# Patient Record
Sex: Female | Born: 1970 | Race: Black or African American | Hispanic: No | Marital: Single | Smoking: Never smoker
Health system: Southern US, Community
[De-identification: ages and names within clinical notes are randomized; demographics above are authoritative.]

## PROBLEM LIST (undated history)

## (undated) DIAGNOSIS — I471 Supraventricular tachycardia, unspecified: Secondary | ICD-10-CM

## (undated) DIAGNOSIS — I639 Cerebral infarction, unspecified: Secondary | ICD-10-CM

## (undated) DIAGNOSIS — K5790 Diverticulosis of intestine, part unspecified, without perforation or abscess without bleeding: Secondary | ICD-10-CM

## (undated) DIAGNOSIS — F419 Anxiety disorder, unspecified: Secondary | ICD-10-CM

## (undated) DIAGNOSIS — R002 Palpitations: Secondary | ICD-10-CM

## (undated) DIAGNOSIS — E210 Primary hyperparathyroidism: Secondary | ICD-10-CM

## (undated) DIAGNOSIS — I2510 Atherosclerotic heart disease of native coronary artery without angina pectoris: Secondary | ICD-10-CM

## (undated) DIAGNOSIS — D259 Leiomyoma of uterus, unspecified: Secondary | ICD-10-CM

## (undated) DIAGNOSIS — K8020 Calculus of gallbladder without cholecystitis without obstruction: Secondary | ICD-10-CM

## (undated) DIAGNOSIS — I209 Angina pectoris, unspecified: Secondary | ICD-10-CM

## (undated) DIAGNOSIS — D649 Anemia, unspecified: Secondary | ICD-10-CM

## (undated) DIAGNOSIS — N184 Chronic kidney disease, stage 4 (severe): Secondary | ICD-10-CM

## (undated) DIAGNOSIS — I517 Cardiomegaly: Secondary | ICD-10-CM

## (undated) DIAGNOSIS — N185 Chronic kidney disease, stage 5: Secondary | ICD-10-CM

## (undated) DIAGNOSIS — K219 Gastro-esophageal reflux disease without esophagitis: Secondary | ICD-10-CM

## (undated) DIAGNOSIS — K429 Umbilical hernia without obstruction or gangrene: Secondary | ICD-10-CM

## (undated) DIAGNOSIS — R0609 Other forms of dyspnea: Secondary | ICD-10-CM

## (undated) DIAGNOSIS — I219 Acute myocardial infarction, unspecified: Secondary | ICD-10-CM

## (undated) DIAGNOSIS — G4733 Obstructive sleep apnea (adult) (pediatric): Secondary | ICD-10-CM

## (undated) DIAGNOSIS — N289 Disorder of kidney and ureter, unspecified: Secondary | ICD-10-CM

## (undated) DIAGNOSIS — M6208 Separation of muscle (nontraumatic), other site: Secondary | ICD-10-CM

## (undated) DIAGNOSIS — Z7982 Long term (current) use of aspirin: Secondary | ICD-10-CM

## (undated) DIAGNOSIS — I10 Essential (primary) hypertension: Secondary | ICD-10-CM

## (undated) DIAGNOSIS — M1990 Unspecified osteoarthritis, unspecified site: Secondary | ICD-10-CM

## (undated) HISTORY — PX: ROTATOR CUFF REPAIR: SHX139

## (undated) HISTORY — PX: PARATHYROIDECTOMY: SHX19

---

## 2000-04-27 ENCOUNTER — Encounter: Payer: Self-pay | Admitting: Internal Medicine

## 2004-09-13 ENCOUNTER — Ambulatory Visit: Payer: Self-pay | Admitting: Internal Medicine

## 2004-09-20 ENCOUNTER — Ambulatory Visit: Payer: Self-pay | Admitting: Internal Medicine

## 2004-10-07 ENCOUNTER — Ambulatory Visit: Payer: Self-pay | Admitting: Internal Medicine

## 2004-10-19 ENCOUNTER — Other Ambulatory Visit: Admission: RE | Admit: 2004-10-19 | Discharge: 2004-10-19 | Payer: Self-pay | Admitting: Obstetrics & Gynecology

## 2004-10-20 ENCOUNTER — Other Ambulatory Visit: Admission: RE | Admit: 2004-10-20 | Discharge: 2004-10-20 | Payer: Self-pay | Admitting: Obstetrics & Gynecology

## 2004-10-26 ENCOUNTER — Ambulatory Visit: Payer: Self-pay | Admitting: Internal Medicine

## 2004-12-28 ENCOUNTER — Ambulatory Visit: Payer: Self-pay | Admitting: Internal Medicine

## 2004-12-30 ENCOUNTER — Ambulatory Visit: Payer: Self-pay | Admitting: Internal Medicine

## 2005-01-04 ENCOUNTER — Ambulatory Visit: Payer: Self-pay | Admitting: Internal Medicine

## 2005-02-09 ENCOUNTER — Ambulatory Visit: Payer: Self-pay | Admitting: Internal Medicine

## 2005-08-18 ENCOUNTER — Ambulatory Visit: Payer: Self-pay | Admitting: Endocrinology

## 2005-09-21 ENCOUNTER — Ambulatory Visit: Payer: Self-pay | Admitting: Internal Medicine

## 2005-09-27 ENCOUNTER — Ambulatory Visit: Payer: Self-pay | Admitting: Internal Medicine

## 2005-09-28 ENCOUNTER — Encounter (HOSPITAL_COMMUNITY): Admission: RE | Admit: 2005-09-28 | Discharge: 2005-12-27 | Payer: Self-pay | Admitting: Internal Medicine

## 2005-10-25 ENCOUNTER — Ambulatory Visit: Payer: Self-pay | Admitting: Internal Medicine

## 2005-12-07 ENCOUNTER — Ambulatory Visit: Payer: Self-pay | Admitting: Internal Medicine

## 2006-01-12 ENCOUNTER — Ambulatory Visit: Payer: Self-pay | Admitting: Internal Medicine

## 2006-11-19 LAB — CONVERTED CEMR LAB: Pap Smear: NORMAL

## 2006-11-24 ENCOUNTER — Encounter: Payer: Self-pay | Admitting: Internal Medicine

## 2006-11-24 DIAGNOSIS — D509 Iron deficiency anemia, unspecified: Secondary | ICD-10-CM | POA: Insufficient documentation

## 2006-11-24 DIAGNOSIS — I10 Essential (primary) hypertension: Secondary | ICD-10-CM | POA: Insufficient documentation

## 2006-12-21 ENCOUNTER — Emergency Department (HOSPITAL_COMMUNITY): Admission: EM | Admit: 2006-12-21 | Discharge: 2006-12-21 | Payer: Self-pay | Admitting: Emergency Medicine

## 2007-01-08 ENCOUNTER — Ambulatory Visit: Payer: Self-pay | Admitting: Internal Medicine

## 2007-01-08 LAB — CONVERTED CEMR LAB
BUN: 9 mg/dL (ref 6–23)
Basophils Absolute: 0 10*3/uL (ref 0.0–0.1)
Calcium: 10 mg/dL (ref 8.4–10.5)
Chloride: 109 meq/L (ref 96–112)
Creatinine, Ser: 1.1 mg/dL (ref 0.4–1.2)
Eosinophils Absolute: 0.6 10*3/uL (ref 0.0–0.6)
GFR calc non Af Amer: 60 mL/min
HCT: 26 % — ABNORMAL LOW (ref 36.0–46.0)
Hgb A1c MFr Bld: 5.4 % (ref 4.6–6.0)
MCHC: 30.7 g/dL (ref 30.0–36.0)
MCV: 63.7 fL — ABNORMAL LOW (ref 78.0–100.0)
Neutrophils Relative %: 50.2 % (ref 43.0–77.0)
Platelets: 541 10*3/uL — ABNORMAL HIGH (ref 150–400)
RBC: 4.08 M/uL (ref 3.87–5.11)
RDW: 22.7 % — ABNORMAL HIGH (ref 11.5–14.6)
Sodium: 147 meq/L — ABNORMAL HIGH (ref 135–145)
TSH: 0.38 microintl units/mL (ref 0.35–5.50)

## 2007-02-15 ENCOUNTER — Ambulatory Visit: Payer: Self-pay | Admitting: Internal Medicine

## 2007-02-15 LAB — CONVERTED CEMR LAB
Basophils Absolute: 0.1 10*3/uL (ref 0.0–0.1)
Basophils Relative: 0.9 % (ref 0.0–1.0)
CO2: 31 meq/L (ref 19–32)
Chloride: 112 meq/L (ref 96–112)
Creatinine, Ser: 1.2 mg/dL (ref 0.4–1.2)
HCT: 26 % — ABNORMAL LOW (ref 36.0–46.0)
Hemoglobin: 7.9 g/dL — ABNORMAL LOW (ref 12.0–15.0)
Monocytes Absolute: 0.6 10*3/uL (ref 0.2–0.7)
Neutrophils Relative %: 61.2 % (ref 43.0–77.0)
Potassium: 4.5 meq/L (ref 3.5–5.1)
RBC: 4.1 M/uL (ref 3.87–5.11)
RDW: 21.3 % — ABNORMAL HIGH (ref 11.5–14.6)
Sodium: 147 meq/L — ABNORMAL HIGH (ref 135–145)
WBC: 8.3 10*3/uL (ref 4.5–10.5)

## 2007-02-26 ENCOUNTER — Encounter (HOSPITAL_COMMUNITY): Admission: RE | Admit: 2007-02-26 | Discharge: 2007-05-10 | Payer: Self-pay | Admitting: Internal Medicine

## 2007-03-07 ENCOUNTER — Encounter (INDEPENDENT_AMBULATORY_CARE_PROVIDER_SITE_OTHER): Payer: Self-pay | Admitting: *Deleted

## 2007-04-12 ENCOUNTER — Ambulatory Visit: Payer: Self-pay | Admitting: Internal Medicine

## 2007-04-12 DIAGNOSIS — R05 Cough: Secondary | ICD-10-CM | POA: Insufficient documentation

## 2007-04-12 DIAGNOSIS — R059 Cough, unspecified: Secondary | ICD-10-CM | POA: Insufficient documentation

## 2007-04-15 LAB — CONVERTED CEMR LAB
Basophils Relative: 0.4 % (ref 0.0–1.0)
CO2: 30 meq/L (ref 19–32)
Eosinophils Absolute: 0.4 10*3/uL (ref 0.0–0.6)
Eosinophils Relative: 6.6 % — ABNORMAL HIGH (ref 0.0–5.0)
GFR calc Af Amer: 60 mL/min
GFR calc non Af Amer: 49 mL/min
Glucose, Bld: 90 mg/dL (ref 70–99)
Hemoglobin: 10 g/dL — ABNORMAL LOW (ref 12.0–15.0)
Lymphocytes Relative: 32.5 % (ref 12.0–46.0)
MCV: 67.5 fL — ABNORMAL LOW (ref 78.0–100.0)
Monocytes Absolute: 0.7 10*3/uL (ref 0.2–0.7)
Monocytes Relative: 10.8 % (ref 3.0–11.0)
Neutro Abs: 3.3 10*3/uL (ref 1.4–7.7)
Platelets: 452 10*3/uL — ABNORMAL HIGH (ref 150–400)
Potassium: 4.3 meq/L (ref 3.5–5.1)
WBC: 6.5 10*3/uL (ref 4.5–10.5)

## 2007-05-28 ENCOUNTER — Ambulatory Visit: Payer: Self-pay | Admitting: Internal Medicine

## 2007-05-28 DIAGNOSIS — N926 Irregular menstruation, unspecified: Secondary | ICD-10-CM | POA: Insufficient documentation

## 2007-05-29 LAB — CONVERTED CEMR LAB
Basophils Absolute: 0 10*3/uL (ref 0.0–0.1)
Basophils Relative: 0.3 % (ref 0.0–1.0)
MCHC: 30.8 g/dL (ref 30.0–36.0)
Monocytes Relative: 8.3 % (ref 3.0–11.0)
Platelets: 422 10*3/uL — ABNORMAL HIGH (ref 150–400)
RBC: 4.49 M/uL (ref 3.87–5.11)
RDW: 23.1 % — ABNORMAL HIGH (ref 11.5–14.6)
hCG, Beta Chain, Quant, S: <0.5 milliintl units/mL

## 2007-06-04 ENCOUNTER — Encounter (INDEPENDENT_AMBULATORY_CARE_PROVIDER_SITE_OTHER): Payer: Self-pay | Admitting: *Deleted

## 2007-07-08 ENCOUNTER — Telehealth (INDEPENDENT_AMBULATORY_CARE_PROVIDER_SITE_OTHER): Payer: Self-pay | Admitting: *Deleted

## 2007-10-15 ENCOUNTER — Observation Stay: Payer: Self-pay

## 2007-10-15 ENCOUNTER — Other Ambulatory Visit: Payer: Self-pay

## 2007-10-15 ENCOUNTER — Emergency Department: Payer: Self-pay | Admitting: Emergency Medicine

## 2008-06-29 ENCOUNTER — Ambulatory Visit: Payer: Self-pay | Admitting: Internal Medicine

## 2008-07-25 ENCOUNTER — Emergency Department: Payer: Self-pay | Admitting: Emergency Medicine

## 2008-07-30 ENCOUNTER — Ambulatory Visit: Payer: Self-pay | Admitting: Internal Medicine

## 2008-08-06 ENCOUNTER — Ambulatory Visit: Payer: Self-pay | Admitting: Internal Medicine

## 2008-08-29 ENCOUNTER — Ambulatory Visit: Payer: Self-pay | Admitting: Internal Medicine

## 2009-09-02 ENCOUNTER — Ambulatory Visit: Payer: Self-pay | Admitting: Gastroenterology

## 2009-11-17 ENCOUNTER — Ambulatory Visit: Payer: Self-pay | Admitting: Gastroenterology

## 2009-11-21 LAB — PATHOLOGY REPORT

## 2010-03-22 ENCOUNTER — Emergency Department: Payer: Self-pay

## 2011-04-04 ENCOUNTER — Ambulatory Visit: Payer: Self-pay

## 2012-06-18 ENCOUNTER — Ambulatory Visit: Payer: Self-pay

## 2012-06-26 ENCOUNTER — Ambulatory Visit: Payer: Self-pay

## 2013-11-10 ENCOUNTER — Ambulatory Visit: Payer: Self-pay

## 2015-09-13 ENCOUNTER — Encounter: Payer: Self-pay | Admitting: Medical Oncology

## 2015-09-13 ENCOUNTER — Observation Stay
Admission: EM | Admit: 2015-09-13 | Discharge: 2015-09-14 | Disposition: A | Discharge status: Home / Self Care | Payer: BLUE CROSS/BLUE SHIELD | Attending: Obstetrics and Gynecology | Admitting: Obstetrics and Gynecology

## 2015-09-13 DIAGNOSIS — N939 Abnormal uterine and vaginal bleeding, unspecified: Secondary | ICD-10-CM

## 2015-09-13 DIAGNOSIS — N946 Dysmenorrhea, unspecified: Secondary | ICD-10-CM | POA: Diagnosis not present

## 2015-09-13 DIAGNOSIS — D649 Anemia, unspecified: Secondary | ICD-10-CM | POA: Diagnosis present

## 2015-09-13 DIAGNOSIS — D509 Iron deficiency anemia, unspecified: Secondary | ICD-10-CM | POA: Diagnosis not present

## 2015-09-13 DIAGNOSIS — R05 Cough: Secondary | ICD-10-CM | POA: Diagnosis not present

## 2015-09-13 DIAGNOSIS — Z888 Allergy status to other drugs, medicaments and biological substances status: Secondary | ICD-10-CM | POA: Diagnosis not present

## 2015-09-13 DIAGNOSIS — Z6841 Body Mass Index (BMI) 40.0 and over, adult: Secondary | ICD-10-CM | POA: Insufficient documentation

## 2015-09-13 DIAGNOSIS — I10 Essential (primary) hypertension: Secondary | ICD-10-CM | POA: Insufficient documentation

## 2015-09-13 DIAGNOSIS — N920 Excessive and frequent menstruation with regular cycle: Principal | ICD-10-CM | POA: Insufficient documentation

## 2015-09-13 DIAGNOSIS — Z91013 Allergy to seafood: Secondary | ICD-10-CM | POA: Insufficient documentation

## 2015-09-13 DIAGNOSIS — D259 Leiomyoma of uterus, unspecified: Secondary | ICD-10-CM | POA: Diagnosis not present

## 2015-09-13 HISTORY — DX: Anemia, unspecified: D64.9

## 2015-09-13 HISTORY — DX: Essential (primary) hypertension: I10

## 2015-09-13 LAB — CBC
HCT: 22.2 % — ABNORMAL LOW (ref 35.0–47.0)
Hemoglobin: 6.4 g/dL — ABNORMAL LOW (ref 12.0–16.0)
MCH: 16.3 pg — ABNORMAL LOW (ref 26.0–34.0)
MCHC: 28.8 g/dL — ABNORMAL LOW (ref 32.0–36.0)
MCV: 56.5 fL — AB (ref 80.0–100.0)
PLATELETS: 418 10*3/uL (ref 150–440)
RBC: 3.93 MIL/uL (ref 3.80–5.20)
RDW: 22.6 % — AB (ref 11.5–14.5)
WBC: 5.8 10*3/uL (ref 3.6–11.0)

## 2015-09-13 LAB — URINALYSIS COMPLETE WITH MICROSCOPIC (ARMC ONLY)
Bilirubin Urine: NEGATIVE
Glucose, UA: NEGATIVE mg/dL
Ketones, ur: NEGATIVE mg/dL
LEUKOCYTES UA: NEGATIVE
NITRITE: NEGATIVE
PH: 5 (ref 5.0–8.0)
PROTEIN: 100 mg/dL — AB
SPECIFIC GRAVITY, URINE: 1.019 (ref 1.005–1.030)

## 2015-09-13 LAB — COMPREHENSIVE METABOLIC PANEL
ALBUMIN: 3.1 g/dL — AB (ref 3.5–5.0)
ALK PHOS: 91 U/L (ref 38–126)
ALT: 8 U/L — ABNORMAL LOW (ref 14–54)
ANION GAP: 6 (ref 5–15)
AST: 12 U/L — ABNORMAL LOW (ref 15–41)
BUN: 10 mg/dL (ref 6–20)
CALCIUM: 9.4 mg/dL (ref 8.9–10.3)
CO2: 28 mmol/L (ref 22–32)
Chloride: 102 mmol/L (ref 101–111)
Creatinine, Ser: 1.39 mg/dL — ABNORMAL HIGH (ref 0.44–1.00)
GFR calc non Af Amer: 45 mL/min — ABNORMAL LOW (ref >60)
GFR, EST AFRICAN AMERICAN: 53 mL/min — AB (ref >60)
GLUCOSE: 91 mg/dL (ref 65–99)
POTASSIUM: 3.1 mmol/L — AB (ref 3.5–5.1)
SODIUM: 136 mmol/L (ref 135–145)
TOTAL PROTEIN: 7.5 g/dL (ref 6.5–8.1)
Total Bilirubin: 0.1 mg/dL — ABNORMAL LOW (ref 0.3–1.2)

## 2015-09-13 LAB — PREPARE RBC (CROSSMATCH)

## 2015-09-13 LAB — POCT PREGNANCY, URINE: PREG TEST UR: NEGATIVE

## 2015-09-13 LAB — PROTIME-INR
INR: 1.07
PROTHROMBIN TIME: 14.1 s (ref 11.4–15.0)

## 2015-09-13 LAB — ABO/RH: ABO/RH(D): O POS

## 2015-09-13 MED ORDER — ACETAMINOPHEN 500 MG PO TABS
500.0000 mg | ORAL_TABLET | ORAL | Status: DC | PRN
  Administered 2015-09-13 – 2015-09-14 (×2): 500 mg via ORAL
  Filled 2015-09-13 (×2): qty 1

## 2015-09-13 MED ORDER — SODIUM CHLORIDE 0.9 % IV SOLN
10.0000 mL/h | Freq: Once | INTRAVENOUS | Status: AC
  Administered 2015-09-13: 10 mL/h via INTRAVENOUS

## 2015-09-13 MED ORDER — LACTATED RINGERS IV SOLN: INTRAVENOUS | Status: DC

## 2015-09-13 MED ORDER — ONDANSETRON HCL 4 MG PO TABS: 4.0000 mg | ORAL_TABLET | Freq: Four times a day (QID) | ORAL | Status: DC | PRN

## 2015-09-13 MED ORDER — NORGESTREL-ETHINYL ESTRADIOL 0.5-50 MG-MCG PO TABS
1.0000 | ORAL_TABLET | Freq: Three times a day (TID) | ORAL | Status: DC
  Administered 2015-09-13 – 2015-09-14 (×3): 1 via ORAL
  Filled 2015-09-13 (×5): qty 1

## 2015-09-13 MED ORDER — METOPROLOL TARTRATE 25 MG PO TABS
25.0000 mg | ORAL_TABLET | Freq: Two times a day (BID) | ORAL | Status: DC
  Administered 2015-09-13 – 2015-09-14 (×2): 25 mg via ORAL
  Filled 2015-09-13 (×2): qty 1

## 2015-09-13 MED ORDER — ONDANSETRON HCL 4 MG/2ML IJ SOLN: 4.0000 mg | Freq: Four times a day (QID) | INTRAMUSCULAR | Status: DC | PRN

## 2015-09-13 MED ORDER — LOSARTAN POTASSIUM 50 MG PO TABS
100.0000 mg | ORAL_TABLET | Freq: Every day | ORAL | Status: DC
  Administered 2015-09-14: 100 mg via ORAL
  Filled 2015-09-13 (×2): qty 2

## 2015-09-13 MED ORDER — PRENATAL MULTIVITAMIN CH
1.0000 | ORAL_TABLET | Freq: Every day | ORAL | Status: DC
  Administered 2015-09-14: 1 via ORAL
  Filled 2015-09-13: qty 1

## 2015-09-13 MED ORDER — DIPHENHYDRAMINE HCL 25 MG PO CAPS
25.0000 mg | ORAL_CAPSULE | ORAL | Status: DC | PRN
  Administered 2015-09-13 – 2015-09-14 (×2): 25 mg via ORAL
  Filled 2015-09-13 (×2): qty 1

## 2015-09-13 NOTE — ED Notes (Signed)
Pt given meal tray per okay from MD Mcshane, sitting up in bed eating and tolerating well, NAD noted at this time, will continue to monitor.

## 2015-09-13 NOTE — ED Provider Notes (Signed)
[facility] Emergency Department Provider Note  ____________________________________________   I have reviewed the triage vital signs and the nursing notes.   HISTORY  Chief Complaint Vaginal Bleeding    HPI [name] is a 45 y.o. female presents today with vaginal bleeding. Patient has a history of heavy vaginal bleeding and transfusions in the past, she believes the most recent time was 2009. She usually has periods once a month and her last missed her period was at the beginning of May. They usually last for 3 days but are very heavy. She states that atypically, she began to have vaginal bleeding 2 or 3 days ago and last night it was heavy with gushes of blood, bleeding continues with passage of clots. Has used 4 pads so far today but also feels somewhat lightheaded after her heavy bleeding last night. Patient has a history of iron deficiency anemia, irregular menses in the past      Past Medical History  Diagnosis Date  . Hypertension   . Anemia     Patient Active Problem List   Diagnosis Date Noted  . IRREGULAR MENSES 05/28/2007  . COUGH 04/12/2007  . OBESITY, MORBID 11/24/2006  . ANEMIA-IRON DEFICIENCY 11/24/2006  . HYPERTENSION 11/24/2006    History reviewed. No pertinent past surgical history.  No current outpatient prescriptions on file.  Allergies Ace inhibitors and Latex  No family history on file.  Social History Social History  Substance Use Topics  . Smoking status: Never Smoker   . Smokeless tobacco: None  . Alcohol Use: None    Review of Systems Constitutional: No fever/chills Eyes: No visual changes. ENT: No sore throat. No stiff neck no neck pain Cardiovascular: Denies chest pain. Respiratory: Denies shortness of breath. Gastrointestinal:   no vomiting.  No diarrhea.  No constipation. Genitourinary: Negative for dysuria. Musculoskeletal: Negative lower extremity swelling Skin: Negative for  rash. Neurological: Negative for headaches, focal weakness or numbness. 10-point ROS otherwise negative.  ____________________________________________   PHYSICAL EXAM:  VITAL SIGNS: ED Triage Vitals  Enc Vitals Group     BP 09/13/15 1708 153/90 mmHg     Pulse Rate 09/13/15 1708 100     Resp 09/13/15 1708 15     Temp 09/13/15 1708 99 F (37.2 C)     Temp Source 09/13/15 1708 Oral     SpO2 09/13/15 1708 100 %     Weight 09/13/15 1708 250 lb (113.399 kg)     Height 09/13/15 1708 5\' 3"  (1.6 m)     Head Cir --      Peak Flow --      Pain Score 09/13/15 1717 2     Pain Loc --      Pain Edu? --      Excl. in [place]? --     Constitutional: Alert and oriented. Well appearing and in no acute distress. Eyes: Conjunctivae are Pale. PERRL. EOMI. Head: Atraumatic. Nose: No congestion/rhinnorhea. Mouth/Throat: Mucous membranes are moist.  Oropharynx non-erythematous. Neck: No stridor.   Nontender with no meningismus Cardiovascular: Normal rate, regular rhythm. Grossly normal heart sounds.  Good peripheral circulation. Respiratory: Normal respiratory effort.  No retractions. Lungs CTAB. Abdominal: Soft and nontender. No distention. No guarding no rebound Back:  There is no focal tenderness or step off there is no midline tenderness there are no lesions noted. there is no CVA tenderness Pelvic exam: Female nurse chaperone present, no external lesions noted, physiologic vaginal discharge noted with no purulent discharge, no cervical motion  tenderness, no adnexal tenderness or mass, there is no significant uterine tenderness or mass. Active moderate vaginal bleeding noted with dark blood Musculoskeletal: No lower extremity tenderness. No joint effusions, no DVT signs strong distal pulses no edema Neurologic:  Normal speech and language. No gross focal neurologic deficits are appreciated.  Skin:  Skin is warm, dry and intact. No rash noted. Psychiatric: Mood and affect are normal. Speech and  behavior are normal.  ____________________________________________   LABS (all labs ordered are listed, but only abnormal results are displayed)  Labs Reviewed  COMPREHENSIVE METABOLIC PANEL - Abnormal; Notable for the following:    Potassium 3.1 (*)    Creatinine, Ser 1.39 (*)    Albumin 3.1 (*)    AST 12 (*)    ALT 8 (*)    Total Bilirubin 0.1 (*)    GFR calc non Af Amer 45 (*)    GFR calc Af Amer 53 (*)    All other components within normal limits  CBC - Abnormal; Notable for the following:    Hemoglobin 6.4 (*)    HCT 22.2 (*)    MCV 56.5 (*)    MCH 16.3 (*)    MCHC 28.8 (*)    RDW 22.6 (*)    All other components within normal limits  URINALYSIS COMPLETEWITH MICROSCOPIC ([facility] ONLY) - Abnormal; Notable for the following:    Bacteria, UA RARE (*)    Squamous Epithelial / LPF 0-5 (*)    All other components within normal limits  PROTIME-INR  POC URINE PREG, ED  POCT PREGNANCY, URINE  TYPE AND SCREEN  ABO/RH   ____________________________________________  EKG  I personally interpreted any EKGs ordered by me or triage  ____________________________________________  [id number]  I reviewed any imaging ordered by me or triage that were performed during my shift and, if possible, patient and/or family made aware of any abnormal findings. ____________________________________________   PROCEDURES  Procedure(s) performed: None  Critical Care performed: None  ____________________________________________   INITIAL IMPRESSION / ASSESSMENT AND PLAN / ED COURSE  Pertinent labs & imaging results that were available during my care of the patient were reviewed by me and considered in my medical decision making (see chart for details).  Discussed with Dr. Ouida Sills, who was kind enough to take the consult. He will admit the patient for dysfunction uterine bleeding and anemia. ____________________________________________   FINAL CLINICAL IMPRESSION(S) / ED  DIAGNOSES  Final diagnoses:  None      This chart was dictated using voice recognition software.  Despite best efforts to proofread,  errors can occur which can change meaning.     Schuyler Amor, MD 09/13/15 [phone]

## 2015-09-13 NOTE — ED Notes (Signed)
Pt reports she had her period last month until the 3rd of may, 2 days ago pt reports she started bleeding again. Pt reports today she has saturated through 3 pads. Pt reports hx of uterine fibroids.

## 2015-09-13 NOTE — ED Notes (Signed)
OB MD at bedside at this time

## 2015-09-13 NOTE — ED Notes (Signed)
Pt reports having food poisoning two weeks ago and reports symptoms regarding decreased appetite and abd pain have not decreased since that time.

## 2015-09-14 ENCOUNTER — Observation Stay: Payer: BLUE CROSS/BLUE SHIELD

## 2015-09-14 LAB — BASIC METABOLIC PANEL
Anion gap: 5 (ref 5–15)
BUN: 14 mg/dL (ref 6–20)
CALCIUM: 9.4 mg/dL (ref 8.9–10.3)
CHLORIDE: 104 mmol/L (ref 101–111)
CO2: 29 mmol/L (ref 22–32)
CREATININE: 1.36 mg/dL — AB (ref 0.44–1.00)
GFR calc Af Amer: 54 mL/min — ABNORMAL LOW (ref >60)
GFR calc non Af Amer: 47 mL/min — ABNORMAL LOW (ref >60)
GLUCOSE: 91 mg/dL (ref 65–99)
Potassium: 3.2 mmol/L — ABNORMAL LOW (ref 3.5–5.1)
Sodium: 138 mmol/L (ref 135–145)

## 2015-09-14 LAB — CBC
HCT: 27.4 % — ABNORMAL LOW (ref 35.0–47.0)
HCT: 28.1 % — ABNORMAL LOW (ref 35.0–47.0)
HEMOGLOBIN: 8.7 g/dL — AB (ref 12.0–16.0)
Hemoglobin: 8.5 g/dL — ABNORMAL LOW (ref 12.0–16.0)
MCH: 19.6 pg — ABNORMAL LOW (ref 26.0–34.0)
MCH: 19.4 pg — AB (ref 26.0–34.0)
MCHC: 30.8 g/dL — ABNORMAL LOW (ref 32.0–36.0)
MCHC: 31 g/dL — ABNORMAL LOW (ref 32.0–36.0)
MCV: 63.7 fL — ABNORMAL LOW (ref 80.0–100.0)
MCV: 62.5 fL — AB (ref 80.0–100.0)
PLATELETS: 375 10*3/uL (ref 150–440)
Platelets: 380 10*3/uL (ref 150–440)
RBC: 4.31 MIL/uL (ref 3.80–5.20)
RBC: 4.49 MIL/uL (ref 3.80–5.20)
RDW: 27.6 % — ABNORMAL HIGH (ref 11.5–14.5)
RDW: 27.5 % — ABNORMAL HIGH (ref 11.5–14.5)
WBC: 7.4 10*3/uL (ref 3.6–11.0)
WBC: 8.5 10*3/uL (ref 3.6–11.0)

## 2015-09-14 MED ORDER — NORETHINDRONE-ETH ESTRADIOL 1-35 MG-MCG PO TABS: 1.0000 | ORAL_TABLET | Freq: Every day | ORAL | Status: DC

## 2015-09-14 MED ORDER — FERROUS SULFATE 325 (65 FE) MG PO TABS: 325.0000 mg | ORAL_TABLET | Freq: Two times a day (BID) | ORAL | Status: DC

## 2015-09-14 MED ORDER — LOSARTAN POTASSIUM-HCTZ 100-25 MG PO TABS: 1.0000 | ORAL_TABLET | Freq: Every day | ORAL | Status: DC

## 2015-09-14 MED ORDER — METOPROLOL TARTRATE 25 MG PO TABS: 25.0000 mg | ORAL_TABLET | Freq: Two times a day (BID) | ORAL | Status: DC

## 2015-09-14 MED ORDER — DOCUSATE SODIUM 100 MG PO CAPS: 100.0000 mg | ORAL_CAPSULE | Freq: Two times a day (BID) | ORAL | Status: DC

## 2015-09-14 MED ORDER — ONDANSETRON HCL 4 MG PO TABS: 4.0000 mg | ORAL_TABLET | Freq: Four times a day (QID) | ORAL | Status: DC | PRN

## 2015-09-14 NOTE — Progress Notes (Signed)
Spoke to Dr. Ouida Sills to report CBC and Ultrasound results---he is coming in to see patient this afternoon.

## 2015-09-14 NOTE — H&P (Signed)
[name], [name] NO.:  [id number]  MEDICAL RECORD NO.:  [id number]  LOCATION:  [id number]                         FACILITY:  [facility]  PHYSICIAN:  [name], MDDATE OF BIRTH:  1971/03/31  DATE OF ADMISSION:  09/13/2015 DATE OF DISCHARGE:                            HISTORY AND PHYSICAL   HISTORY OF PRESENT ILLNESS:  This is a 45 year old, gravida 0, patient presented to the emergency department today with heavy vaginal bleeding and significant dysmenorrhea.  The patient has a long history of menorrhagia with monthly cycles and bleeds for 3-4 days with excessive bleeding, passing large clots.  The patient has been worked up back in 2006 through 2008, for menorrhagia and records not available.  She has received IV iron in the past for her heavy menstruation.  Dr. Bayard Males- [place] [facility] was the last gynecologist in [place] to evaluate her.  Again, no records available.  Laboratory studies in the emergency department, hemoglobin of 6.4, hematocrit 22.2%.  The patient is not sexually active and is not using any form of contraception.  PAST MEDICAL HISTORY: 1. Hypertension uncontrolled.  The patient states that she has been     written for losartan 100 mg daily and metoprolol 25 mg twice a day     in the past.  Currently, not taking these medications. 2. Morbid obesity.  PAST SURGICAL HISTORY:  Colonoscopy and endoscopy.  REVIEW OF SYSTEMS:  Unremarkable except for: GYNECOLOGIC:  Menorrhagia, dysmenorrhea. GENERAL:  Morbid obesity.  SOCIAL HISTORY:  Does not smoke.  Does not drink.  ALLERGIES:  LISINOPRIL, LASIX, SHRIMP.  PHYSICAL EXAMINATION:  GENERAL:  A well-developed, well-nourished, morbidly obese, black female, no acute distress. VITAL SIGNS:  Blood pressure 179/89. LUNGS:  Clear to auscultation. CARDIOVASCULAR:  Regular rate and rhythm. ABDOMEN:  Soft, nontender. PELVIC:  Deferred.  ASSESSMENT: 1. Menorrhagia. 2. Severe anemia resulting from  menorrhagia. 3. Uncontrolled hypertension.  PLAN:  The patient will be admitted to [facility] and we will start 3 unit blood transfusion in the goal of trying to get her blood count above 30, so that she can be worked up for potential surgery as an outpatient.  The patient will be started on Ogestrel 0.5/50 one tablet 3 times a day, Zofran 4 mg oral or IV q.6 hours while taking the high-dose birth control pill.  The patient will undergo vaginal ultrasound tomorrow in the a.m. to give a better assessment of uterus and probable fibroid.  Repeat laboratory testing in the morning.          ______________________________ [name], MD     TS/MEDQ  D:  09/13/2015  T:  09/14/2015  Job:  [id number]

## 2015-09-14 NOTE — Progress Notes (Signed)
D/C order from MD.  Reviewed d/c instructions and prescriptions with patient and answered any questions.  Patient d/c home.

## 2015-09-14 NOTE — Discharge Instructions (Signed)
Take prescriptions as advised by Dr. Ouida Sills and keep your follow up appointment.  Call your doctors office if you have any questions or concerns.

## 2015-09-14 NOTE — Discharge Summary (Signed)
Physician Discharge Summary  Patient ID: [name] MRN: [id number] DOB/AGE: 1970-09-06 45 y.o.  Admit date: 09/13/2015 Discharge date: 09/14/2015  Admission Diagnoses:  Discharge Diagnoses:  Active Problems:   Severe anemia uncontrolled HTN   Discharged Condition: good  Hospital Course: pt admitted with heavy vaginal bleeding and severe anemia with HCT of 22 %. Admitted and received 3 unit PRBC transfusion . Post transfusion 28%   Consults: None  Significant Diagnostic Studies: radiology: FINDINGS:15 x 10 cm utx with multiple fibroids .  Treatments: 3 unit PRBC transfusion   Discharge Exam: Blood pressure 140/69, pulse 58, temperature 98.2 F (36.8 C), temperature source Oral, resp. rate 18, height 5\' 3"  (1.6 m), weight 250 lb (113.399 kg), last menstrual period 09/11/2015, SpO2 99 %. Resp: clear to auscultation bilaterally Cardio: regular rate and rhythm, S1, S2 normal, no murmur, click, rub or gallop GI: soft, non-tender; bowel sounds normal; no masses,  no organomegaly  Disposition: Final discharge disposition not confirmed  Pt will f/up at [facility] clinic for work up for hysterectomy   Discharge Instructions    Call MD for:  difficulty breathing, headache or visual disturbances    Complete by:  As directed      Call MD for:  extreme fatigue    Complete by:  As directed      Call MD for:  hives    Complete by:  As directed      Call MD for:  persistant dizziness or light-headedness    Complete by:  As directed      Call MD for:  persistant nausea and vomiting    Complete by:  As directed      Call MD for:  redness, tenderness, or signs of infection (pain, swelling, redness, odor or green/yellow discharge around incision site)    Complete by:  As directed      Call MD for:  severe uncontrolled pain    Complete by:  As directed      Call MD for:  temperature >100.4    Complete by:  As directed      Diet - low sodium heart healthy    Complete by:  As directed      Increase activity slowly    Complete by:  As directed             Medication List    TAKE these medications        docusate sodium 100 MG capsule  Commonly known as:  COLACE  Take 1 capsule (100 mg total) by mouth 2 (two) times daily.     ferrous sulfate 325 (65 FE) MG tablet  Take 325 mg by mouth 3 (three) times daily with meals.     ferrous sulfate 325 (65 FE) MG tablet  Commonly known as:  FERROUSUL  Take 1 tablet (325 mg total) by mouth 2 (two) times daily with a meal.     losartan-hydrochlorothiazide 100-25 MG tablet  Commonly known as:  HYZAAR  Take 1 tablet by mouth daily.     losartan-hydrochlorothiazide 100-25 MG tablet  Commonly known as:  HYZAAR  Take 1 tablet by mouth daily.     metoprolol tartrate 25 MG tablet  Commonly known as:  LOPRESSOR  Take 25 mg by mouth 2 (two) times daily.     metoprolol tartrate 25 MG tablet  Commonly known as:  LOPRESSOR  Take 1 tablet (25 mg total) by mouth 2 (two) times daily.     norethindrone-ethinyl estradiol 1/35 tablet  Commonly known as:  ORTHO-NOVUM 1/35 (28)  Take 1 tablet by mouth daily. 1 po tid x 3 days then 1 po bid x 2 days then qd . Dispose of the last 7 tabs and restart new pack     ondansetron 4 MG tablet  Commonly known as:  ZOFRAN  Take 1 tablet (4 mg total) by mouth every 6 (six) hours as needed for nausea.           Follow-up Information    Follow up with Madaleine Simmon, MD In 2 weeks.   Specialty:  Obstetrics and Gynecology   Why:  continued work up for menorrhagia and fibroid utx    Contact information:   [address] [place] [address] [phone]       Signed: [name] 09/14/2015, 4:38 PM

## 2015-09-16 LAB — TYPE AND SCREEN
ABO/RH(D): O POS
Antibody Screen: NEGATIVE
UNIT DIVISION: 0
UNIT DIVISION: 0
Unit division: 0

## 2015-09-30 ENCOUNTER — Other Ambulatory Visit: Payer: Self-pay | Admitting: Internal Medicine

## 2015-09-30 DIAGNOSIS — R1011 Right upper quadrant pain: Secondary | ICD-10-CM

## 2015-10-04 ENCOUNTER — Ambulatory Visit: Payer: BLUE CROSS/BLUE SHIELD

## 2016-06-14 ENCOUNTER — Encounter: Payer: Self-pay | Admitting: Emergency Medicine

## 2016-06-14 ENCOUNTER — Emergency Department
Admission: EM | Admit: 2016-06-14 | Discharge: 2016-06-14 | Disposition: A | Discharge status: Home / Self Care | Payer: Self-pay | Attending: Emergency Medicine | Admitting: Emergency Medicine

## 2016-06-14 ENCOUNTER — Emergency Department: Payer: Self-pay

## 2016-06-14 DIAGNOSIS — I10 Essential (primary) hypertension: Secondary | ICD-10-CM | POA: Insufficient documentation

## 2016-06-14 DIAGNOSIS — Z79899 Other long term (current) drug therapy: Secondary | ICD-10-CM | POA: Insufficient documentation

## 2016-06-14 DIAGNOSIS — N83299 Other ovarian cyst, unspecified side: Secondary | ICD-10-CM

## 2016-06-14 DIAGNOSIS — N83201 Unspecified ovarian cyst, right side: Secondary | ICD-10-CM | POA: Insufficient documentation

## 2016-06-14 LAB — URINALYSIS, COMPLETE (UACMP) WITH MICROSCOPIC
Bacteria, UA: NONE SEEN
Bilirubin Urine: NEGATIVE
GLUCOSE, UA: NEGATIVE mg/dL
Ketones, ur: NEGATIVE mg/dL
LEUKOCYTES UA: NEGATIVE
NITRITE: NEGATIVE
PH: 5 (ref 5.0–8.0)
Protein, ur: >=300 mg/dL — AB
SPECIFIC GRAVITY, URINE: 1.023 (ref 1.005–1.030)

## 2016-06-14 LAB — COMPREHENSIVE METABOLIC PANEL
ALBUMIN: 3.4 g/dL — AB (ref 3.5–5.0)
ALK PHOS: 65 U/L (ref 38–126)
ALT: 99 U/L — ABNORMAL HIGH (ref 14–54)
AST: 87 U/L — AB (ref 15–41)
Anion gap: 7 (ref 5–15)
BILIRUBIN TOTAL: 0.4 mg/dL (ref 0.3–1.2)
BUN: 18 mg/dL (ref 6–20)
CALCIUM: 10.2 mg/dL (ref 8.9–10.3)
CO2: 26 mmol/L (ref 22–32)
Chloride: 102 mmol/L (ref 101–111)
Creatinine, Ser: 1.7 mg/dL — ABNORMAL HIGH (ref 0.44–1.00)
GFR calc Af Amer: 41 mL/min — ABNORMAL LOW (ref >60)
GFR, EST NON AFRICAN AMERICAN: 35 mL/min — AB (ref >60)
GLUCOSE: 90 mg/dL (ref 65–99)
POTASSIUM: 3.7 mmol/L (ref 3.5–5.1)
Sodium: 135 mmol/L (ref 135–145)
Total Protein: 8.6 g/dL — ABNORMAL HIGH (ref 6.5–8.1)

## 2016-06-14 LAB — CBC
HCT: 32.3 % — ABNORMAL LOW (ref 35.0–47.0)
Hemoglobin: 9.6 g/dL — ABNORMAL LOW (ref 12.0–16.0)
MCH: 19.8 pg — ABNORMAL LOW (ref 26.0–34.0)
MCHC: 29.7 g/dL — AB (ref 32.0–36.0)
MCV: 66.8 fL — ABNORMAL LOW (ref 80.0–100.0)
PLATELETS: 423 10*3/uL (ref 150–440)
RBC: 4.84 MIL/uL (ref 3.80–5.20)
RDW: 22.5 % — AB (ref 11.5–14.5)
WBC: 8.6 10*3/uL (ref 3.6–11.0)

## 2016-06-14 LAB — POCT PREGNANCY, URINE: Preg Test, Ur: NEGATIVE

## 2016-06-14 LAB — TROPONIN I: Troponin I: 0.04 ng/mL (ref <0.03)

## 2016-06-14 LAB — LIPASE, BLOOD: Lipase: 31 U/L (ref 11–51)

## 2016-06-14 MED ORDER — SODIUM CHLORIDE 0.9 % IV BOLUS (SEPSIS)
1000.0000 mL | Freq: Once | INTRAVENOUS | Status: AC
  Administered 2016-06-14: 1000 mL via INTRAVENOUS

## 2016-06-14 MED ORDER — IOPAMIDOL (ISOVUE-300) INJECTION 61%
15.0000 mL | INTRAVENOUS | Status: AC
  Administered 2016-06-14: 30 mL via ORAL

## 2016-06-14 MED ORDER — CLONIDINE HCL 0.1 MG PO TABS
0.2000 mg | ORAL_TABLET | Freq: Once | ORAL | Status: AC
  Administered 2016-06-14: 0.2 mg via ORAL
  Filled 2016-06-14: qty 2

## 2016-06-14 MED ORDER — CLONIDINE HCL 0.2 MG PO TABS: 0.2000 mg | ORAL_TABLET | Freq: Two times a day (BID) | ORAL | 11 refills | Status: DC

## 2016-06-14 NOTE — Discharge Instructions (Signed)
Please return immediately if condition worsens. Please contact her primary physician or the physician you were given for referral. If you have any specialist physicians involved in her treatment and plan please also contact them. Thank you for using [facility]. ° °

## 2016-06-14 NOTE — ED Notes (Signed)
Pt taken to room 1, placed in hosp gown & on card monitor for further evaluation; report to Daiva Nakayama, RN, care nurse

## 2016-06-14 NOTE — ED Triage Notes (Signed)
Pt ambulatory to triage with steady gait, no distress noted. Pt c/o UQ abdominal pain, no N/V/D, x1 week. Pt sts "I feel like one of my organs is going to fall out."

## 2016-06-14 NOTE — ED Provider Notes (Addendum)
Time Seen: Approximately2043  I have reviewed the triage notes  Chief Complaint: Abdominal Pain   History of Present Illness: [name] is a 46 y.o. female *who presents with umbilical abdominal pain which she states is been increasing in intensity over the past week. She states she had what she felt was a stomach virus last week and had a brief course of nausea vomiting and diarrhea. She states that she had the abdominal pain at that time but she thought it might be related to a stomach virus. The nausea and vomiting component has decreased she states that she now feels constipated not had a normal bowel movement over the last 48 hours. She denies any fever at home and states she still passing gas. She has a history of chronic anemia. Denies any extensive past surgery on her abdomen. He denies any difficulty with back pain or urination.   Past Medical History:  Diagnosis Date  . Anemia   . Hypertension     Patient Active Problem List   Diagnosis Date Noted  . Severe anemia 09/13/2015  . IRREGULAR MENSES 05/28/2007  . COUGH 04/12/2007  . OBESITY, MORBID 11/24/2006  . ANEMIA-IRON DEFICIENCY 11/24/2006  . HYPERTENSION 11/24/2006    History reviewed. No pertinent surgical history.  History reviewed. No pertinent surgical history.  Current Outpatient Rx  . Order #: [id number] Class: Normal  . Order #: [id number] Class: Normal  . Order #: [id number] Class: Historical Med  . Order #: [id number] Class: Historical Med  . Order #: [id number] Class: Normal  . Order #: [id number] Class: Historical Med  . Order #: [id number] Class: Normal  . Order #: [id number] Class: Normal  . Order #: [id number] Class: Normal    Allergies:  Ace inhibitors and Latex  Family History: History reviewed. No pertinent family history.  Social History: Social History  Substance Use Topics  . Smoking status: Never Smoker  . Smokeless tobacco: Never Used  . Alcohol use No     Review of Systems:   10  point review of systems was performed and was otherwise negative:  Constitutional: No fever Eyes: No visual disturbances ENT: No sore throat, ear pain Cardiac: No chest pain Respiratory: No shortness of breath, wheezing, or stridor Abdomen: Patient points primarily to the periumbilical region. Mild pleuritic pain in the abdomen with deep inspiration Endocrine: No weight loss, No night sweats Extremities: No peripheral edema, cyanosis Skin: No rashes, easy bruising Neurologic: No focal weakness, trouble with speech or swollowing Urologic: No dysuria, Hematuria, or urinary frequency   Physical Exam:  ED Triage Vitals  Enc Vitals Group     BP 06/14/16 1940 (!) 151/99     Pulse Rate 06/14/16 1940 98     Resp 06/14/16 1940 16     Temp 06/14/16 1940 98.1 F (36.7 C)     Temp Source 06/14/16 1940 Oral     SpO2 06/14/16 1940 99 %     Weight 06/14/16 1941 250 lb (113.4 kg)     Height 06/14/16 1941 5\' 3"  (1.6 m)     Head Circumference --      Peak Flow --      Pain Score 06/14/16 2057 5     Pain Loc --      Pain Edu? --      Excl. in [place]? --     General: Awake , Alert , and Oriented times 3; GCS 15 Head: Normal cephalic , atraumatic Eyes: Pupils equal , round, reactive to light Nose/Throat: No nasal drainage, patent upper airway  without erythema or exudate.  Neck: Supple, Full range of motion, No anterior adenopathy or palpable thyroid masses Lungs: Clear to ascultation without wheezes , rhonchi, or rales Heart: Regular rate, regular rhythm without murmurs , gallops , or rubs Abdomen Morbidly obese with soft umbilical hernia. Hard expanded abdomen without focal peritoneal signs    Extremities: 2 plus symmetric pulses. No edema, clubbing or cyanosis Neurologic: normal ambulation, Motor symmetric without deficits, sensory intact Skin: warm, dry, no rashes   Labs:   All laboratory work was reviewed including any pertinent negatives or positives listed below:  Labs Reviewed   COMPREHENSIVE METABOLIC PANEL - Abnormal; Notable for the following:       Result Value   Creatinine, Ser 1.70 (*)    Total Protein 8.6 (*)    Albumin 3.4 (*)    AST 87 (*)    ALT 99 (*)    GFR calc non Af Amer 35 (*)    GFR calc Af Amer 41 (*)    All other components within normal limits  CBC - Abnormal; Notable for the following:    Hemoglobin 9.6 (*)    HCT 32.3 (*)    MCV 66.8 (*)    MCH 19.8 (*)    MCHC 29.7 (*)    RDW 22.5 (*)    All other components within normal limits  URINALYSIS, COMPLETE (UACMP) WITH MICROSCOPIC - Abnormal; Notable for the following:    Color, Urine AMBER (*)    APPearance HAZY (*)    Hgb urine dipstick LARGE (*)    Protein, ur >=300 (*)    Squamous Epithelial / LPF 0-5 (*)    All other components within normal limits  TROPONIN I - Abnormal; Notable for the following:    Troponin I 0.04 (*)    All other components within normal limits  LIPASE, BLOOD  TROPONIN I  POC URINE PREG, ED  POCT PREGNANCY, URINE  Patient has some renal insufficiency. The hemoglobin is low dose elevated compared to previous levels  EKG: * ED ECG REPORT I, Daymon Larsen, the attending physician, personally viewed and interpreted this ECG.  Date: 06/14/2016 EKG Time99** Rhythm: normal sinus rhythm QRS Axis: normal Intervals: normal ST/T Wave abnormalities: normal Conduction Disturbances: none Narrative Interpretation: unremarkable Left ventricular hypertrophy No acute ischemic changes   Radiology:  "Ct Abdomen Pelvis Wo Contrast  Result Date: 06/14/2016 CLINICAL DATA:  Acute onset of mid abdominal pain. Initial encounter. EXAM: CT ABDOMEN AND PELVIS WITHOUT CONTRAST TECHNIQUE: Multidetector CT imaging of the abdomen and pelvis was performed following the standard protocol without IV contrast. COMPARISON:  Pelvic ultrasound performed 09/14/2015 FINDINGS: Lower chest: The visualized lung bases are grossly clear. The heart is borderline enlarged. Hepatobiliary:  The liver is unremarkable in appearance. A large stone is noted within the gallbladder. Mild gallbladder wall thickening raises question for cholecystitis. Would correlate for any associated symptoms. The common bile duct remains normal in caliber. Pancreas: The pancreas is within normal limits. Spleen: The spleen is unremarkable in appearance. Adrenals/Urinary Tract: The adrenal glands are unremarkable in appearance. Scattered bilateral renal cysts are noted. The kidneys are otherwise grossly unremarkable, though difficult to fully assess without contrast. There is no evidence of hydronephrosis. No renal or ureteral stones are identified. No perinephric stranding is seen. Stomach/Bowel: The stomach is unremarkable in appearance. The small bowel is within normal limits. The appendix is not visualized; there is no evidence for appendicitis. Scattered diverticulosis is noted along the distal descending and proximal  sigmoid colon, without evidence of diverticulitis. The sigmoid colon is largely displaced by the patient's enlarged uterus. Vascular/Lymphatic: The abdominal aorta is unremarkable in appearance. The inferior vena cava is grossly unremarkable. No retroperitoneal lymphadenopathy is seen. No pelvic sidewall lymphadenopathy is identified. Reproductive: A markedly enlarged uterus is noted, with numerous large fibroids, measuring approximately 30 cm in length. This fills much of the abdomen, extending well above the umbilicus. A prominent 14.8 cm cystic structure at the right side of the uterus may arise from the right ovary, though this is difficult to fully assess. The left ovary is not well characterized. The bladder is mildly distended and grossly unremarkable. Other: No additional soft tissue abnormalities are seen. Musculoskeletal: No acute osseous abnormalities are identified. Facet disease is noted at the lower lumbar spine. The visualized musculature is unremarkable in appearance. IMPRESSION: 1. Markedly  enlarged uterus, with numerous large fibroids, measuring 30 cm in length. This fills much of the abdomen, extending well above the umbilicus. 2. Prominent 14.8 cm cystic structure at the right side of the uterus may arise from the right ovary, though this is difficult to fully assess. The could be further characterized with MRI of the abdomen and pelvis, as deemed clinically appropriate. 3. Cholelithiasis. Mild gallbladder wall thickening raises question for mild cholecystitis. Would correlate for any associated symptoms. 4. Borderline cardiomegaly. 5. Scattered bilateral renal cysts. 6. Scattered diverticulosis along the distal descending and proximal sigmoid colon, without evidence of diverticulitis. Electronically Signed   By: Garald Balding M.D.   On: 06/14/2016 23:01  "   I personally reviewed the radiologic studies   ED Course:  Patient's stay here was uneventful. She had eventual decrease in her blood pressure with oral clonidine and will be discharged with a prescription until she can touch base with her in for further control of her blood pressure. Patient's renal function seems to be slightly worse it may be related to her hypertension. She was given IV fluids here in the emergency department. Her troponin is slightly elevated which hasn't felt was more within a lab normal limits and I don't suspect ischemic cardiac pain at this time.  She also per her CAT scan had findings of a large cystic mass located around the area of the right ovary along with some fibroids of the uterus. Does not appear to have a bowel obstruction. There is mention on the reading of her CAT scan of cholelithiasis but there is no reproducible right upper quadrant abdominal pain. I felt this was unlikely to be acute cholecystitis Her white blood cell count is normal and her liver functions appeared overall to be within normal limits. She will need follow-up to further evaluate the mass would seem to be related to the right  ovary. I did touch base with her OB/GYN and discuss further outpatient evaluation.     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 " New Prescriptions   CLONIDINE (CATAPRES) 0.2 MG TABLET    Take 1 tablet (0.2 mg total) by mouth 2 (two) times daily.  "   Patient was advised to return immediately if condition worsens. Patient was advised to follow up with their primary care physician or other specialized physicians involved in their outpatient care.  The patient and/or family member/power of attorney had laboratory results reviewed at the bedside. All questions and concerns were addressed and appropriate discharge instructions were distributed by the nursing staff.            Daymon Larsen, MD 06/14/16 [place] Elric Tirado, MD 06/14/16 [phone]

## 2016-06-15 LAB — TROPONIN I: TROPONIN I: 0.04 ng/mL — AB (ref <0.03)

## 2016-06-15 NOTE — ED Provider Notes (Signed)
-----------------------------------------   12:36 AM on 06/15/2016 -----------------------------------------  Lab called with troponin 0.04. Reviewed patient's chart which indicates she was here for abdominal pain, found to have large cystic ovarian mass as well as elevated blood pressure, improved after clonidine. Initial troponin at 1940 was also 0.04. No change with repeat timed troponin 3 hours later.   Paulette Blanch, MD 06/15/16 [phone]

## 2016-07-29 ENCOUNTER — Emergency Department: Payer: Self-pay

## 2016-07-29 ENCOUNTER — Inpatient Hospital Stay
Admission: EM | Admit: 2016-07-29 | Discharge: 2016-08-02 | DRG: 281 | Disposition: A | Discharge status: Home / Self Care | Payer: BLUE CROSS/BLUE SHIELD | Attending: Internal Medicine | Admitting: Internal Medicine

## 2016-07-29 ENCOUNTER — Encounter: Payer: Self-pay | Admitting: Emergency Medicine

## 2016-07-29 DIAGNOSIS — D631 Anemia in chronic kidney disease: Secondary | ICD-10-CM | POA: Diagnosis present

## 2016-07-29 DIAGNOSIS — E876 Hypokalemia: Secondary | ICD-10-CM | POA: Diagnosis present

## 2016-07-29 DIAGNOSIS — Z79899 Other long term (current) drug therapy: Secondary | ICD-10-CM

## 2016-07-29 DIAGNOSIS — R000 Tachycardia, unspecified: Secondary | ICD-10-CM | POA: Diagnosis present

## 2016-07-29 DIAGNOSIS — Z91040 Latex allergy status: Secondary | ICD-10-CM

## 2016-07-29 DIAGNOSIS — I214 Non-ST elevation (NSTEMI) myocardial infarction: Secondary | ICD-10-CM

## 2016-07-29 DIAGNOSIS — E785 Hyperlipidemia, unspecified: Secondary | ICD-10-CM | POA: Diagnosis present

## 2016-07-29 DIAGNOSIS — N179 Acute kidney failure, unspecified: Secondary | ICD-10-CM | POA: Diagnosis present

## 2016-07-29 DIAGNOSIS — D500 Iron deficiency anemia secondary to blood loss (chronic): Secondary | ICD-10-CM

## 2016-07-29 DIAGNOSIS — Z7982 Long term (current) use of aspirin: Secondary | ICD-10-CM

## 2016-07-29 DIAGNOSIS — Z6841 Body Mass Index (BMI) 40.0 and over, adult: Secondary | ICD-10-CM

## 2016-07-29 DIAGNOSIS — D329 Benign neoplasm of meninges, unspecified: Secondary | ICD-10-CM

## 2016-07-29 DIAGNOSIS — I129 Hypertensive chronic kidney disease with stage 1 through stage 4 chronic kidney disease, or unspecified chronic kidney disease: Secondary | ICD-10-CM | POA: Diagnosis present

## 2016-07-29 DIAGNOSIS — N183 Chronic kidney disease, stage 3 (moderate): Secondary | ICD-10-CM | POA: Diagnosis present

## 2016-07-29 HISTORY — DX: Benign neoplasm of meninges, unspecified: D32.9

## 2016-07-29 HISTORY — DX: Non-ST elevation (NSTEMI) myocardial infarction: I21.4

## 2016-07-29 LAB — PROTIME-INR
INR: 0.89
Prothrombin Time: 12 seconds (ref 11.4–15.2)

## 2016-07-29 LAB — CBC WITH DIFFERENTIAL/PLATELET
BASOS ABS: 0.1 10*3/uL (ref 0–0.1)
Basophils Relative: 1 %
Eosinophils Absolute: 0.2 10*3/uL (ref 0–0.7)
Eosinophils Relative: 3 %
HEMATOCRIT: 31 % — AB (ref 35.0–47.0)
Hemoglobin: 9.5 g/dL — ABNORMAL LOW (ref 12.0–16.0)
LYMPHS PCT: 22 %
Lymphs Abs: 1.8 10*3/uL (ref 1.0–3.6)
MCH: 20.8 pg — ABNORMAL LOW (ref 26.0–34.0)
MCHC: 30.8 g/dL — ABNORMAL LOW (ref 32.0–36.0)
MCV: 67.6 fL — AB (ref 80.0–100.0)
MONO ABS: 0.7 10*3/uL (ref 0.2–0.9)
MONOS PCT: 9 %
NEUTROS ABS: 5.2 10*3/uL (ref 1.4–6.5)
Neutrophils Relative %: 65 %
Platelets: 381 10*3/uL (ref 150–440)
RBC: 4.59 MIL/uL (ref 3.80–5.20)
RDW: 21.4 % — AB (ref 11.5–14.5)
WBC: 7.9 10*3/uL (ref 3.6–11.0)

## 2016-07-29 LAB — COMPREHENSIVE METABOLIC PANEL
ALT: 59 U/L — ABNORMAL HIGH (ref 14–54)
AST: 78 U/L — AB (ref 15–41)
Albumin: 3.1 g/dL — ABNORMAL LOW (ref 3.5–5.0)
Alkaline Phosphatase: 59 U/L (ref 38–126)
Anion gap: 8 (ref 5–15)
BILIRUBIN TOTAL: 0.4 mg/dL (ref 0.3–1.2)
BUN: 21 mg/dL — ABNORMAL HIGH (ref 6–20)
CALCIUM: 10.1 mg/dL (ref 8.9–10.3)
CO2: 26 mmol/L (ref 22–32)
CREATININE: 2 mg/dL — AB (ref 0.44–1.00)
Chloride: 103 mmol/L (ref 101–111)
GFR calc Af Amer: 34 mL/min — ABNORMAL LOW (ref >60)
GFR, EST NON AFRICAN AMERICAN: 29 mL/min — AB (ref >60)
Glucose, Bld: 96 mg/dL (ref 65–99)
Potassium: 2.9 mmol/L — ABNORMAL LOW (ref 3.5–5.1)
Sodium: 137 mmol/L (ref 135–145)
Total Protein: 7.8 g/dL (ref 6.5–8.1)

## 2016-07-29 LAB — URINALYSIS, COMPLETE (UACMP) WITH MICROSCOPIC
BILIRUBIN URINE: NEGATIVE
Glucose, UA: NEGATIVE mg/dL
KETONES UR: NEGATIVE mg/dL
Leukocytes, UA: NEGATIVE
Nitrite: NEGATIVE
PH: 5 (ref 5.0–8.0)
Protein, ur: 100 mg/dL — AB
Specific Gravity, Urine: 1.014 (ref 1.005–1.030)

## 2016-07-29 LAB — LIPID PANEL
Cholesterol: 190 mg/dL (ref 0–200)
HDL: 52 mg/dL (ref >40)
LDL CALC: 109 mg/dL — AB (ref 0–99)
TRIGLYCERIDES: 143 mg/dL (ref <150)
Total CHOL/HDL Ratio: 3.7 RATIO
VLDL: 29 mg/dL (ref 0–40)

## 2016-07-29 LAB — TROPONIN I
TROPONIN I: 3.02 ng/mL — AB (ref <0.03)
Troponin I: 2.54 ng/mL (ref <0.03)

## 2016-07-29 LAB — HCG, QUANTITATIVE, PREGNANCY: hCG, Beta Chain, Quant, S: <1 m[IU]/mL (ref <5)

## 2016-07-29 LAB — APTT

## 2016-07-29 MED ORDER — ASPIRIN EC 325 MG PO TBEC
325.0000 mg | DELAYED_RELEASE_TABLET | Freq: Every day | ORAL | Status: DC
  Administered 2016-07-30 – 2016-07-31 (×2): 325 mg via ORAL
  Filled 2016-07-29 (×2): qty 1

## 2016-07-29 MED ORDER — ASPIRIN 81 MG PO CHEW
324.0000 mg | CHEWABLE_TABLET | Freq: Once | ORAL | Status: AC
  Administered 2016-07-29: 324 mg via ORAL
  Filled 2016-07-29: qty 4

## 2016-07-29 MED ORDER — DOCUSATE SODIUM 100 MG PO CAPS
100.0000 mg | ORAL_CAPSULE | Freq: Two times a day (BID) | ORAL | Status: DC
  Administered 2016-07-29 – 2016-08-02 (×7): 100 mg via ORAL
  Filled 2016-07-29 (×8): qty 1

## 2016-07-29 MED ORDER — SODIUM CHLORIDE 0.9% FLUSH
3.0000 mL | Freq: Two times a day (BID) | INTRAVENOUS | Status: DC
  Administered 2016-07-30 – 2016-08-01 (×4): 3 mL via INTRAVENOUS

## 2016-07-29 MED ORDER — HEPARIN (PORCINE) IN NACL 100-0.45 UNIT/ML-% IJ SOLN
1650.0000 [IU]/h | INTRAMUSCULAR | Status: DC
  Administered 2016-07-29: 1050 [IU]/h via INTRAVENOUS
  Administered 2016-07-30: 1200 [IU]/h via INTRAVENOUS
  Administered 2016-07-31 – 2016-08-01 (×2): 1650 [IU]/h via INTRAVENOUS
  Filled 2016-07-29 (×5): qty 250

## 2016-07-29 MED ORDER — SODIUM CHLORIDE 0.9 % IV SOLN
INTRAVENOUS | Status: DC
  Administered 2016-07-29 – 2016-08-01 (×4): via INTRAVENOUS

## 2016-07-29 MED ORDER — LOSARTAN POTASSIUM-HCTZ 100-25 MG PO TABS: 1.0000 | ORAL_TABLET | Freq: Every day | ORAL | Status: DC

## 2016-07-29 MED ORDER — POTASSIUM CHLORIDE CRYS ER 20 MEQ PO TBCR
40.0000 meq | EXTENDED_RELEASE_TABLET | Freq: Two times a day (BID) | ORAL | Status: DC
  Administered 2016-07-29 – 2016-08-02 (×7): 40 meq via ORAL
  Filled 2016-07-29 (×7): qty 2

## 2016-07-29 MED ORDER — ONDANSETRON HCL 4 MG PO TABS: 4.0000 mg | ORAL_TABLET | Freq: Four times a day (QID) | ORAL | Status: DC | PRN

## 2016-07-29 MED ORDER — METOPROLOL TARTRATE 25 MG PO TABS
25.0000 mg | ORAL_TABLET | Freq: Two times a day (BID) | ORAL | Status: DC
  Administered 2016-07-29 – 2016-07-30 (×2): 25 mg via ORAL
  Filled 2016-07-29 (×2): qty 1

## 2016-07-29 MED ORDER — DOCUSATE SODIUM 100 MG PO CAPS: 100.0000 mg | ORAL_CAPSULE | Freq: Two times a day (BID) | ORAL | Status: DC | PRN

## 2016-07-29 MED ORDER — LOSARTAN POTASSIUM 50 MG PO TABS
100.0000 mg | ORAL_TABLET | Freq: Every day | ORAL | Status: DC
  Administered 2016-07-30: 100 mg via ORAL
  Filled 2016-07-29: qty 2

## 2016-07-29 MED ORDER — NITROGLYCERIN 0.4 MG SL SUBL
0.4000 mg | SUBLINGUAL_TABLET | SUBLINGUAL | Status: DC | PRN
  Administered 2016-07-30: 0.4 mg via SUBLINGUAL
  Filled 2016-07-29: qty 1

## 2016-07-29 MED ORDER — HYDROCHLOROTHIAZIDE 25 MG PO TABS
25.0000 mg | ORAL_TABLET | Freq: Every day | ORAL | Status: DC
  Administered 2016-07-30: 25 mg via ORAL
  Filled 2016-07-29: qty 1

## 2016-07-29 MED ORDER — FERROUS SULFATE 325 (65 FE) MG PO TABS
325.0000 mg | ORAL_TABLET | Freq: Three times a day (TID) | ORAL | Status: DC
  Administered 2016-07-30 – 2016-08-02 (×8): 325 mg via ORAL
  Filled 2016-07-29 (×9): qty 1

## 2016-07-29 MED ORDER — HEPARIN BOLUS VIA INFUSION
4000.0000 [IU] | Freq: Once | INTRAVENOUS | Status: AC
  Administered 2016-07-29: 4000 [IU] via INTRAVENOUS
  Filled 2016-07-29: qty 4000

## 2016-07-29 NOTE — H&P (Signed)
[place] [facility] [place] NAME: [name]    MR#:  [id number]  DATE OF BIRTH:  1970-08-06  DATE OF ADMISSION:  07/29/2016  PRIMARY CARE PHYSICIAN: [place]   REQUESTING/REFERRING PHYSICIAN: Darel Hong  CHIEF COMPLAINT:   Chief Complaint  Patient presents with  . Numbness    HISTORY OF PRESENT ILLNESS: [name]  is a 46 y.o. female with a known history of Hypertension, anemia, takes birth control pills every day- morbidly obese, for last 1 week started having on and off episodes where she has numbness in her left upper extremity associated with palpitation in her chest and some shortness of breath. She just leave whatever work she is doing and has to sit down and those episodes last for a few minutes. Today she was at work and this same episode happened, concerned with this he finally came to emergency room. In ER her troponin was noted to 0.5 and so ER physician spoke to cardiologist he suggested to start on heparin IV drip and admitted to hospitalist service for further management. Concerned with her complain of numbness ER physician also did a CT scan on the head which showed incidental finding of meningioma but no other acute finding.  PAST MEDICAL HISTORY:   Past Medical History:  Diagnosis Date  . Anemia   . Hypertension     PAST SURGICAL HISTORY: No past surgical history on file.  SOCIAL HISTORY:  Social History  Substance Use Topics  . Smoking status: Never Smoker  . Smokeless tobacco: Never Used  . Alcohol use No    FAMILY HISTORY:  Family History  Problem Relation Age of Onset  . CAD Father     DRUG ALLERGIES:  Allergies  Allergen Reactions  . Ace Inhibitors Cough  . Latex Swelling    REVIEW OF SYSTEMS:   CONSTITUTIONAL: No fever, fatigue or weakness.  EYES: No blurred or double vision.  EARS, NOSE, AND THROAT: No tinnitus or ear pain.  RESPIRATORY: No cough, Positive for  shortness of breath, no wheezing or hemoptysis.  CARDIOVASCULAR: No chest pain, orthopnea, edema.  GASTROINTESTINAL: No nausea, vomiting, diarrhea or abdominal pain.  GENITOURINARY: No dysuria, hematuria.  ENDOCRINE: No polyuria, nocturia,  HEMATOLOGY: No anemia, easy bruising or bleeding SKIN: No rash or lesion. MUSCULOSKELETAL: No joint pain or arthritis.   NEUROLOGIC: No tingling, numbness, weakness.  PSYCHIATRY: No anxiety or depression.   MEDICATIONS AT HOME:  Prior to Admission medications   Medication Sig Start Date End Date Taking? Authorizing Provider  ferrous sulfate (FERROUSUL) 325 (65 FE) MG tablet Take 1 tablet (325 mg total) by mouth 2 (two) times daily with a meal. 09/14/15  Yes Gwen Her Schermerhorn, MD  losartan-hydrochlorothiazide (HYZAAR) 100-25 MG tablet Take 1 tablet by mouth daily.   Yes Historical Provider, MD  metoprolol tartrate (LOPRESSOR) 25 MG tablet Take 1 tablet (25 mg total) by mouth 2 (two) times daily. 09/14/15  Yes Gwen Her Schermerhorn, MD  norethindrone-ethinyl estradiol 1/35 ([place] 1/35, 28,) tablet Take 1 tablet by mouth daily. 1 po tid x 3 days then 1 po bid x 2 days then qd . Dispose of the last 7 tabs and restart new pack 09/14/15  Yes Gwen Her Schermerhorn, MD  ondansetron (ZOFRAN) 4 MG tablet Take 1 tablet (4 mg total) by mouth every 6 (six) hours as needed for nausea. 09/14/15  Yes Gwen Her Schermerhorn, MD  cloNIDine (CATAPRES) 0.2 MG tablet Take 1 tablet (0.2  mg total) by mouth 2 (two) times daily. Patient not taking: Reported on 07/29/2016 06/14/16 06/14/17  Daymon Larsen, MD  docusate sodium (COLACE) 100 MG capsule Take 1 capsule (100 mg total) by mouth 2 (two) times daily. Patient not taking: Reported on 07/29/2016 09/14/15   Gwen Her Schermerhorn, MD  ferrous sulfate 325 (65 FE) MG tablet Take 325 mg by mouth 3 (three) times daily with meals.    Historical Provider, MD  losartan-hydrochlorothiazide (HYZAAR) 100-25 MG tablet Take 1 tablet by  mouth daily. Patient not taking: Reported on 07/29/2016 09/14/15   Gwen Her Schermerhorn, MD  metoprolol tartrate (LOPRESSOR) 25 MG tablet Take 25 mg by mouth 2 (two) times daily.    Historical Provider, MD      PHYSICAL EXAMINATION:   VITAL SIGNS: Blood pressure (!) 157/81, pulse 80, temperature 98.4 F (36.9 C), temperature source Oral, resp. rate (!) 25, height 5\' 3"  (1.6 m), weight 122.5 kg (270 lb), SpO2 98 %.  GENERAL:  46 y.o.-year-old Obese patient lying in the bed with no acute distress.  EYES: Pupils equal, round, reactive to light and accommodation. No scleral icterus. Extraocular muscles intact.  HEENT: Head atraumatic, normocephalic. Oropharynx and nasopharynx clear.  NECK:  Supple, no jugular venous distention. No thyroid enlargement, no tenderness.  LUNGS: Normal breath sounds bilaterally, no wheezing, rales,rhonchi or crepitation. No use of accessory muscles of respiration.  CARDIOVASCULAR: S1, S2 normal. No murmurs, rubs, or gallops.  ABDOMEN: Soft, nontender, nondistended. Bowel sounds present. No organomegaly or mass.  EXTREMITIES: No pedal edema, cyanosis, or clubbing.  NEUROLOGIC: Cranial nerves II through XII are intact. Muscle strength 5/5 in all extremities. Sensation intact. Gait not checked.  PSYCHIATRIC: The patient is alert and oriented x 3.  SKIN: No obvious rash, lesion, or ulcer.   LABORATORY PANEL:   CBC  Recent Labs Lab 07/29/16 1723  WBC 7.9  HGB 9.5*  HCT 31.0*  PLT 381  MCV 67.6*  MCH 20.8*  MCHC 30.8*  RDW 21.4*  LYMPHSABS 1.8  MONOABS 0.7  EOSABS 0.2  BASOSABS 0.1   ------------------------------------------------------------------------------------------------------------------  Chemistries   Recent Labs Lab 07/29/16 1723  NA 137  K 2.9*  CL 103  CO2 26  GLUCOSE 96  BUN 21*  CREATININE 2.00*  CALCIUM 10.1  AST 78*  ALT 59*  ALKPHOS 59  BILITOT 0.4    ------------------------------------------------------------------------------------------------------------------ estimated creatinine clearance is 45.1 mL/min (A) (by C-G formula based on SCr of 2 mg/dL (H)). ------------------------------------------------------------------------------------------------------------------ No results for input(s): TSH, T4TOTAL, T3FREE, THYROIDAB in the last 72 hours.  Invalid input(s): FREET3   Coagulation profile  Recent Labs Lab 07/29/16 1819  INR 0.89   ------------------------------------------------------------------------------------------------------------------- No results for input(s): DDIMER in the last 72 hours. -------------------------------------------------------------------------------------------------------------------  Cardiac Enzymes  Recent Labs Lab 07/29/16 1723  TROPONINI 2.54*   ------------------------------------------------------------------------------------------------------------------ Invalid input(s): POCBNP  ---------------------------------------------------------------------------------------------------------------  Urinalysis    Component Value Date/Time   COLORURINE YELLOW (A) 07/29/2016 1735   APPEARANCEUR HAZY (A) 07/29/2016 1735   LABSPEC 1.014 07/29/2016 1735   PHURINE 5.0 07/29/2016 1735   GLUCOSEU NEGATIVE 07/29/2016 1735   HGBUR SMALL (A) 07/29/2016 1735   BILIRUBINUR NEGATIVE 07/29/2016 1735   KETONESUR NEGATIVE 07/29/2016 1735   PROTEINUR 100 (A) 07/29/2016 1735   NITRITE NEGATIVE 07/29/2016 1735   LEUKOCYTESUR NEGATIVE 07/29/2016 1735     RADIOLOGY: Dg Chest 2 View  Result Date: 07/29/2016 CLINICAL DATA:  Left-sided arm pain for 1 week. EXAM: CHEST  2 VIEW COMPARISON:  None. FINDINGS: There  is no focal parenchymal opacity. There is no pleural effusion or pneumothorax. The heart and mediastinal contours are unremarkable. The osseous structures are unremarkable. IMPRESSION: No  active cardiopulmonary disease. Electronically Signed   By: Kathreen Devoid   On: 07/29/2016 17:28   Ct Head Wo Contrast  Result Date: 07/29/2016 CLINICAL DATA:  LEFT-sided numbness for 1 week EXAM: CT HEAD WITHOUT CONTRAST TECHNIQUE: Contiguous axial images were obtained from the base of the skull through the vertex without intravenous contrast. COMPARISON:  None FINDINGS: Brain: Normal ventricular morphology. No midline shift or mass effect. Normal appearance of brain parenchyma. No intracranial hemorrhage, evidence of acute infarction, or extra-axial fluid collection. 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 Calcified extra-axial mass at the posterior parietal region on RIGHT 11 x 12 x 11 mm most consistent with meningioma. Vascular: Unremarkable Skull: Intact Sinuses/Orbits: Clear Other: N/A IMPRESSION: No acute intracranial abnormalities. 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 Electronically Signed   By: Lavonia Dana M.D.   On: 07/29/2016 17:32    EKG: Orders placed or performed during the hospital encounter of 07/29/16  . ED EKG  . ED EKG  . EKG 12-Lead  . EKG 12-Lead  . ED EKG  . ED EKG  . EKG 12-Lead  . EKG 12-Lead    IMPRESSION AND PLAN:  * Non-ST elevation MI   IV heparin drip.   Give aspirin, continue metoprolol.   Check lipid panel and hemoglobin A1c.   Monitor on telemetry and follow serial troponin.   Get echocardiogram.   ER physician already informed cardiologist, for further management as per him.  * Hypertension   Continue metoprolol, monitor.  * Anemia- iron deficiency   Continue oral iron supplements.  * Acute on chronic renal failure CKD stage III   She has worsening in her creatinine currently, with IV fluid continue monitoring.  All the records are reviewed and case discussed with ED provider. Management plans discussed with the patient,  family and they are in agreement.  CODE STATUS: Full code Code Status History    Date Active Date Inactive Code Status Order ID Comments User Context   09/13/2015  9:31 PM 09/14/2015  8:07 PM Full Code [id number]  Boykin Nearing, MD ED       TOTAL TIME TAKING CARE OF THIS PATIENT: 50 minutes.    Vaughan Basta M.D on 07/29/2016   Between 7am to 6pm - Pager - [phone]  After 6pm go to [email or web address] - password [email or web address] [place] [facility]  Office  [phone]  CC: Primary care physician; [facility]   Note: This dictation was prepared with Dragon dictation along with smaller phrase technology. Any transcriptional errors that result from this process are unintentional.

## 2016-07-29 NOTE — ED Notes (Signed)
Dr. Rifenbark at bedside 

## 2016-07-29 NOTE — ED Notes (Signed)
Pt ambulatory to toilet to collect urine sample.  

## 2016-07-29 NOTE — ED Notes (Signed)
Attempted 2nd IV site, unsuccessful. Asked Levada Dy RN to take a look.

## 2016-07-29 NOTE — Progress Notes (Signed)
ANTICOAGULATION CONSULT NOTE - Initial Consult  Pharmacy Consult for heparin drip Indication: chest pain/ACS  Allergies  Allergen Reactions  . Ace Inhibitors Cough  . Latex Swelling    Patient Measurements: Height: 5\' 3"  (160 cm) Weight: 270 lb (122.5 kg) IBW/kg (Calculated) : 52.4 Heparin Dosing Weight: 83 kg  Vital Signs: Temp: 98.4 F (36.9 C) (03/31 1632) Temp Source: Oral (03/31 1632) BP: 165/88 (03/31 1738) Pulse Rate: 96 (03/31 1738)  Labs:  Recent Labs  07/29/16 1723  HGB 9.5*  HCT 31.0*  PLT 381  CREATININE 2.00*  TROPONINI 2.54*    Estimated Creatinine Clearance: 45.1 mL/min (A) (by C-G formula based on SCr of 2 mg/dL (H)).   Medical History: Past Medical History:  Diagnosis Date  . Anemia   . Hypertension     Assessment: 46 yo female with  chest pain/ACS. Pharmacy consulted for heparin dosing and monitoring.   Goal of Therapy:  Heparin level 0.3-0.7 units/ml Monitor platelets by anticoagulation protocol: Yes   Plan:  Baseline labs ordered. Dosing weight: 83kg Give 4000 units bolus x 1 Start heparin infusion at 1000 units/hr Check anti-Xa level in 6 hours and daily while on heparin Continue to monitor H&H and platelets  Pernell Dupre, PharmD, BCPS Clinical Pharmacist 07/29/2016 6:27 PM

## 2016-07-29 NOTE — ED Notes (Addendum)
Pt ambulatory to treatment room 5 c/o of L arm numbness only when standing intermittent x 1 week. States she works in [employer] and has to stand a lot. Denies slurred speech, denies amnesia. Denies N&V&D or fever. States feels like arm is asleep. Pt alert, oriented, answers all questions appropriately.   Pt also c/o that heart will race when numbness starts. Pt states that when she was checking in L arm went numb and she felt like her heart was racing and she had to lean over because of symptoms.

## 2016-07-29 NOTE — ED Provider Notes (Addendum)
[facility] Emergency Department Provider Note  ____________________________________________   First MD Initiated Contact with Patient 07/29/16 1654     (approximate)  I have reviewed the triage vital signs and the nursing notes.   HISTORY  Chief Complaint Numbness    HPI [name] is a 46 y.o. female who comes to the emergency department with multiple complaints. She says that for the past week or so she has not felt like herself she feels like she has palpitations when she stands up. 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 Yesterday she was going to come to the emergency department because she had a one-hour episode of nonexertional chest discomfort, however she came home instead. She has not had any chest pain before or since. She was at work today and she noted that her left arm felt heavy and numb and she had numbness throughout the entire left side of her body so her loss told her to come to the emergency department. She has no complaints at this time.Last time she had chest pain was 24 hours ago.   Past Medical History:  Diagnosis Date  . Anemia   . Hypertension     Patient Active Problem List   Diagnosis Date Noted  . Severe anemia 09/13/2015  . IRREGULAR MENSES 05/28/2007  . COUGH 04/12/2007  . OBESITY, MORBID 11/24/2006  . ANEMIA-IRON DEFICIENCY 11/24/2006  . HYPERTENSION 11/24/2006    No past surgical history on file.  Prior to Admission medications   Medication Sig Start Date End Date Taking? Authorizing Provider  ferrous sulfate (FERROUSUL) 325 (65 FE) MG tablet Take 1 tablet (325 mg total) by mouth 2 (two) times daily with a meal. 09/14/15  Yes Gwen Her Schermerhorn, MD  losartan-hydrochlorothiazide (HYZAAR) 100-25 MG tablet Take 1 tablet by mouth daily.   Yes Historical  Provider, MD  metoprolol tartrate (LOPRESSOR) 25 MG tablet Take 1 tablet (25 mg total) by mouth 2 (two) times daily. 09/14/15  Yes Gwen Her Schermerhorn, MD  norethindrone-ethinyl estradiol 1/35 ([place] 1/35, 28,) tablet Take 1 tablet by mouth daily. 1 po tid x 3 days then 1 po bid x 2 days then qd . Dispose of the last 7 tabs and restart new pack 09/14/15  Yes Gwen Her Schermerhorn, MD  ondansetron (ZOFRAN) 4 MG tablet Take 1 tablet (4 mg total) by mouth every 6 (six) hours as needed for nausea. 09/14/15  Yes Gwen Her Schermerhorn, MD  cloNIDine (CATAPRES) 0.2 MG tablet Take 1 tablet (0.2 mg total) by mouth 2 (two) times daily. Patient not taking: Reported on 07/29/2016 06/14/16 06/14/17  Daymon Larsen, MD  docusate sodium (COLACE) 100 MG capsule Take 1 capsule (100 mg total) by mouth 2 (two) times daily. Patient not taking: Reported on 07/29/2016 09/14/15   Gwen Her Schermerhorn, MD  ferrous sulfate 325 (65 FE) MG tablet Take 325 mg by mouth 3 (three) times daily with meals.    Historical Provider, MD  losartan-hydrochlorothiazide (HYZAAR) 100-25 MG tablet Take 1 tablet by mouth daily. Patient not taking: Reported on 07/29/2016 09/14/15   Gwen Her Schermerhorn, MD  metoprolol tartrate (LOPRESSOR) 25 MG tablet Take 25 mg by mouth 2 (two) times daily.    Historical Provider, MD    Allergies Ace inhibitors and Latex  No family history on file.  Social History  Social History  Substance Use Topics  . Smoking status: Never Smoker  . Smokeless tobacco: Never Used  . Alcohol use No    Review of Systems Constitutional: No fever/chills Eyes: No visual changes. ENT: No sore throat. Cardiovascular: Positive chest pain. Respiratory: Denies shortness of breath. Gastrointestinal: No abdominal pain.  No nausea, no vomiting.  No diarrhea.  No constipation. Genitourinary: Negative for dysuria. Musculoskeletal: Negative for back pain. Skin: Negative for rash. Neurological: Negative for headaches,  positive for numbness  10-point ROS otherwise negative.  ____________________________________________   PHYSICAL EXAM:  VITAL SIGNS: ED Triage Vitals  Enc Vitals Group     BP 07/29/16 1632 (!) 198/95     Pulse Rate 07/29/16 1632 (!) 103     Resp 07/29/16 1632 18     Temp 07/29/16 1632 98.4 F (36.9 C)     Temp Source 07/29/16 1632 Oral     SpO2 07/29/16 1632 97 %     Weight 07/29/16 1633 270 lb (122.5 kg)     Height 07/29/16 1633 5\' 3"  (1.6 m)     Head Circumference --      Peak Flow --      Pain Score --      Pain Loc --      Pain Edu? --      Excl. in [place]? --     Constitutional: Alert and oriented x 4 well appearing nontoxic no diaphoresis speaks in full, clear sentences Eyes: PERRL EOMI. Head: Atraumatic. Nose: No congestion/rhinnorhea. Mouth/Throat: No trismus Neck: No stridor.   Cardiovascular: Normal rate, regular rhythm. Grossly normal heart sounds.  Good peripheral circulation.Able to lie completely flat with no jugular venous distention Respiratory: Normal respiratory effort.  No retractions. Lungs CTAB and moving good air Gastrointestinal: Soft nondistended nontender Obese Musculoskeletal: No lower extremity edema   Neurologic:   Renal nerves II through XII intact No pronator drift 5 out of 5 grips biceps triceps plantar flexion dorsiflexion Decreased sensation left upper extremity Skin:  Skin is warm, dry and intact. No rash noted. Psychiatric: Mood and affect are normal. Speech and behavior are normal.    ____________________________________________   DIFFERENTIAL  Acute coronary syndrome, stroke, metabolic derangement, hyperglycemia ____________________________________________   LABS (all labs ordered are listed, but only abnormal results are displayed)  Labs Reviewed  CBC WITH DIFFERENTIAL/PLATELET - Abnormal; Notable for the following:       Result Value   Hemoglobin 9.5 (*)    HCT 31.0 (*)    MCV 67.6 (*)    MCH 20.8 (*)    MCHC 30.8  (*)    RDW 21.4 (*)    All other components within normal limits  COMPREHENSIVE METABOLIC PANEL - Abnormal; Notable for the following:    Potassium 2.9 (*)    BUN 21 (*)    Creatinine, Ser 2.00 (*)    Albumin 3.1 (*)    AST 78 (*)    ALT 59 (*)    GFR calc non Af Amer 29 (*)    GFR calc Af Amer 34 (*)    All other components within normal limits  TROPONIN I - Abnormal; Notable for the following:    Troponin I 2.54 (*)    All other components within normal limits  URINALYSIS, COMPLETE (UACMP) WITH MICROSCOPIC - Abnormal; Notable for the following:    Color, Urine YELLOW (*)    APPearance HAZY (*)    Hgb urine dipstick SMALL (*)    Protein, ur 100 (*)  Bacteria, UA RARE (*)    Squamous Epithelial / LPF 0-5 (*)    All other components within normal limits  HCG, QUANTITATIVE, PREGNANCY  APTT  PROTIME-INR    Elevated troponin consistent with myocardial injury __________________________________________  EKG  ED ECG REPORT I, Darel Hong, the attending physician, personally viewed and interpreted this ECG.  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  Critical care was necessary to treat or prevent imminent or life-threatening deterioration.  Critical care was time spent personally  by me on the following activities: development of treatment plan with patient and/or surrogate as well as nursing, discussions with consultants, evaluation of patient's response to treatment, examination of patient, obtaining history from patient or surrogate, ordering and performing treatments and interventions, ordering and review of laboratory studies, ordering and review of radiographic studies, pulse oximetry and re-evaluation of patient's condition.   ____________________________________________   INITIAL IMPRESSION / ASSESSMENT AND PLAN / ED COURSE  Pertinent labs & imaging results that were available during my care of the patient were reviewed by me and considered in my medical decision making (see chart for details).  The patient's symptoms are difficult to quantify and differential is quite broad including acute coronary syndrome and cerebrovascular accident versus transient ischemic attack. Fortunately her exam is normal. Labs and a CT scan as well as EKG are pending.     ----------------------------------------- 5:52 PM on 07/29/2016 -----------------------------------------  The patient's head CT just came back with a right parietal likely meningioma which could explain her left-sided symptoms. We don't have neurosurgery coverage today so I will touch base with [facility] neurosurgery for a phone consult.  ----------------------------------------- 6:17 PM on 07/29/2016 -----------------------------------------  Troponin on the patient just came back at 2.54. We'll she does have renal insufficiency this likely represents an NSTEMI. Full aspirin now and cards consult pending. ____________________________________________  ----------------------------------------- 6:25 PM on 07/29/2016 -----------------------------------------  I spoke with Dr. Chancy Milroy on call for unassigned Cardiology who will kindly consult on the patient.  He recommends heparin drip and aspirin.  1847:  [facility]  Neurosurgeon recommended the patient call [phone] to make an appointment to establish care.   FINAL CLINICAL IMPRESSION(S) / ED DIAGNOSES  Final diagnoses:  NSTEMI (non-ST elevated myocardial infarction) ([place])      NEW MEDICATIONS STARTED DURING THIS VISIT:  New Prescriptions   No medications on file     Note:  This document was prepared using Dragon voice recognition software and may include unintentional dictation errors.     Darel Hong, MD 07/29/16 [place], MD 07/29/16 [phone]

## 2016-07-29 NOTE — ED Notes (Signed)
Pt taken to scans via stretcher.  

## 2016-07-29 NOTE — ED Notes (Signed)
Pt given water, ice chips cup, and [place] sandwich with applesauce.

## 2016-07-29 NOTE — ED Triage Notes (Signed)
Pt reports left arm numbness and heaviness for one week. Pt denies pain. Bilateral strong hand grips. Pt denies NVD, recent fevers or other signs of illness.

## 2016-07-29 NOTE — ED Notes (Signed)
Pt talking on phone, tearful.

## 2016-07-29 NOTE — Progress Notes (Signed)
Pt. Arrived to floor via stretcher, transferred to bed independently, Tele applied, verified with Estill Bamberg, CNA. Skin check with Crystal, RN, no issues noted, skin clean, warm and dry. General room orientation given. Instruction on how to use ascom and call bell system given. Pt. A&O, assisted to bathroom. On heparin gtt.

## 2016-07-30 LAB — CBC
HCT: 29.1 % — ABNORMAL LOW (ref 35.0–47.0)
Hemoglobin: 8.8 g/dL — ABNORMAL LOW (ref 12.0–16.0)
MCH: 20.6 pg — AB (ref 26.0–34.0)
MCHC: 30.3 g/dL — AB (ref 32.0–36.0)
MCV: 68 fL — ABNORMAL LOW (ref 80.0–100.0)
PLATELETS: 342 10*3/uL (ref 150–440)
RBC: 4.28 MIL/uL (ref 3.80–5.20)
RDW: 21.8 % — AB (ref 11.5–14.5)
WBC: 7.2 10*3/uL (ref 3.6–11.0)

## 2016-07-30 LAB — BASIC METABOLIC PANEL
Anion gap: 7 (ref 5–15)
BUN: 21 mg/dL — ABNORMAL HIGH (ref 6–20)
CALCIUM: 9.8 mg/dL (ref 8.9–10.3)
CO2: 27 mmol/L (ref 22–32)
CREATININE: 2.13 mg/dL — AB (ref 0.44–1.00)
Chloride: 105 mmol/L (ref 101–111)
GFR calc Af Amer: 31 mL/min — ABNORMAL LOW (ref >60)
GFR calc non Af Amer: 27 mL/min — ABNORMAL LOW (ref >60)
GLUCOSE: 96 mg/dL (ref 65–99)
Potassium: 3.3 mmol/L — ABNORMAL LOW (ref 3.5–5.1)
Sodium: 139 mmol/L (ref 135–145)

## 2016-07-30 LAB — HEPARIN LEVEL (UNFRACTIONATED)
HEPARIN UNFRACTIONATED: 0.1 [IU]/mL — AB (ref 0.30–0.70)
HEPARIN UNFRACTIONATED: 0.31 [IU]/mL (ref 0.30–0.70)
Heparin Unfractionated: 0.14 IU/mL — ABNORMAL LOW (ref 0.30–0.70)
Heparin Unfractionated: 0.26 IU/mL — ABNORMAL LOW (ref 0.30–0.70)

## 2016-07-30 LAB — MAGNESIUM: Magnesium: 1.7 mg/dL (ref 1.7–2.4)

## 2016-07-30 LAB — TROPONIN I
Troponin I: 3.87 ng/mL (ref <0.03)
Troponin I: 3.8 ng/mL (ref <0.03)

## 2016-07-30 MED ORDER — HEPARIN BOLUS VIA INFUSION
2500.0000 [IU] | Freq: Once | INTRAVENOUS | Status: AC
  Administered 2016-07-30: 2500 [IU] via INTRAVENOUS
  Filled 2016-07-30: qty 2500

## 2016-07-30 MED ORDER — RANOLAZINE ER 500 MG PO TB12
500.0000 mg | ORAL_TABLET | Freq: Two times a day (BID) | ORAL | Status: DC
  Administered 2016-07-30 – 2016-07-31 (×4): 500 mg via ORAL
  Filled 2016-07-30 (×5): qty 1

## 2016-07-30 MED ORDER — METOPROLOL TARTRATE 50 MG PO TABS
100.0000 mg | ORAL_TABLET | Freq: Two times a day (BID) | ORAL | Status: DC
  Administered 2016-07-30 – 2016-08-01 (×4): 100 mg via ORAL
  Filled 2016-07-30 (×4): qty 2

## 2016-07-30 MED ORDER — ISOSORBIDE MONONITRATE ER 60 MG PO TB24
60.0000 mg | ORAL_TABLET | Freq: Every day | ORAL | Status: DC
  Administered 2016-07-30 – 2016-08-01 (×3): 60 mg via ORAL
  Filled 2016-07-30 (×3): qty 1

## 2016-07-30 MED ORDER — HEPARIN BOLUS VIA INFUSION
1250.0000 [IU] | Freq: Once | INTRAVENOUS | Status: AC
  Administered 2016-07-30: 1250 [IU] via INTRAVENOUS
  Filled 2016-07-30: qty 1250

## 2016-07-30 MED ORDER — ATORVASTATIN CALCIUM 20 MG PO TABS
40.0000 mg | ORAL_TABLET | Freq: Every day | ORAL | Status: DC
  Administered 2016-07-30 – 2016-08-01 (×3): 40 mg via ORAL
  Filled 2016-07-30 (×3): qty 2

## 2016-07-30 MED ORDER — HYDRALAZINE HCL 50 MG PO TABS
50.0000 mg | ORAL_TABLET | Freq: Three times a day (TID) | ORAL | Status: DC
  Administered 2016-07-30 – 2016-07-31 (×4): 50 mg via ORAL
  Filled 2016-07-30 (×4): qty 1

## 2016-07-30 MED ORDER — METOPROLOL TARTRATE 50 MG PO TABS: 50.0000 mg | ORAL_TABLET | Freq: Two times a day (BID) | ORAL | Status: DC

## 2016-07-30 MED ORDER — LABETALOL HCL 5 MG/ML IV SOLN
10.0000 mg | INTRAVENOUS | Status: DC | PRN
Start: 2016-07-30 — End: 2016-08-02
  Administered 2016-07-30 – 2016-08-02 (×3): 10 mg via INTRAVENOUS
  Filled 2016-07-30 (×4): qty 4

## 2016-07-30 MED ORDER — CALCIUM CARBONATE ANTACID 500 MG PO CHEW
1.0000 | CHEWABLE_TABLET | Freq: Three times a day (TID) | ORAL | Status: DC
  Administered 2016-07-30 – 2016-08-02 (×7): 200 mg via ORAL
  Filled 2016-07-30 (×7): qty 1

## 2016-07-30 NOTE — Plan of Care (Signed)
Problem: Pain Managment: Goal: General experience of comfort will improve Outcome: Progressing No complaints of pain.

## 2016-07-30 NOTE — Progress Notes (Signed)
[name] is a 46 y.o. female  [id number]  Primary Cardiologist: Neoma Laming Reason for Consultation: Numbness in the arms with elevated troponin  HPI: This is a 46 year old African-American female with a past medical history of renal insufficiency obesity hypertension who works at [employer] and while she was working she felt numbness in her arms and fingers day before yesterday as well as some chest pain. She wasn't having any chest pain when she came to the emergency room and this morning also has no chest pain. She says she is feeling much better.   Review of Systems: No orthopnea PND or leg swelling or chest pain at this time.   Past Medical History:  Diagnosis Date  . Anemia   . Hypertension     Medications Prior to Admission  Medication Sig Dispense Refill  . ferrous sulfate (FERROUSUL) 325 (65 FE) MG tablet Take 1 tablet (325 mg total) by mouth 2 (two) times daily with a meal. 60 tablet 3  . losartan-hydrochlorothiazide (HYZAAR) 100-25 MG tablet Take 1 tablet by mouth daily.    . metoprolol tartrate (LOPRESSOR) 25 MG tablet Take 1 tablet (25 mg total) by mouth 2 (two) times daily. 60 tablet 6  . norethindrone-ethinyl estradiol 1/35 ([place] 1/35, 28,) tablet Take 1 tablet by mouth daily. 1 po tid x 3 days then 1 po bid x 2 days then qd . Dispose of the last 7 tabs and restart new pack 2 Package 11  . ondansetron (ZOFRAN) 4 MG tablet Take 1 tablet (4 mg total) by mouth every 6 (six) hours as needed for nausea. 30 tablet 0  . cloNIDine (CATAPRES) 0.2 MG tablet Take 1 tablet (0.2 mg total) by mouth 2 (two) times daily. (Patient not taking: Reported on 07/29/2016) 30 tablet 11  . docusate sodium (COLACE) 100 MG capsule Take 1 capsule (100 mg total) by mouth 2 (two) times daily. (Patient not taking: Reported on 07/29/2016) 60 capsule 0  . ferrous sulfate 325 (65 FE) MG tablet Take 325 mg by mouth 3 (three) times daily with meals.    Marland Kitchen losartan-hydrochlorothiazide (HYZAAR)  100-25 MG tablet Take 1 tablet by mouth daily. (Patient not taking: Reported on 07/29/2016) 30 tablet 6  . metoprolol tartrate (LOPRESSOR) 25 MG tablet Take 25 mg by mouth 2 (two) times daily.       Marland Kitchen aspirin EC  325 mg Oral Daily  . docusate sodium  100 mg Oral BID  . ferrous sulfate  325 mg Oral TID WC  . losartan  100 mg Oral Daily   And  . hydrochlorothiazide  25 mg Oral Daily  . metoprolol tartrate  50 mg Oral BID  . potassium chloride  40 mEq Oral BID  . sodium chloride flush  3 mL Intravenous Q12H    Infusions: . sodium chloride 75 mL/hr at 07/29/16 2208  . heparin 1,200 Units/hr (07/30/16 0226)    Allergies  Allergen Reactions  . Ace Inhibitors Cough  . Latex Swelling    Social History   Social History  . Marital status: Single    Spouse name: N/A  . Number of children: N/A  . Years of education: N/A   Occupational History  . Not on file.   Social History Main Topics  . Smoking status: Never Smoker  . Smokeless tobacco: Never Used  . Alcohol use No  . Drug use: Unknown  . Sexual activity: Not on file   Other Topics Concern  . Not on file  Social History Narrative  . No narrative on file    Family History  Problem Relation Age of Onset  . CAD Father     PHYSICAL EXAM: Vitals:   07/30/16 0655 07/30/16 0806  BP: (!) 173/89 (!) 159/102  Pulse:  79  Resp:  16  Temp:  97.7 F (36.5 C)     Intake/Output Summary (Last 24 hours) at 07/30/16 1102 Last data filed at 07/30/16 0516  Gross per 24 hour  Intake           446.53 ml  Output              850 ml  Net          -403.47 ml    General:  Well appearing. No respiratory difficulty HEENT: normal Neck: supple. no JVD. Carotids 2+ bilat; no bruits. No lymphadenopathy or thryomegaly appreciated. Cor: PMI nondisplaced. Regular rate & rhythm. No rubs, gallops or murmurs. Lungs: clear Abdomen: soft, nontender, nondistended. No hepatosplenomegaly. No bruits or masses. Good bowel  sounds. Extremities: no cyanosis, clubbing, rash, edema Neuro: alert & oriented x 3, cranial nerves grossly intact. moves all 4 extremities w/o difficulty. Affect pleasant.  ALP:FXTKWI sinus rhythm and LVH with nonspecific ST-T changes  Results for orders placed or performed during the hospital encounter of 07/29/16 (from the past 24 hour(s))  CBC with Differential     Status: Abnormal   Collection Time: 07/29/16  5:23 PM  Result Value Ref Range   WBC 7.9 3.6 - 11.0 K/uL   RBC 4.59 3.80 - 5.20 MIL/uL   Hemoglobin 9.5 (L) 12.0 - 16.0 g/dL   HCT 31.0 (L) 35.0 - 47.0 %   MCV 67.6 (L) 80.0 - 100.0 fL   MCH 20.8 (L) 26.0 - 34.0 pg   MCHC 30.8 (L) 32.0 - 36.0 g/dL   RDW 21.4 (H) 11.5 - 14.5 %   Platelets 381 150 - 440 K/uL   Neutrophils Relative % 65 %   Neutro Abs 5.2 1.4 - 6.5 K/uL   Lymphocytes Relative 22 %   Lymphs Abs 1.8 1.0 - 3.6 K/uL   Monocytes Relative 9 %   Monocytes Absolute 0.7 0.2 - 0.9 K/uL   Eosinophils Relative 3 %   Eosinophils Absolute 0.2 0 - 0.7 K/uL   Basophils Relative 1 %   Basophils Absolute 0.1 0 - 0.1 K/uL  Comprehensive metabolic panel     Status: Abnormal   Collection Time: 07/29/16  5:23 PM  Result Value Ref Range   Sodium 137 135 - 145 mmol/L   Potassium 2.9 (L) 3.5 - 5.1 mmol/L   Chloride 103 101 - 111 mmol/L   CO2 26 22 - 32 mmol/L   Glucose, Bld 96 65 - 99 mg/dL   BUN 21 (H) 6 - 20 mg/dL   Creatinine, Ser 2.00 (H) 0.44 - 1.00 mg/dL   Calcium 10.1 8.9 - 10.3 mg/dL   Total Protein 7.8 6.5 - 8.1 g/dL   Albumin 3.1 (L) 3.5 - 5.0 g/dL   AST 78 (H) 15 - 41 U/L   ALT 59 (H) 14 - 54 U/L   Alkaline Phosphatase 59 38 - 126 U/L   Total Bilirubin 0.4 0.3 - 1.2 mg/dL   GFR calc non Af Amer 29 (L) >60 mL/min   GFR calc Af Amer 34 (L) >60 mL/min   Anion gap 8 5 - 15  Troponin I     Status: Abnormal   Collection Time: 07/29/16  5:23 PM  Result Value Ref Range   Troponin I 2.54 (HH) <0.03 ng/mL  hCG, quantitative, pregnancy     Status: None    Collection Time: 07/29/16  5:23 PM  Result Value Ref Range   hCG, Beta Chain, Quant, S <1 <5 mIU/mL  Urinalysis, Complete w Microscopic     Status: Abnormal   Collection Time: 07/29/16  5:35 PM  Result Value Ref Range   Color, Urine YELLOW (A) YELLOW   APPearance HAZY (A) CLEAR   Specific Gravity, Urine 1.014 1.005 - 1.030   pH 5.0 5.0 - 8.0   Glucose, UA NEGATIVE NEGATIVE mg/dL   Hgb urine dipstick SMALL (A) NEGATIVE   Bilirubin Urine NEGATIVE NEGATIVE   Ketones, ur NEGATIVE NEGATIVE mg/dL   Protein, ur 100 (A) NEGATIVE mg/dL   Nitrite NEGATIVE NEGATIVE   Leukocytes, UA NEGATIVE NEGATIVE   RBC / HPF 0-5 0 - 5 RBC/hpf   WBC, UA 0-5 0 - 5 WBC/hpf   Bacteria, UA RARE (A) NONE SEEN   Squamous Epithelial / LPF 0-5 (A) NONE SEEN   Mucous PRESENT    Hyaline Casts, UA PRESENT   Lipid panel     Status: Abnormal   Collection Time: 07/29/16  5:53 PM  Result Value Ref Range   Cholesterol 190 0 - 200 mg/dL   Triglycerides 143 <150 mg/dL   HDL 52 >40 mg/dL   Total CHOL/HDL Ratio 3.7 RATIO   VLDL 29 0 - 40 mg/dL   LDL Cholesterol 109 (H) 0 - 99 mg/dL  APTT     Status: Abnormal   Collection Time: 07/29/16  6:19 PM  Result Value Ref Range   aPTT <24 (L) 24 - 36 seconds  Protime-INR     Status: None   Collection Time: 07/29/16  6:19 PM  Result Value Ref Range   Prothrombin Time 12.0 11.4 - 15.2 seconds   INR 0.89   Troponin I     Status: Abnormal   Collection Time: 07/29/16  7:17 PM  Result Value Ref Range   Troponin I 3.02 (HH) <0.03 ng/mL  Troponin I     Status: Abnormal   Collection Time: 07/29/16 10:54 PM  Result Value Ref Range   Troponin I 3.87 (HH) <0.03 ng/mL  Heparin level (unfractionated)     Status: Abnormal   Collection Time: 07/30/16  1:35 AM  Result Value Ref Range   Heparin Unfractionated 0.10 (L) 0.30 - 0.70 IU/mL  Basic metabolic panel     Status: Abnormal   Collection Time: 07/30/16  1:35 AM  Result Value Ref Range   Sodium 139 135 - 145 mmol/L   Potassium  3.3 (L) 3.5 - 5.1 mmol/L   Chloride 105 101 - 111 mmol/L   CO2 27 22 - 32 mmol/L   Glucose, Bld 96 65 - 99 mg/dL   BUN 21 (H) 6 - 20 mg/dL   Creatinine, Ser 2.13 (H) 0.44 - 1.00 mg/dL   Calcium 9.8 8.9 - 10.3 mg/dL   GFR calc non Af Amer 27 (L) >60 mL/min   GFR calc Af Amer 31 (L) >60 mL/min   Anion gap 7 5 - 15  CBC     Status: Abnormal   Collection Time: 07/30/16  1:35 AM  Result Value Ref Range   WBC 7.2 3.6 - 11.0 K/uL   RBC 4.28 3.80 - 5.20 MIL/uL   Hemoglobin 8.8 (L) 12.0 - 16.0 g/dL   HCT 29.1 (L) 35.0 - 47.0 %   MCV 68.0 (  L) 80.0 - 100.0 fL   MCH 20.6 (L) 26.0 - 34.0 pg   MCHC 30.3 (L) 32.0 - 36.0 g/dL   RDW 21.8 (H) 11.5 - 14.5 %   Platelets 342 150 - 440 K/uL  Heparin level (unfractionated)     Status: None   Collection Time: 07/30/16  7:25 AM  Result Value Ref Range   Heparin Unfractionated 0.31 0.30 - 0.70 IU/mL   Dg Chest 2 View  Result Date: 07/29/2016 CLINICAL DATA:  Left-sided arm pain for 1 week. EXAM: CHEST  2 VIEW COMPARISON:  None. FINDINGS: There is no focal parenchymal opacity. There is no pleural effusion or pneumothorax. The heart and mediastinal contours are unremarkable. The osseous structures are unremarkable. IMPRESSION: No active cardiopulmonary disease. Electronically Signed   By: Kathreen Devoid   On: 07/29/2016 17:28   Ct Head Wo Contrast  Result Date: 07/29/2016 CLINICAL DATA:  LEFT-sided numbness for 1 week EXAM: CT HEAD WITHOUT CONTRAST TECHNIQUE: Contiguous axial images were obtained from the base of the skull through the vertex without intravenous contrast. COMPARISON:  None FINDINGS: Brain: Normal ventricular morphology. No midline shift or mass effect. Normal appearance of brain parenchyma. No intracranial hemorrhage, evidence of acute infarction, or extra-axial fluid collection. 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 Calcified extra-axial mass at the posterior parietal region on RIGHT 11 x 12 x 11 mm most consistent with  meningioma. Vascular: Unremarkable Skull: Intact Sinuses/Orbits: Clear Other: N/A IMPRESSION: No acute intracranial abnormalities. 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 Electronically Signed   By: Lavonia Dana M.D.   On: 07/29/2016 17:32     ASSESSMENT AND PLAN: Non-STEMI with atypical present patient of numbness in the arms 2 days prior to admission with no further chest pain or shortness of breath or numbness at this time. Patient has severe anemia with hemoglobin 8 and renal failure with creatinine of 2.11. I explained the risk and benefits of cardiac catheterization to her and she has declined to have cardiac catheterization. I explained to her that she would be high risk with severe anemia and possible GI bleed and renal insufficiency which would get worse by giving dye during catheterization and possible angioplasty. She would also need antiplatelet including Plavix and aspirin which she will have difficulty tolerating. She refused to go through cardiac catheterization. Advise getting GI consults for severe anemia and consider transfusing up until hemoglobin of 10. Also advise getting renal consult as she has significant renal insufficiency. If her renal function and hemoglobin improves during this admission may consider further evaluation for cardiac catheterization but not at this time. I also advise stopping losartan and changing to hydralazine and beta blockers and adding isosorbide. We will locate echocardiogram to see whether patient has wall motion abnormality and what the ejection fraction is.  KHAN,SHAUKAT A

## 2016-07-30 NOTE — Progress Notes (Signed)
ANTICOAGULATION CONSULT NOTE - Initial Consult  Pharmacy Consult for heparin drip Indication: chest pain/ACS      Allergies  Allergen Reactions  . Ace Inhibitors Cough  . Latex Swelling    Patient Measurements: Height: 5\' 3"  (160 cm) Weight: 270 lb (122.5 kg) IBW/kg (Calculated) : 52.4 Heparin Dosing Weight: 83 kg  Vital Signs: Temp: 98.4 F (36.9 C) (03/31 1632) Temp Source: Oral (03/31 1632) BP: 165/88 (03/31 1738) Pulse Rate: 96 (03/31 1738)  Labs:  HL= 0.14  CBC Latest Ref Rng & Units 07/30/2016 07/29/2016 06/14/2016  WBC 3.6 - 11.0 K/uL 7.2 7.9 8.6  Hemoglobin 12.0 - 16.0 g/dL 8.8(L) 9.5(L) 9.6(L)  Hematocrit 35.0 - 47.0 % 29.1(L) 31.0(L) 32.3(L)  Platelets 150 - 440 K/uL [phone]   Medical History:     Past Medical History:  Diagnosis Date  . Anemia   . Hypertension    Assessment: 46 yo female with  chest pain/ACS. Pharmacy consulted for heparin dosing and monitoring.   Goal of Therapy:  Heparin level 0.3-0.7 units/ml Monitor platelets by anticoagulation protocol: Yes   Plan:  Bolus heparin 2500 units and increase infusion to 1500 units/hr. Will recheck a HL in 6 hours.   Ulice Dash, PharmD Clinical Pharmacist  07/30/2016

## 2016-07-30 NOTE — Progress Notes (Signed)
ANTICOAGULATION CONSULT NOTE - Initial Consult  Pharmacy Consult for heparin drip Indication: chest pain/ACS      Allergies  Allergen Reactions  . Ace Inhibitors Cough  . Latex Swelling    Patient Measurements: Height: 5\' 3"  (160 cm) Weight: 270 lb (122.5 kg) IBW/kg (Calculated) : 52.4 Heparin Dosing Weight: 83 kg  Vital Signs: Temp: 98.4 F (36.9 C) (03/31 1632) Temp Source: Oral (03/31 1632) BP: 165/88 (03/31 1738) Pulse Rate: 96 (03/31 1738)  Labs:  Recent Labs (last 2 labs)    Recent Labs  07/29/16 1723  HGB 9.5*  HCT 31.0*  PLT 381  CREATININE 2.00*  TROPONINI 2.54*      Estimated Creatinine Clearance: 45.1 mL/min (A) (by C-G formula based on SCr of 2 mg/dL (H)).   Medical History:     Past Medical History:  Diagnosis Date  . Anemia   . Hypertension     Assessment: 45 yo female with  chest pain/ACS. Pharmacy consulted for heparin dosing and monitoring.   Goal of Therapy:  Heparin level 0.3-0.7 units/ml Monitor platelets by anticoagulation protocol: Yes   Plan:  Baseline labs ordered. Dosing weight: 83kg Give 4000 units bolus x 1 Start heparin infusion at 1000 units/hr Check anti-Xa level in 6 hours and daily while on heparin Continue to monitor H&H and platelets  4/1 @ 0130 HL 0.10 subtherapeutic. Will bolus w/ heparin 2500 unit IV x 1, increase rate to 1200 unit/hour and recheck HL @ 0730. Will continue to monitor CBC.  Thank you for this consult.  Tobie Lords, PharmD, BCPS Clinical Pharmacist 07/30/2016

## 2016-07-30 NOTE — Progress Notes (Signed)
ANTICOAGULATION CONSULT NOTE - Initial Consult  Pharmacy Consult for heparin drip Indication: chest pain/ACS      Allergies  Allergen Reactions  . Ace Inhibitors Cough  . Latex Swelling    Patient Measurements: Height: 5\' 3"  (160 cm) Weight: 270 lb (122.5 kg) IBW/kg (Calculated) : 52.4 Heparin Dosing Weight: 83 kg  Vital Signs: Temp: 98.4 F (36.9 C) (03/31 1632) Temp Source: Oral (03/31 1632) BP: 165/88 (03/31 1738) Pulse Rate: 96 (03/31 1738)  Labs:  HL= 0.31  CBC Latest Ref Rng & Units 07/30/2016 07/29/2016 06/14/2016  WBC 3.6 - 11.0 K/uL 7.2 7.9 8.6  Hemoglobin 12.0 - 16.0 g/dL 8.8(L) 9.5(L) 9.6(L)  Hematocrit 35.0 - 47.0 % 29.1(L) 31.0(L) 32.3(L)  Platelets 150 - 440 K/uL 342 381 423   Medical History:     Past Medical History:  Diagnosis Date  . Anemia   . Hypertension    Assessment: 46 yo female with  chest pain/ACS. Pharmacy consulted for heparin dosing and monitoring.   Goal of Therapy:  Heparin level 0.3-0.7 units/ml Monitor platelets by anticoagulation protocol: Yes   Plan:  Continue heparin infusion at 1200 units/hr and check a confirmatory level in 6 hours.   Ulice Dash, PharmD Clinical Pharmacist  07/30/2016

## 2016-07-30 NOTE — Progress Notes (Signed)
[facility] [place] NAME: [name]    MR#:  [id number]  DATE OF BIRTH:  Sep 24, 1970  SUBJECTIVE:  CHIEF COMPLAINT:   Chief Complaint  Patient presents with  . Numbness   Heartburning, no obvious chest pain or palpitation. REVIEW OF SYSTEMS:  Review of Systems  Constitutional: Negative for chills, fever and malaise/fatigue.  HENT: Negative for congestion.   Eyes: Negative for blurred vision and double vision.  Respiratory: Negative for cough, shortness of breath and stridor.   Cardiovascular: Negative for chest pain, palpitations and leg swelling.  Gastrointestinal: Positive for heartburn. Negative for abdominal pain, blood in stool, diarrhea, melena, nausea and vomiting.  Genitourinary: Negative for dysuria and hematuria.  Musculoskeletal: Negative for back pain.  Skin: Negative for itching and rash.  Neurological: Negative for dizziness, focal weakness, loss of consciousness, weakness and headaches.  Psychiatric/Behavioral: Negative for depression. The patient is not nervous/anxious.     DRUG ALLERGIES:   Allergies  Allergen Reactions  . Ace Inhibitors Cough  . Latex Swelling   VITALS:  Blood pressure (!) 159/102, pulse 79, temperature 97.7 F (36.5 C), temperature source Oral, resp. rate 16, height 5\' 3"  (1.6 m), weight 267 lb 8 oz (121.3 kg), SpO2 97 %. PHYSICAL EXAMINATION:  Physical Exam  Constitutional:  Morbid obesity.  HENT:  Head: Normocephalic.  Mouth/Throat: Oropharynx is clear and moist.  Eyes: Conjunctivae and EOM are normal.  Neck: Normal range of motion. Neck supple. No JVD present. No tracheal deviation present.  Cardiovascular: Normal rate, regular rhythm and normal heart sounds.  Exam reveals no gallop.   No murmur heard. Pulmonary/Chest: Effort normal and breath sounds normal. No respiratory distress. She has no wheezes. She has no rales.  Abdominal: Soft. Bowel sounds are normal. She exhibits no  distension. There is no tenderness.  Musculoskeletal: Normal range of motion. She exhibits no edema or tenderness.  Neurological: She is alert. No cranial nerve deficit. Gait normal.  Skin: No rash noted. No erythema.  Psychiatric: Affect and judgment normal.   LABORATORY PANEL:  Female CBC  Recent Labs Lab 07/30/16 0135  WBC 7.2  HGB 8.8*  HCT 29.1*  PLT 342   ------------------------------------------------------------------------------------------------------------------ Chemistries   Recent Labs Lab 07/29/16 1723 07/30/16 0135  NA 137 139  K 2.9* 3.3*  CL 103 105  CO2 26 27  GLUCOSE 96 96  BUN 21* 21*  CREATININE 2.00* 2.13*  CALCIUM 10.1 9.8  AST 78*  --   ALT 59*  --   ALKPHOS 59  --   BILITOT 0.4  --    RADIOLOGY:  Dg Chest 2 View  Result Date: 07/29/2016 CLINICAL DATA:  Left-sided arm pain for 1 week. EXAM: CHEST  2 VIEW COMPARISON:  None. FINDINGS: There is no focal parenchymal opacity. There is no pleural effusion or pneumothorax. The heart and mediastinal contours are unremarkable. The osseous structures are unremarkable. IMPRESSION: No active cardiopulmonary disease. Electronically Signed   By: Kathreen Devoid   On: 07/29/2016 17:28   Ct Head Wo Contrast  Result Date: 07/29/2016 CLINICAL DATA:  LEFT-sided numbness for 1 week EXAM: CT HEAD WITHOUT CONTRAST TECHNIQUE: Contiguous axial images were obtained from the base of the skull through the vertex without intravenous contrast. COMPARISON:  None FINDINGS: Brain: Normal ventricular morphology. No midline shift or mass effect. Normal appearance of brain parenchyma. No intracranial hemorrhage, evidence of acute infarction, or extra-axial fluid collection. 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 Calcified extra-axial mass at the posterior parietal region on RIGHT 11 x 12 x 11 mm most consistent with meningioma. Vascular: Unremarkable Skull: Intact Sinuses/Orbits: Clear Other: N/A IMPRESSION: No  acute intracranial abnormalities. 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 Electronically Signed   By: Lavonia Dana M.D.   On: 07/29/2016 17:32   ASSESSMENT AND PLAN:   * Non-ST elevation MI   Continue IV heparin drip. Troponin is 3.87, even on heparin drip. Continue aspirin, metoprolol and lipitor. Per Dr. Humphrey Rolls, She would also need antiplatelet including Plavix and aspirin which she will have difficulty tolerating. She refused to go through cardiac catheterization. Follow-up echo. f/u hemoglobin A1c.  * HLP. LDL 109,  lipitor.  * Hypertension   Continue metoprolol, discontinue losartan, start hydralazine and Imdur.  * Anemia of chronic disease- iron deficiency. Hb is stable at 8.8.   Continue oral iron supplements. Stool occult. GI consult and consider transfusing until Hb of 10 per Dr. Humphrey Rolls.  * Acute on chronic renal failure CKD stage III    continue IV fluid and follow-up BMP  Hypokalemia. Give potassium supplement and follow-up BMP.  All the records are reviewed and case discussed with Care Management/Social Worker. Management plans discussed with the patient, family and they are in agreement.  CODE STATUS: Full Code  TOTAL TIME TAKING CARE OF THIS PATIENT: 36 minutes.   More than 50% of the time was spent in counseling/coordination of care: YES  POSSIBLE D/C IN 2 DAYS, DEPENDING ON CLINICAL CONDITION.   Demetrios Loll M.D on 07/30/2016 at 11:22 AM  Between 7am to 6pm - Pager - [phone]  After 6pm go to [email or web address] - Technical brewer [facility]  Office  [phone]  CC: Primary care physician; [facility]  Note: This dictation was prepared with Dragon dictation along with smaller phrase technology. Any transcriptional errors that result from this process are unintentional.

## 2016-07-30 NOTE — Progress Notes (Signed)
Patient c/o chest pain 8 out of 10 midsternal and under left breast. 1 sublingual nitro given. Chest pain resolved. EKG ordered and performed. MD notified. Will continue to monitor.

## 2016-07-30 NOTE — Progress Notes (Signed)
Chaplain responded to a consult for Prayer for a Pt in Rm 240A. CH met with the Pt. Pt stated she had been taking care of her mother and brother who have dementia, and ex-boyfriend who have diabetes and have neglected taking care of herself. Pt had a a breakdown. Pt claims she had a mild stroke, which she thinks was a wakeup call for her. Pt was doing well this morning and plan to do more for herself to avoid health breakdown in future. [place] provided prayers, postoral support, and presence.    07/30/16 0800  Clinical Encounter Type  Visited With Patient  Visit Type Initial;Spiritual support  Referral From Nurse  Consult/Referral To Chaplain  Spiritual Encounters  Spiritual Needs Prayer

## 2016-07-30 NOTE — Progress Notes (Signed)
ANTICOAGULATION CONSULT NOTE - Initial Consult  Pharmacy Consult for heparin drip Indication: chest pain/ACS      Allergies  Allergen Reactions  . Ace Inhibitors Cough  . Latex Swelling    Patient Measurements: Height: 5\' 3"  (160 cm) Weight: 270 lb (122.5 kg) IBW/kg (Calculated) : 52.4 Heparin Dosing Weight: 83 kg  Vital Signs: Temp: 98.4 F (36.9 C) (03/31 1632) Temp Source: Oral (03/31 1632) BP: 165/88 (03/31 1738) Pulse Rate: 96 (03/31 1738)  Labs:  HL= 0.14  CBC Latest Ref Rng & Units 07/30/2016 07/29/2016 06/14/2016  WBC 3.6 - 11.0 K/uL 7.2 7.9 8.6  Hemoglobin 12.0 - 16.0 g/dL 8.8(L) 9.5(L) 9.6(L)  Hematocrit 35.0 - 47.0 % 29.1(L) 31.0(L) 32.3(L)  Platelets 150 - 440 K/uL 342 381 423   Medical History:     Past Medical History:  Diagnosis Date  . Anemia   . Hypertension    Assessment: 47 yo female with  chest pain/ACS. Pharmacy consulted for heparin dosing and monitoring.   Goal of Therapy:  Heparin level 0.3-0.7 units/ml Monitor platelets by anticoagulation protocol: Yes   Plan:  Bolus heparin 2500 units and increase infusion to 1500 units/hr. Will recheck a HL in 6 hours.   07/30/16 2028 heparin level subtherapeutic x 1. 1250 units IV x 1 bolus and increase rate to 1650 units/hr. Will recheck HL in 6 hours.  Marinna Blane A. [place], [place].D., BCPS Clinical Pharmacist  07/30/2016

## 2016-07-31 ENCOUNTER — Inpatient Hospital Stay
Admit: 2016-07-31 | Discharge: 2016-07-31 | Disposition: A | Discharge status: Home / Self Care | Payer: Self-pay | Attending: Cardiovascular Disease | Admitting: Cardiovascular Disease

## 2016-07-31 DIAGNOSIS — D500 Iron deficiency anemia secondary to blood loss (chronic): Secondary | ICD-10-CM

## 2016-07-31 DIAGNOSIS — I5189 Other ill-defined heart diseases: Secondary | ICD-10-CM

## 2016-07-31 HISTORY — DX: Other ill-defined heart diseases: I51.89

## 2016-07-31 LAB — PROTEIN / CREATININE RATIO, URINE
CREATININE, URINE: 75 mg/dL
Protein Creatinine Ratio: 1.68 mg/mg{Cre} — ABNORMAL HIGH (ref 0.00–0.15)
Total Protein, Urine: 126 mg/dL

## 2016-07-31 LAB — CBC
HCT: 28.5 % — ABNORMAL LOW (ref 35.0–47.0)
Hemoglobin: 8.6 g/dL — ABNORMAL LOW (ref 12.0–16.0)
MCH: 21 pg — ABNORMAL LOW (ref 26.0–34.0)
MCHC: 30.3 g/dL — AB (ref 32.0–36.0)
MCV: 69.4 fL — ABNORMAL LOW (ref 80.0–100.0)
Platelets: 348 10*3/uL (ref 150–440)
RBC: 4.1 MIL/uL (ref 3.80–5.20)
RDW: 21.6 % — AB (ref 11.5–14.5)
WBC: 6.9 10*3/uL (ref 3.6–11.0)

## 2016-07-31 LAB — PREPARE RBC (CROSSMATCH)

## 2016-07-31 LAB — HIV ANTIBODY (ROUTINE TESTING W REFLEX): HIV Screen 4th Generation wRfx: NONREACTIVE

## 2016-07-31 LAB — BASIC METABOLIC PANEL
Anion gap: 6 (ref 5–15)
BUN: 19 mg/dL (ref 6–20)
CHLORIDE: 108 mmol/L (ref 101–111)
CO2: 24 mmol/L (ref 22–32)
Calcium: 9.8 mg/dL (ref 8.9–10.3)
Creatinine, Ser: 1.74 mg/dL — ABNORMAL HIGH (ref 0.44–1.00)
GFR calc non Af Amer: 34 mL/min — ABNORMAL LOW (ref >60)
GFR, EST AFRICAN AMERICAN: 40 mL/min — AB (ref >60)
GLUCOSE: 75 mg/dL (ref 65–99)
Potassium: 4.4 mmol/L (ref 3.5–5.1)
Sodium: 138 mmol/L (ref 135–145)

## 2016-07-31 LAB — ECHOCARDIOGRAM COMPLETE
Height: 63 in
WEIGHTICAEL: 4280 [oz_av]

## 2016-07-31 LAB — HEPARIN LEVEL (UNFRACTIONATED)
HEPARIN UNFRACTIONATED: 0.41 [IU]/mL (ref 0.30–0.70)
Heparin Unfractionated: 0.37 IU/mL (ref 0.30–0.70)

## 2016-07-31 LAB — HEMOGLOBIN A1C
HEMOGLOBIN A1C: 5.3 % (ref 4.8–5.6)
MEAN PLASMA GLUCOSE: 105 mg/dL

## 2016-07-31 MED ORDER — HYDRALAZINE HCL 50 MG PO TABS
100.0000 mg | ORAL_TABLET | Freq: Three times a day (TID) | ORAL | Status: DC
  Administered 2016-07-31 – 2016-08-02 (×4): 100 mg via ORAL
  Filled 2016-07-31 (×5): qty 2

## 2016-07-31 MED ORDER — SODIUM CHLORIDE 0.9 % IV SOLN
Freq: Once | INTRAVENOUS | Status: AC
  Administered 2016-07-31: 15:00:00 via INTRAVENOUS

## 2016-07-31 MED ORDER — ACETAMINOPHEN 325 MG PO TABS
650.0000 mg | ORAL_TABLET | Freq: Once | ORAL | Status: AC
  Administered 2016-07-31: 650 mg via ORAL
  Filled 2016-07-31: qty 2

## 2016-07-31 NOTE — Progress Notes (Signed)
[facility] for heparin drip Indication: chest pain/ACS      Allergies  Allergen Reactions  . Ace Inhibitors Cough  . Latex Swelling    Patient Measurements: Height: 5\' 3"  (160 cm) Weight: 270 lb (122.5 kg) IBW/kg (Calculated) : 52.4 Heparin Dosing Weight: 83 kg  Vital Signs: Temp: 98.4 F (36.9 C) (03/31 1632) Temp Source: Oral (03/31 1632) BP: 165/88 (03/31 1738) Pulse Rate: 96 (03/31 1738)  Labs:  HL= 0.14  CBC Latest Ref Rng & Units 07/30/2016 07/29/2016 06/14/2016  WBC 3.6 - 11.0 K/uL 7.2 7.9 8.6  Hemoglobin 12.0 - 16.0 g/dL 8.8(L) 9.5(L) 9.6(L)  Hematocrit 35.0 - 47.0 % 29.1(L) 31.0(L) 32.3(L)  Platelets 150 - 440 K/uL 342 381 423   Medical History:     Past Medical History:  Diagnosis Date  . Anemia   . Hypertension    Assessment: 46 yo female with chest pain/ACS. Pharmacy consulted for heparin dosing and monitoring.   Currently on Heparin 1650 units/hr 4/2 10:30 Heparin level resulted at 0.37  Goal of Therapy: Heparin level 0.3-0.7 units/ml Monitor platelets by anticoagulation protocol: Yes  Plan: Will continue with current rate of 1650 units/hr. Patient has had 2 consecutive therapeutic Heparin levels, next Heparin level and CBC with AM labs.  Paulina Fusi, PharmD, BCPS 07/31/2016 11:21 AM

## 2016-07-31 NOTE — Care Management (Signed)
Admitted with nstemi and anemia with hemoglobin trending down to 8.6.  During progression it was reported that patient is declining cath.  Anemia and receiving one unit of packed cells. has required transfusions in the past. Has been evaluated by gi and will schedule EGD and colonoscopy as an outpatient to allow her to recover from MI.  She lives with her mother and works at [employer].  She is not followed by any physician and has not been seen by a doctor "for a long time."  Discussed [facility] and provided applications. Nephrology consult pending.

## 2016-07-31 NOTE — Progress Notes (Signed)
ANTICOAGULATION CONSULT NOTE - Initial Consult  Pharmacy Consult for heparin drip Indication: chest pain/ACS      Allergies  Allergen Reactions  . Ace Inhibitors Cough  . Latex Swelling    Patient Measurements: Height: 5\' 3"  (160 cm) Weight: 270 lb (122.5 kg) IBW/kg (Calculated) : 52.4 Heparin Dosing Weight: 83 kg  Vital Signs: Temp: 98.4 F (36.9 C) (03/31 1632) Temp Source: Oral (03/31 1632) BP: 165/88 (03/31 1738) Pulse Rate: 96 (03/31 1738)  Labs:  HL= 0.14  CBC Latest Ref Rng & Units 07/30/2016 07/29/2016 06/14/2016  WBC 3.6 - 11.0 K/uL 7.2 7.9 8.6  Hemoglobin 12.0 - 16.0 g/dL 8.8(L) 9.5(L) 9.6(L)  Hematocrit 35.0 - 47.0 % 29.1(L) 31.0(L) 32.3(L)  Platelets 150 - 440 K/uL 342 381 423   Medical History:     Past Medical History:  Diagnosis Date  . Anemia   . Hypertension    Assessment: 46 yo female with chest pain/ACS. Pharmacy consulted for heparin dosing and monitoring.   Goal of Therapy: Heparin level 0.3-0.7 units/ml Monitor platelets by anticoagulation protocol: Yes  Plan: Bolus heparin 2500 units and increase infusion to 1500 units/hr. Will recheck a HL in 6 hours.   07/30/16 2028 heparin level subtherapeutic x 1. 1250 units IV x 1 bolus and increase rate to 1650 units/hr. Will recheck HL in 6 hours.  4/2 @ 0430 HL 0.41 therapeutic will continue w/ current regimen and recheck in 6 hours.  Thank you for this consult.  Tobie Lords, PharmD, BCPS Clinical Pharmacist 07/31/2016

## 2016-07-31 NOTE — Consult Note (Signed)
Lauren Lame, MD [facility]  [address] [place], [place] [address] Phone: [phone] Fax : [phone]  Consultation  Referring Provider:     Dr. Bridgett Larsson Primary Care Physician:  [facility] Primary Gastroenterologist:  None.         Reason for Consultation:     Anemia  Date of Admission:  07/29/2016 Date of Consultation:  07/31/2016         HPI:   [name] is a 46 y.o. female who was admitted with a report of numbness in her left upper extremity. The patient was evaluated by cardiology due to an elevated troponin. The patient was also noted to have an increased creatinine. The patient has had chronic anemia for many years. The patient's last workup for anemia was in 2011 with an EGD and colonoscopy at that time. The colonoscopy only showed her to have diverticulosis. The patient reports that she has been told that her anemia was due to her heavy menstrual cycle. She was put on birth control for this and states that the bleeding is much less but she still has constant spotting. She denies any unexplained change in bowel habits or weight loss. The patient was seen by cardiology and started on heparin. The patient's hemoglobin back in May 2017 was 6.4 with her being transfused up to 8.7. The patient's hemoglobin in February of this year was 9.6. The patient was now admitted with a hemoglobin of 9.5 and has trended down to 8.6. The patient denies any black stools or bloody stools.  Past Medical History:  Diagnosis Date  . Anemia   . Hypertension     History reviewed. No pertinent surgical history.  Prior to Admission medications   Medication Sig Start Date End Date Taking? Authorizing Provider  ferrous sulfate (FERROUSUL) 325 (65 FE) MG tablet Take 1 tablet (325 mg total) by mouth 2 (two) times daily with a meal. 09/14/15  Yes Gwen Her Schermerhorn, MD  losartan-hydrochlorothiazide (HYZAAR) 100-25 MG tablet Take 1 tablet by mouth daily.   Yes Historical Provider,  MD  metoprolol tartrate (LOPRESSOR) 25 MG tablet Take 1 tablet (25 mg total) by mouth 2 (two) times daily. 09/14/15  Yes Gwen Her Schermerhorn, MD  norethindrone-ethinyl estradiol 1/35 ([place] 1/35, 28,) tablet Take 1 tablet by mouth daily. 1 po tid x 3 days then 1 po bid x 2 days then qd . Dispose of the last 7 tabs and restart new pack 09/14/15  Yes Gwen Her Schermerhorn, MD  ondansetron (ZOFRAN) 4 MG tablet Take 1 tablet (4 mg total) by mouth every 6 (six) hours as needed for nausea. 09/14/15  Yes Gwen Her Schermerhorn, MD  cloNIDine (CATAPRES) 0.2 MG tablet Take 1 tablet (0.2 mg total) by mouth 2 (two) times daily. Patient not taking: Reported on 07/29/2016 06/14/16 06/14/17  Daymon Larsen, MD  docusate sodium (COLACE) 100 MG capsule Take 1 capsule (100 mg total) by mouth 2 (two) times daily. Patient not taking: Reported on 07/29/2016 09/14/15   Gwen Her Schermerhorn, MD  ferrous sulfate 325 (65 FE) MG tablet Take 325 mg by mouth 3 (three) times daily with meals.    Historical Provider, MD  losartan-hydrochlorothiazide (HYZAAR) 100-25 MG tablet Take 1 tablet by mouth daily. Patient not taking: Reported on 07/29/2016 09/14/15   Gwen Her Schermerhorn, MD  metoprolol tartrate (LOPRESSOR) 25 MG tablet Take 25 mg by mouth 2 (two) times daily.    Historical Provider, MD    Family History  Problem  Relation Age of Onset  . CAD Father      Social History  Substance Use Topics  . Smoking status: Never Smoker  . Smokeless tobacco: Never Used  . Alcohol use No    Allergies as of 07/29/2016 - Review Complete 07/29/2016  Allergen Reaction Noted  . Ace inhibitors Cough 04/12/2007  . Latex Swelling 09/13/2015    Review of Systems:    All systems reviewed and negative except where noted in HPI.   Physical Exam:  Vital signs in last 24 hours: Temp:  [97.7 F (36.5 C)-98.3 F (36.8 C)] 97.7 F (36.5 C) (04/02 0733) Pulse Rate:  [72-80] 72 (04/02 0733) Resp:  [16-19] 16 (04/02 0733) BP:  (145-189)/(78-95) 163/95 (04/02 0733) SpO2:  [97 %-100 %] 100 % (04/02 0733) Last BM Date: 07/28/16 General:   Pleasant, cooperative in NAD Head:  Normocephalic and atraumatic. Eyes:   No icterus.   Conjunctiva pink. PERRLA. Ears:  Normal auditory acuity. Neck:  Supple; no masses or thyroidomegaly Lungs: Respirations even and unlabored. Lungs clear to auscultation bilaterally.   No wheezes, crackles, or rhonchi.  Heart:  Regular rate and rhythm;  Without murmur, clicks, rubs or gallops Abdomen:  Soft, nondistended, nontender. Normal bowel sounds. No appreciable masses or hepatomegaly.  No rebound or guarding.  Rectal:  Not performed. Msk:  Symmetrical without gross deformities.    Extremities:  Without edema, cyanosis or clubbing. Neurologic:  Alert and oriented x3;  grossly normal neurologically. Skin:  Intact without significant lesions or rashes. Cervical Nodes:  No significant cervical adenopathy. Psych:  Alert and cooperative. Normal affect.  LAB RESULTS:  Recent Labs  07/29/16 1723 07/30/16 0135 07/31/16 0429  WBC 7.9 7.2 6.9  HGB 9.5* 8.8* 8.6*  HCT 31.0* 29.1* 28.5*  PLT 381 342 348   BMET  Recent Labs  07/29/16 1723 07/30/16 0135 07/31/16 0429  NA 137 139 138  K 2.9* 3.3* 4.4  CL 103 105 108  CO2 26 27 24   GLUCOSE 96 96 75  BUN 21* 21* 19  CREATININE 2.00* 2.13* 1.74*  CALCIUM 10.1 9.8 9.8   LFT  Recent Labs  07/29/16 1723  PROT 7.8  ALBUMIN 3.1*  AST 78*  ALT 59*  ALKPHOS 59  BILITOT 0.4   PT/INR  Recent Labs  07/29/16 1819  LABPROT 12.0  INR 0.89    STUDIES: Dg Chest 2 View  Result Date: 07/29/2016 CLINICAL DATA:  Left-sided arm pain for 1 week. EXAM: CHEST  2 VIEW COMPARISON:  None. FINDINGS: There is no focal parenchymal opacity. There is no pleural effusion or pneumothorax. The heart and mediastinal contours are unremarkable. The osseous structures are unremarkable. IMPRESSION: No active cardiopulmonary disease. Electronically  Signed   By: Kathreen Devoid   On: 07/29/2016 17:28   Ct Head Wo Contrast  Result Date: 07/29/2016 CLINICAL DATA:  LEFT-sided numbness for 1 week EXAM: CT HEAD WITHOUT CONTRAST TECHNIQUE: Contiguous axial images were obtained from the base of the skull through the vertex without intravenous contrast. COMPARISON:  None FINDINGS: Brain: Normal ventricular morphology. No midline shift or mass effect. Normal appearance of brain parenchyma. No intracranial hemorrhage, evidence of acute infarction, or extra-axial fluid collection. 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 Calcified extra-axial mass at the posterior parietal region on RIGHT 11 x 12 x 11 mm most consistent with meningioma. Vascular: Unremarkable Skull: Intact Sinuses/Orbits: Clear Other: N/A IMPRESSION: No acute intracranial abnormalities. 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 Probable small meningioma  11 x 12 x 11 mm at the high posterior RIGHT parietal region. Electronically Signed   By: Lavonia Dana M.D.   On: 07/29/2016 17:32      Impression / Plan:   [name] is a 46 y.o. y/o female with Chronic anemia with her lowest hemoglobin being 6.4 last year in May. The patient also had anemia back in 2011 with an EGD and colonoscopy at that time that did not show any sign of GI blood loss. The patient does report that she has had vaginal bleeding which had been heavy in the past but has slowed down since she's been on birth control but she still has chronic spotting. Visit very high risk for any endoscopic procedures at this time due to her recent MI. The patient has been given my card and has been told to follow-up for an EGD and colonoscopy in 6 weeks. As far as any sign of acute bleeding may necessitate sooner intervention. The patient should have her stools checked for blood while she is here. She reports that she has not had a bowel movement since being admitted. I agree transfusing the patient at this time  would be warranted.   Thank you for involving me in the care of this patient.      LOS: 2 days   Lauren Lame, MD  07/31/2016, 12:36 PM   Note: This dictation was prepared with Dragon dictation along with smaller phrase technology. Any transcriptional errors that result from this process are unintentional.

## 2016-07-31 NOTE — Progress Notes (Signed)
SUBJECTIVE: Pt is feeling well. No chest pain or shortness of breath or tingling of arms.   Vitals:   07/30/16 1956 07/31/16 0015 07/31/16 0419 07/31/16 0733  BP: (!) 174/91 (!) 189/94 (!) 145/78 (!) 163/95  Pulse: 80 79 75 72  Resp: 19  18 16   Temp: 98.3 F (36.8 C)  98.3 F (36.8 C) 97.7 F (36.5 C)  TempSrc: Oral  Oral Oral  SpO2: 99%  99% 100%  Weight:      Height:        Intake/Output Summary (Last 24 hours) at 07/31/16 0912 Last data filed at 07/31/16 0426  Gross per 24 hour  Intake              360 ml  Output             1400 ml  Net            -1040 ml    LABS: Basic Metabolic Panel:  Recent Labs  07/30/16 0135 07/31/16 0429  NA 139 138  K 3.3* 4.4  CL 105 108  CO2 27 24  GLUCOSE 96 75  BUN 21* 19  CREATININE 2.13* 1.74*  CALCIUM 9.8 9.8  MG 1.7  --    Liver Function Tests:  Recent Labs  07/29/16 1723  AST 78*  ALT 59*  ALKPHOS 59  BILITOT 0.4  PROT 7.8  ALBUMIN 3.1*   No results for input(s): LIPASE, AMYLASE in the last 72 hours. CBC:  Recent Labs  07/29/16 1723 07/30/16 0135 07/31/16 0429  WBC 7.9 7.2 6.9  NEUTROABS 5.2  --   --   HGB 9.5* 8.8* 8.6*  HCT 31.0* 29.1* 28.5*  MCV 67.6* 68.0* 69.4*  PLT 381 342 348   Cardiac Enzymes:  Recent Labs  07/29/16 1917 07/29/16 2254 07/30/16 0135  TROPONINI 3.02* 3.87* 3.80*   BNP: Invalid input(s): POCBNP D-Dimer: No results for input(s): DDIMER in the last 72 hours. Hemoglobin A1C: No results for input(s): HGBA1C in the last 72 hours. Fasting Lipid Panel:  Recent Labs  07/29/16 1753  CHOL 190  HDL 52  LDLCALC 109*  TRIG 143  CHOLHDL 3.7   Thyroid Function Tests: No results for input(s): TSH, T4TOTAL, T3FREE, THYROIDAB in the last 72 hours.  Invalid input(s): FREET3 Anemia Panel: No results for input(s): VITAMINB12, FOLATE, FERRITIN, TIBC, IRON, RETICCTPCT in the last 72 hours.   PHYSICAL EXAM General: Well developed, well nourished, in no acute distress HEENT:   Normocephalic and atramatic Neck:  No JVD.  Lungs: Clear bilaterally to auscultation and percussion. Heart: HRRR . Normal S1 and S2 without gallops or murmurs.  Abdomen: Bowel sounds are positive, abdomen soft and non-tender  Extremities: No clubbing, cyanosis or edema.   Neuro: Alert and oriented X 3. Psych:  Good affect, responds appropriately  TELEMETRY: NSR 70bpm  ASSESSMENT AND PLAN: No chest pain. Echo shows normal LVEF with moderate MR. Continue heparin drip, aspirin, and metoprolol. Transfuse to Hgb of 10. Blood pressure is high, consider increasing hydralazine. Active Problems:   NSTEMI (non-ST elevated myocardial infarction) ([place])    Jake Bathe, NP-C 07/31/2016 9:12 AM

## 2016-07-31 NOTE — Progress Notes (Signed)
*  PRELIMINARY RESULTS* Echocardiogram 2D Echocardiogram has been performed.  [name] 07/31/2016, 8:25 AM

## 2016-07-31 NOTE — Progress Notes (Signed)
[facility] [place] NAME: [name]    MR#:  [id number]  DATE OF BIRTH:  May 15, 1970  SUBJECTIVE:  CHIEF COMPLAINT:   Chief Complaint  Patient presents with  . Numbness   One episode of chest pain last night. No complaints since this morning. REVIEW OF SYSTEMS:  Review of Systems  Constitutional: Negative for chills, fever and malaise/fatigue.  HENT: Negative for congestion.   Eyes: Negative for blurred vision and double vision.  Respiratory: Negative for cough, shortness of breath and stridor.   Cardiovascular: Negative for chest pain, palpitations and leg swelling.  Gastrointestinal: Negative for abdominal pain, blood in stool, diarrhea, heartburn, melena, nausea and vomiting.  Genitourinary: Negative for dysuria and hematuria.  Musculoskeletal: Negative for back pain.  Skin: Negative for itching and rash.  Neurological: Negative for dizziness, focal weakness, loss of consciousness, weakness and headaches.  Psychiatric/Behavioral: Negative for depression. The patient is not nervous/anxious.     DRUG ALLERGIES:   Allergies  Allergen Reactions  . Ace Inhibitors Cough  . Latex Swelling   VITALS:  Blood pressure (!) 172/94, pulse 70, temperature 98 F (36.7 C), temperature source Oral, resp. rate 16, height 5\' 3"  (1.6 m), weight 267 lb 8 oz (121.3 kg), SpO2 100 %. PHYSICAL EXAMINATION:  Physical Exam  Constitutional:  Morbid obesity.  HENT:  Head: Normocephalic.  Mouth/Throat: Oropharynx is clear and moist.  Eyes: Conjunctivae and EOM are normal.  Neck: Normal range of motion. Neck supple. No JVD present. No tracheal deviation present.  Cardiovascular: Normal rate, regular rhythm and normal heart sounds.  Exam reveals no gallop.   No murmur heard. Pulmonary/Chest: Effort normal and breath sounds normal. No respiratory distress. She has no wheezes. She has no rales.  Abdominal: Soft. Bowel sounds are normal. She  exhibits no distension. There is no tenderness.  Musculoskeletal: Normal range of motion. She exhibits no edema or tenderness.  Neurological: She is alert. No cranial nerve deficit. Gait normal.  Skin: No rash noted. No erythema.  Psychiatric: Affect and judgment normal.   LABORATORY PANEL:  Female CBC  Recent Labs Lab 07/31/16 0429  WBC 6.9  HGB 8.6*  HCT 28.5*  PLT 348   ------------------------------------------------------------------------------------------------------------------ Chemistries   Recent Labs Lab 07/29/16 1723 07/30/16 0135 07/31/16 0429  NA 137 139 138  K 2.9* 3.3* 4.4  CL 103 105 108  CO2 26 27 24   GLUCOSE 96 96 75  BUN 21* 21* 19  CREATININE 2.00* 2.13* 1.74*  CALCIUM 10.1 9.8 9.8  MG  --  1.7  --   AST 78*  --   --   ALT 59*  --   --   ALKPHOS 59  --   --   BILITOT 0.4  --   --    RADIOLOGY:  No results found. ASSESSMENT AND PLAN:   * Non-ST elevation MI   Continue IV heparin drip. Troponin is 3.87, even on heparin drip. Continue aspirin, metoprolol and lipitor. Per Dr. Humphrey Rolls, She would also need antiplatelet including Plavix and aspirin which she will have difficulty tolerating. She refused to go through cardiac catheterization. Follow-up echo: Normal LVEF and wall motion with moderate MR. Hemoglobin A1c 5.3.  * HLP. LDL 109,  lipitor.  * Hypertension   Continue metoprolol, discontinue losartan, started hydralazine and Imdur.  * Anemia of chronic disease- iron deficiency. Hb is stable at 8.6.   Continue oral iron supplements. Stool occult.  Consider transfusing until Hb  of 10 per Dr. Humphrey Rolls. Per Dr. Allen Norris, agree transfusing the patient at this time would be warranted.   * Acute on chronic renal failure CKD stage III   improving with IV fluid and follow-up BMP  Hypokalemia. Improved with potassium supplement.  I discussed with Dr. Allen Norris. All the records are reviewed and case discussed with Care Management/Social Worker. Management  plans discussed with the patient, family and they are in agreement.  CODE STATUS: Full Code  TOTAL TIME TAKING CARE OF THIS PATIENT: 36 minutes.   More than 50% of the time was spent in counseling/coordination of care: YES  POSSIBLE D/C IN 2 DAYS, DEPENDING ON CLINICAL CONDITION.   Demetrios Loll M.D on 07/31/2016 at 2:28 PM  Between 7am to 6pm - Pager - [phone]  After 6pm go to [email or web address] - [employer] [facility]  Office  [phone]  CC: Primary care physician; [facility]  Note: This dictation was prepared with Dragon dictation along with smaller phrase technology. Any transcriptional errors that result from this process are unintentional.

## 2016-07-31 NOTE — Consult Note (Signed)
[facility] CONSULT NOTE    Date: 07/31/2016                  Patient Name:  [name]  MRN: [id number]  DOB: 11/08/1970  Age / Sex: 46 y.o., female         PCP: [facility]                 Service Requesting Consult: hospitalist                 Reason for Consult: Acute renal failure/CKD stage III            History of Present Illness: Patient is a 46 y.o. female with a PMHx of anemia and hypertension, who was admitted to [facility] on 07/29/2016 for evaluation of left arm numbness, palpitations, and shortness of breath.  Patient has stated recently that she's been having intermittent episodes where she has numbness in her left upper extremity associated with palpitations and shortness of breath.  She usually leaves work when she has these episodes.  Upon presentation here she was found to have an elevated troponin of 2.54.  This ultimately peaked at 3.80.  Cardiology has recommended a cardiac catheterization however patient has declined.  We are asked to see the patient for acute renal failure.  She has underlying chronic kidney disease with a baseline creatinine of 1.7.  Her brother actually has end-stage renal disease. Creatinine was as high as 2.13 but now is trended down to 1.7.   Medications: Outpatient medications: Prescriptions Prior to Admission  Medication Sig Dispense Refill Last Dose  . ferrous sulfate (FERROUSUL) 325 (65 FE) MG tablet Take 1 tablet (325 mg total) by mouth 2 (two) times daily with a meal. 60 tablet 3 unknown at unknown  . losartan-hydrochlorothiazide (HYZAAR) 100-25 MG tablet Take 1 tablet by mouth daily.   07/29/2016 at 0700  . metoprolol tartrate (LOPRESSOR) 25 MG tablet Take 1 tablet (25 mg total) by mouth 2 (two) times daily. 60 tablet 6 07/26/2016 at unknown  . norethindrone-ethinyl estradiol 1/35 ([place] 1/35, 28,) tablet Take 1 tablet by mouth daily. 1 po tid x 3 days then 1 po bid x 2 days then qd . Dispose  of the last 7 tabs and restart new pack 2 Package 11 unknown at unknown  . ondansetron (ZOFRAN) 4 MG tablet Take 1 tablet (4 mg total) by mouth every 6 (six) hours as needed for nausea. 30 tablet 0 prn at prn  . cloNIDine (CATAPRES) 0.2 MG tablet Take 1 tablet (0.2 mg total) by mouth 2 (two) times daily. (Patient not taking: Reported on 07/29/2016) 30 tablet 11 Not Taking at Unknown time  . docusate sodium (COLACE) 100 MG capsule Take 1 capsule (100 mg total) by mouth 2 (two) times daily. (Patient not taking: Reported on 07/29/2016) 60 capsule 0 Not Taking at Unknown time  . ferrous sulfate 325 (65 FE) MG tablet Take 325 mg by mouth 3 (three) times daily with meals.   Not Taking at Unknown time  . losartan-hydrochlorothiazide (HYZAAR) 100-25 MG tablet Take 1 tablet by mouth daily. (Patient not taking: Reported on 07/29/2016) 30 tablet 6 Not Taking at Unknown time  . metoprolol tartrate (LOPRESSOR) 25 MG tablet Take 25 mg by mouth 2 (two) times daily.   Not Taking at Unknown time    Current medications: Current Facility-Administered Medications  Medication Dose Route Frequency Provider Last Rate Last Dose  . 0.9 %  sodium  chloride infusion   Intravenous Continuous Vaughan Basta, MD 75 mL/hr at 07/31/16 1202    . aspirin EC tablet 325 mg  325 mg Oral Daily Vaughan Basta, MD   325 mg at 07/31/16 0846  . atorvastatin (LIPITOR) tablet 40 mg  40 mg Oral q1800 Demetrios Loll, MD   40 mg at 07/30/16 1726  . calcium carbonate (TUMS - dosed in mg elemental calcium) chewable tablet 200 mg of elemental calcium  1 tablet Oral TID WC Demetrios Loll, MD   200 mg of elemental calcium at 07/31/16 1146  . docusate sodium (COLACE) capsule 100 mg  100 mg Oral BID Vaughan Basta, MD   100 mg at 07/31/16 0846  . docusate sodium (COLACE) capsule 100 mg  100 mg Oral BID PRN Vaughan Basta, MD      . ferrous sulfate tablet 325 mg  325 mg Oral TID WC Vaughan Basta, MD   325 mg at 07/31/16 1146  .  heparin ADULT infusion 100 units/mL (25000 units/259mL sodium chloride 0.45%)  1,650 Units/hr Intravenous Continuous Demetrios Loll, MD 16.5 mL/hr at 07/30/16 2150 1,650 Units/hr at 07/30/16 2150  . hydrALAZINE (APRESOLINE) tablet 100 mg  100 mg Oral Q8H Dionisio David, MD      . isosorbide mononitrate (IMDUR) 24 hr tablet 60 mg  60 mg Oral Daily Dionisio David, MD   60 mg at 07/31/16 0846  . labetalol (NORMODYNE,TRANDATE) injection 10 mg  10 mg Intravenous Q2H PRN Lance Coon, MD   10 mg at 07/30/16 7858  . metoprolol (LOPRESSOR) tablet 100 mg  100 mg Oral BID Dionisio David, MD   100 mg at 07/31/16 0846  . nitroGLYCERIN (NITROSTAT) SL tablet 0.4 mg  0.4 mg Sublingual Q5 min PRN Vaughan Basta, MD   0.4 mg at 07/30/16 1622  . ondansetron (ZOFRAN) tablet 4 mg  4 mg Oral Q6H PRN Vaughan Basta, MD      . potassium chloride SA (K-DUR,KLOR-CON) CR tablet 40 mEq  40 mEq Oral BID Vaughan Basta, MD   40 mEq at 07/31/16 0846  . ranolazine (RANEXA) 12 hr tablet 500 mg  500 mg Oral BID Dionisio David, MD   500 mg at 07/31/16 0846  . sodium chloride flush (NS) 0.9 % injection 3 mL  3 mL Intravenous Q12H Vaughan Basta, MD   3 mL at 07/31/16 0847      Allergies: Allergies  Allergen Reactions  . Ace Inhibitors Cough  . Latex Swelling      Past Medical History: Past Medical History:  Diagnosis Date  . Anemia   . Hypertension      Past Surgical History: History reviewed. No pertinent surgical history.   Family History: Family History  Problem Relation Age of Onset  . CAD Father      Social History: Social History   Social History  . Marital status: Single    Spouse name: N/A  . Number of children: N/A  . Years of education: N/A   Occupational History  . Not on file.   Social History Main Topics  . Smoking status: Never Smoker  . Smokeless tobacco: Never Used  . Alcohol use No  . Drug use: Unknown  . Sexual activity: Not on file   Other Topics  Concern  . Not on file   Social History Narrative  . No narrative on file     Review of Systems: Review of Systems  Constitutional: Positive for malaise/fatigue. Negative for chills and fever.  HENT: Negative for ear discharge, hearing loss and nosebleeds.   Eyes: Negative for blurred vision and double vision.  Respiratory: Positive for shortness of breath. Negative for cough, hemoptysis and sputum production.   Cardiovascular: Positive for chest pain and palpitations. Negative for claudication.  Gastrointestinal: Negative for heartburn, nausea and vomiting.  Genitourinary: Negative for dysuria and urgency.  Musculoskeletal: Negative for back pain and myalgias.  Skin: Negative for itching and rash.  Neurological: Negative for dizziness and focal weakness.  Endo/Heme/Allergies: Negative for polydipsia. Does not bruise/bleed easily.  Psychiatric/Behavioral: Negative for depression. The patient is not nervous/anxious.      Vital Signs: Blood pressure (!) 172/94, pulse 70, temperature 98 F (36.7 C), temperature source Oral, resp. rate 16, height 5\' 3"  (1.6 m), weight 121.3 kg (267 lb 8 oz), SpO2 100 %.  Weight trends: Filed Weights   07/29/16 1633 07/29/16 2124  Weight: 122.5 kg (270 lb) 121.3 kg (267 lb 8 oz)    Physical Exam: General: NAD,   Head: Normocephalic, atraumatic.  Eyes: Anicteric, EOMI  Nose: Mucous membranes moist, not inflammed, nonerythematous.  Throat: Oropharynx nonerythematous, no exudate appreciated.   Neck: Supple, trachea midline.  Lungs:  Normal respiratory effort. Clear to auscultation BL without crackles or wheezes.  Heart: RRR. S1 and S2 normal without gallop, murmur, or rubs.  Abdomen:  BS normoactive. Soft, Nondistended, non-tender.  No masses or organomegaly.  Extremities: No pretibial edema.  Neurologic: A&O X3, Motor strength is 5/5 in the all 4 extremities  Skin: No visible rashes, scars.    Lab results: Basic Metabolic Panel:  Recent  Labs Lab 07/29/16 1723 07/30/16 0135 07/31/16 0429  NA 137 139 138  K 2.9* 3.3* 4.4  CL 103 105 108  CO2 26 27 24   GLUCOSE 96 96 75  BUN 21* 21* 19  CREATININE 2.00* 2.13* 1.74*  CALCIUM 10.1 9.8 9.8  MG  --  1.7  --     Liver Function Tests:  Recent Labs Lab 07/29/16 1723  AST 78*  ALT 59*  ALKPHOS 59  BILITOT 0.4  PROT 7.8  ALBUMIN 3.1*   No results for input(s): LIPASE, AMYLASE in the last 168 hours. No results for input(s): AMMONIA in the last 168 hours.  CBC:  Recent Labs Lab 07/29/16 1723 07/30/16 0135 07/31/16 0429  WBC 7.9 7.2 6.9  NEUTROABS 5.2  --   --   HGB 9.5* 8.8* 8.6*  HCT 31.0* 29.1* 28.5*  MCV 67.6* 68.0* 69.4*  PLT 381 342 348    Cardiac Enzymes:  Recent Labs Lab 07/29/16 1723 07/29/16 1917 07/29/16 2254 07/30/16 0135  TROPONINI 2.54* 3.02* 3.87* 3.80*    BNP: Invalid input(s): POCBNP  CBG: No results for input(s): GLUCAP in the last 168 hours.  Microbiology: No results found for this or any previous visit.  Coagulation Studies:  Recent Labs  07/29/16 1819  LABPROT 12.0  INR 0.89    Urinalysis:  Recent Labs  07/29/16 1735  COLORURINE YELLOW*  LABSPEC 1.014  PHURINE 5.0  GLUCOSEU NEGATIVE  HGBUR SMALL*  BILIRUBINUR NEGATIVE  KETONESUR NEGATIVE  PROTEINUR 100*  NITRITE NEGATIVE  LEUKOCYTESUR NEGATIVE      Imaging: Dg Chest 2 View  Result Date: 07/29/2016 CLINICAL DATA:  Left-sided arm pain for 1 week. EXAM: CHEST  2 VIEW COMPARISON:  None. FINDINGS: There is no focal parenchymal opacity. There is no pleural effusion or pneumothorax. The heart and mediastinal contours are unremarkable. The osseous structures are unremarkable. IMPRESSION: No active cardiopulmonary  disease. Electronically Signed   By: Kathreen Devoid   On: 07/29/2016 17:28   Ct Head Wo Contrast  Result Date: 07/29/2016 CLINICAL DATA:  LEFT-sided numbness for 1 week EXAM: CT HEAD WITHOUT CONTRAST TECHNIQUE: Contiguous axial images were  obtained from the base of the skull through the vertex without intravenous contrast. COMPARISON:  None FINDINGS: Brain: Normal ventricular morphology. No midline shift or mass effect. Normal appearance of brain parenchyma. No intracranial hemorrhage, evidence of acute infarction, or extra-axial fluid collection. 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 Calcified extra-axial mass at the posterior parietal region on RIGHT 11 x 12 x 11 mm most consistent with meningioma. Vascular: Unremarkable Skull: Intact Sinuses/Orbits: Clear Other: N/A IMPRESSION: No acute intracranial abnormalities. 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 Electronically Signed   By: Lavonia Dana M.D.   On: 07/29/2016 17:32      Assessment & Plan: Pt is a 46 y.o. female with a PMHx of anemia and hypertension, who was admitted to [facility] on 07/29/2016 for evaluation of left arm numbness, palpitations, and shortness of breath.  1.  Acute renal failure. 2.  Chronic kidney disease stage 3 3.  Anemia of CKD. 4.  Hypertension. 5.  NSTEMI.  Plan:  The patient presented with an atypical presentation of non-ST elevation myocardial infarction.  She has been offered cardiac catheterization but has declined.  She has a first-degree relative who is on dialysis.  We will proceed with renal ultrasound, SPEP, UPEP, ANA, ANCA antibodies, GBM antibodies, C3, and C4.  No acute indication for dialysis.  Cardiac catheters addition could potentially worsen her renal function temporarily.  Her creatinine is now down to 1.7.  Recommend continued monitoring of renal function daily for now.  We will continue to monitor the patient's progress closely.

## 2016-08-01 ENCOUNTER — Encounter
Admission: EM | Disposition: A | Discharge status: Home / Self Care | Payer: Self-pay | Source: Home / Self Care | Attending: Internal Medicine

## 2016-08-01 ENCOUNTER — Inpatient Hospital Stay: Payer: Self-pay

## 2016-08-01 DIAGNOSIS — Z9889 Other specified postprocedural states: Secondary | ICD-10-CM

## 2016-08-01 HISTORY — DX: Other specified postprocedural states: Z98.890

## 2016-08-01 HISTORY — PX: LEFT HEART CATH AND CORONARY ANGIOGRAPHY: CATH118249

## 2016-08-01 LAB — BASIC METABOLIC PANEL
ANION GAP: 7 (ref 5–15)
ANION GAP: 7 (ref 5–15)
BUN: 15 mg/dL (ref 6–20)
BUN: 14 mg/dL (ref 6–20)
CALCIUM: 10.6 mg/dL — AB (ref 8.9–10.3)
CO2: 21 mmol/L — AB (ref 22–32)
CO2: 21 mmol/L — ABNORMAL LOW (ref 22–32)
CREATININE: 1.55 mg/dL — AB (ref 0.44–1.00)
CREATININE: 1.63 mg/dL — AB (ref 0.44–1.00)
Calcium: 10.4 mg/dL — ABNORMAL HIGH (ref 8.9–10.3)
Chloride: 108 mmol/L (ref 101–111)
Chloride: 106 mmol/L (ref 101–111)
GFR calc non Af Amer: 37 mL/min — ABNORMAL LOW (ref >60)
GFR, EST AFRICAN AMERICAN: 46 mL/min — AB (ref >60)
GFR, EST AFRICAN AMERICAN: 43 mL/min — AB (ref >60)
GFR, EST NON AFRICAN AMERICAN: 39 mL/min — AB (ref >60)
Glucose, Bld: 78 mg/dL (ref 65–99)
Glucose, Bld: 75 mg/dL (ref 65–99)
POTASSIUM: 4.1 mmol/L (ref 3.5–5.1)
Potassium: 4.3 mmol/L (ref 3.5–5.1)
SODIUM: 134 mmol/L — AB (ref 135–145)
Sodium: 136 mmol/L (ref 135–145)

## 2016-08-01 LAB — HEMOGLOBIN AND HEMATOCRIT, BLOOD
HCT: 31 % — ABNORMAL LOW (ref 35.0–47.0)
HEMOGLOBIN: 9.4 g/dL — AB (ref 12.0–16.0)

## 2016-08-01 LAB — TYPE AND SCREEN
ABO/RH(D): O POS
ANTIBODY SCREEN: NEGATIVE
UNIT DIVISION: 0

## 2016-08-01 LAB — BPAM RBC
Blood Product Expiration Date: 201804242359
ISSUE DATE / TIME: 201804021808
Unit Type and Rh: 5100

## 2016-08-01 LAB — CBC
HCT: 31.8 % — ABNORMAL LOW (ref 35.0–47.0)
HCT: 31.7 % — ABNORMAL LOW (ref 35.0–47.0)
HEMOGLOBIN: 9.9 g/dL — AB (ref 12.0–16.0)
Hemoglobin: 9.8 g/dL — ABNORMAL LOW (ref 12.0–16.0)
MCH: 21.1 pg — ABNORMAL LOW (ref 26.0–34.0)
MCH: 21.4 pg — ABNORMAL LOW (ref 26.0–34.0)
MCHC: 30.8 g/dL — ABNORMAL LOW (ref 32.0–36.0)
MCHC: 31.2 g/dL — AB (ref 32.0–36.0)
MCV: 68.4 fL — AB (ref 80.0–100.0)
MCV: 68.5 fL — ABNORMAL LOW (ref 80.0–100.0)
PLATELETS: 377 10*3/uL (ref 150–440)
Platelets: 354 10*3/uL (ref 150–440)
RBC: 4.65 MIL/uL (ref 3.80–5.20)
RBC: 4.62 MIL/uL (ref 3.80–5.20)
RDW: 21.3 % — AB (ref 11.5–14.5)
RDW: 21.8 % — ABNORMAL HIGH (ref 11.5–14.5)
WBC: 10.2 10*3/uL (ref 3.6–11.0)
WBC: 9.4 10*3/uL (ref 3.6–11.0)

## 2016-08-01 LAB — C3 COMPLEMENT: C3 Complement: 170 mg/dL — ABNORMAL HIGH (ref 82–167)

## 2016-08-01 LAB — PROTIME-INR
INR: 0.95
PROTHROMBIN TIME: 12.7 s (ref 11.4–15.2)

## 2016-08-01 LAB — C4 COMPLEMENT: Complement C4, Body Fluid: 63 mg/dL — ABNORMAL HIGH (ref 14–44)

## 2016-08-01 LAB — MPO/PR-3 (ANCA) ANTIBODIES
ANCA Proteinase 3: <3.5 U/mL (ref 0.0–3.5)
Myeloperoxidase Abs: <9 U/mL (ref 0.0–9.0)

## 2016-08-01 LAB — GLOMERULAR BASEMENT MEMBRANE ANTIBODIES: GBM Ab: 5 units (ref 0–20)

## 2016-08-01 LAB — ANA W/REFLEX IF POSITIVE: ANA: NEGATIVE

## 2016-08-01 LAB — HEPARIN LEVEL (UNFRACTIONATED): Heparin Unfractionated: 0.32 IU/mL (ref 0.30–0.70)

## 2016-08-01 SURGERY — LEFT HEART CATH AND CORONARY ANGIOGRAPHY
Anesthesia: Moderate Sedation | Laterality: Right

## 2016-08-01 MED ORDER — ASPIRIN 81 MG PO CHEW: 81.0000 mg | CHEWABLE_TABLET | ORAL | Status: DC

## 2016-08-01 MED ORDER — FENTANYL CITRATE (PF) 100 MCG/2ML IJ SOLN
INTRAMUSCULAR | Status: DC | PRN
  Administered 2016-08-01: 50 ug via INTRAVENOUS
  Administered 2016-08-01: 25 ug via INTRAVENOUS

## 2016-08-01 MED ORDER — SODIUM BICARBONATE 8.4 % IV SOLN: INTRAVENOUS | Status: DC

## 2016-08-01 MED ORDER — SODIUM CHLORIDE 0.9% FLUSH: 3.0000 mL | Freq: Two times a day (BID) | INTRAVENOUS | Status: DC

## 2016-08-01 MED ORDER — IOPAMIDOL (ISOVUE-300) INJECTION 61%
INTRAVENOUS | Status: DC | PRN
  Administered 2016-08-01: 40 mL via INTRA_ARTERIAL

## 2016-08-01 MED ORDER — ACETAMINOPHEN 325 MG PO TABS
650.0000 mg | ORAL_TABLET | ORAL | Status: DC | PRN
  Administered 2016-08-01: 650 mg via ORAL
  Filled 2016-08-01: qty 2

## 2016-08-01 MED ORDER — SODIUM CHLORIDE 0.9% FLUSH: 3.0000 mL | INTRAVENOUS | Status: DC | PRN

## 2016-08-01 MED ORDER — SODIUM CHLORIDE 0.9% FLUSH
3.0000 mL | Freq: Two times a day (BID) | INTRAVENOUS | Status: DC
  Administered 2016-08-02: 3 mL via INTRAVENOUS

## 2016-08-01 MED ORDER — HYDRALAZINE HCL 20 MG/ML IJ SOLN
INTRAMUSCULAR | Status: AC
  Administered 2016-08-01: 14:00:00
  Filled 2016-08-01: qty 1

## 2016-08-01 MED ORDER — DEXTROSE 5 % IV SOLN
INTRAVENOUS | Status: DC
  Filled 2016-08-01: qty 500

## 2016-08-01 MED ORDER — LIDOCAINE HCL (PF) 1 % IJ SOLN
INTRAMUSCULAR | Status: DC | PRN
Start: 2016-08-01 — End: 2016-08-01
  Administered 2016-08-01: 20 mL via SUBCUTANEOUS

## 2016-08-01 MED ORDER — SODIUM CHLORIDE 0.9 % WEIGHT BASED INFUSION: 3.0000 mL/kg/h | INTRAVENOUS | Status: DC

## 2016-08-01 MED ORDER — HYDRALAZINE HCL 20 MG/ML IJ SOLN
10.0000 mg | Freq: Once | INTRAMUSCULAR | Status: AC
  Administered 2016-08-01: 10 mg via INTRAVENOUS

## 2016-08-01 MED ORDER — MIDAZOLAM HCL 2 MG/2ML IJ SOLN
INTRAMUSCULAR | Status: AC
  Filled 2016-08-01: qty 2

## 2016-08-01 MED ORDER — ASPIRIN 325 MG PO TABS
325.0000 mg | ORAL_TABLET | Freq: Every day | ORAL | Status: DC
  Administered 2016-08-02: 325 mg via ORAL
  Filled 2016-08-01: qty 1

## 2016-08-01 MED ORDER — SODIUM BICARBONATE BOLUS VIA INFUSION: INTRAVENOUS | Status: DC

## 2016-08-01 MED ORDER — MIDAZOLAM HCL 2 MG/2ML IJ SOLN
INTRAMUSCULAR | Status: DC | PRN
  Administered 2016-08-01 (×2): 1 mg via INTRAVENOUS

## 2016-08-01 MED ORDER — ISOSORBIDE MONONITRATE ER 30 MG PO TB24
30.0000 mg | ORAL_TABLET | Freq: Every day | ORAL | Status: DC
  Administered 2016-08-01 – 2016-08-02 (×2): 30 mg via ORAL
  Filled 2016-08-01 (×2): qty 1

## 2016-08-01 MED ORDER — FENTANYL CITRATE (PF) 100 MCG/2ML IJ SOLN
INTRAMUSCULAR | Status: AC
  Filled 2016-08-01: qty 2

## 2016-08-01 MED ORDER — SODIUM CHLORIDE 0.9 % WEIGHT BASED INFUSION: 1.0000 mL/kg/h | INTRAVENOUS | Status: DC

## 2016-08-01 MED ORDER — SODIUM CHLORIDE 0.9 % IV SOLN: 250.0000 mL | INTRAVENOUS | Status: DC | PRN

## 2016-08-01 MED ORDER — SODIUM BICARBONATE BOLUS VIA INFUSION
INTRAVENOUS | Status: AC
  Administered 2016-08-01: 330 meq via INTRAVENOUS
  Filled 2016-08-01: qty 1

## 2016-08-01 MED ORDER — DEXTROSE 5 % IV SOLN
INTRAVENOUS | Status: DC
  Filled 2016-08-01: qty 1000

## 2016-08-01 MED ORDER — ASPIRIN 81 MG PO CHEW
81.0000 mg | CHEWABLE_TABLET | ORAL | Status: AC
  Administered 2016-08-01: 81 mg via ORAL
  Filled 2016-08-01: qty 1

## 2016-08-01 MED ORDER — LIDOCAINE HCL (PF) 1 % IJ SOLN
INTRAMUSCULAR | Status: AC
  Filled 2016-08-01: qty 30

## 2016-08-01 MED ORDER — SODIUM CHLORIDE 0.9 % WEIGHT BASED INFUSION
1.0000 mL/kg/h | INTRAVENOUS | Status: AC
  Administered 2016-08-01: 1 mL/kg/h via INTRAVENOUS

## 2016-08-01 MED ORDER — ONDANSETRON HCL 4 MG/2ML IJ SOLN
4.0000 mg | Freq: Four times a day (QID) | INTRAMUSCULAR | Status: DC | PRN
  Administered 2016-08-01: 4 mg via INTRAVENOUS
  Filled 2016-08-01: qty 2

## 2016-08-01 SURGICAL SUPPLY — 10 items
CATH 5FR JL4 DIAGNOSTIC (CATHETERS) ×1 IMPLANT
CATH INFINITI 5FR ANG PIGTAIL (CATHETERS) ×1 IMPLANT
CATH INFINITI JR4 5F (CATHETERS) ×1 IMPLANT
DEVICE CLOSURE MYNXGRIP 5F (Vascular Products) ×1 IMPLANT
KIT MANI 3VAL PERCEP (MISCELLANEOUS) ×2 IMPLANT
NDL PERC 18GX7CM (NEEDLE) IMPLANT
NEEDLE PERC 18GX7CM (NEEDLE) ×2 IMPLANT
PACK CARDIAC CATH (CUSTOM PROCEDURE TRAY) ×2 IMPLANT
SHEATH AVANTI 5FR X 11CM (SHEATH) ×1 IMPLANT
WIRE EMERALD 3MM-J .035X150CM (WIRE) ×1 IMPLANT

## 2016-08-01 NOTE — Progress Notes (Signed)
SUBJECTIVE: Patient had SOB and yesterday cp   Vitals:   07/31/16 2142 07/31/16 2211 08/01/16 0058 08/01/16 0450  BP: (!) 193/100 (!) 194/106 (!) 184/96 (!) 186/107  Pulse: 87 89 85 89  Resp: 16   19  Temp: 98.4 F (36.9 C) 98.2 F (36.8 C)  98.2 F (36.8 C)  TempSrc: Oral Oral  Oral  SpO2: 100% 99% 95% 93%  Weight:      Height:        Intake/Output Summary (Last 24 hours) at 08/01/16 0830 Last data filed at 08/01/16 0209  Gross per 24 hour  Intake              800 ml  Output              900 ml  Net             -100 ml    LABS: Basic Metabolic Panel:  Recent Labs  07/30/16 0135 07/31/16 0429 08/01/16 0420  NA 139 138 136  K 3.3* 4.4 4.3  CL 105 108 108  CO2 27 24 21*  GLUCOSE 96 75 78  BUN 21* 19 15  CREATININE 2.13* 1.74* 1.55*  CALCIUM 9.8 9.8 10.6*  MG 1.7  --   --    Liver Function Tests:  Recent Labs  07/29/16 1723  AST 78*  ALT 59*  ALKPHOS 59  BILITOT 0.4  PROT 7.8  ALBUMIN 3.1*   No results for input(s): LIPASE, AMYLASE in the last 72 hours. CBC:  Recent Labs  07/29/16 1723  07/31/16 0429 08/01/16 0018 08/01/16 0420  WBC 7.9  < > 6.9  --  10.2  NEUTROABS 5.2  --   --   --   --   HGB 9.5*  < > 8.6* 9.4* 9.8*  HCT 31.0*  < > 28.5* 31.0* 31.8*  MCV 67.6*  < > 69.4*  --  68.4*  PLT 381  < > 348  --  377  < > = values in this interval not displayed. Cardiac Enzymes:  Recent Labs  07/29/16 1917 07/29/16 2254 07/30/16 0135  TROPONINI 3.02* 3.87* 3.80*   BNP: Invalid input(s): POCBNP D-Dimer: No results for input(s): DDIMER in the last 72 hours. Hemoglobin A1C:  Recent Labs  07/29/16 1753  HGBA1C 5.3   Fasting Lipid Panel:  Recent Labs  07/29/16 1753  CHOL 190  HDL 52  LDLCALC 109*  TRIG 143  CHOLHDL 3.7   Thyroid Function Tests: No results for input(s): TSH, T4TOTAL, T3FREE, THYROIDAB in the last 72 hours.  Invalid input(s): FREET3 Anemia Panel: No results for input(s): VITAMINB12, FOLATE, FERRITIN, TIBC,  IRON, RETICCTPCT in the last 72 hours.   PHYSICAL EXAM General: Well developed, well nourished, in no acute distress HEENT:  Normocephalic and atramatic Neck:  No JVD.  Lungs: Clear bilaterally to auscultation and percussion. Heart: HRRR . Normal S1 and S2 without gallops or murmurs.  Abdomen: Bowel sounds are positive, abdomen soft and non-tender  Msk:  Back normal, normal gait. Normal strength and tone for age. Extremities: No clubbing, cyanosis or edema.   Neuro: Alert and oriented X 3. Psych:  Good affect, responds appropriately  TELEMETRY: NSR  ASSESSMENT AND PLAN: Will do cath today as creatinine much better and had SOB and cp.  Active Problems:   NSTEMI (non-ST elevated myocardial infarction) ([facility])   Iron deficiency anemia due to chronic blood loss    Lauren Byron A, MD, [facility] 08/01/2016 8:30 AM

## 2016-08-01 NOTE — Progress Notes (Signed)
Patient had cardiac catheterization without complication and only received 40 cc of dye with left ventricle ejection fraction normal on echocardiogram LV gram was deferred. Left ventricular ejection fraction on echo was 60%. Since patient only received 40 cc of dye and has renal insufficiency advise continuing IV fluid overnight. May be discharged Wednesday if it's okay with renal and GI. Follow-up in the office Friday at 10:00.

## 2016-08-01 NOTE — Progress Notes (Signed)
Transferred to Cath lab.  Handoff given to [facility]

## 2016-08-01 NOTE — Progress Notes (Signed)
[facility] [place] NAME: Sharea Guinther    MR#:  [id number]  DATE OF BIRTH:  [birth year]  SUBJECTIVE:  CHIEF COMPLAINT:   Chief Complaint  Patient presents with  . Numbness   s/p cardiac catheter today. REVIEW OF SYSTEMS:  Review of Systems  Constitutional: Negative for chills, fever and malaise/fatigue.  HENT: Negative for congestion.   Eyes: Negative for blurred vision and double vision.  Respiratory: Negative for cough, shortness of breath and stridor.   Cardiovascular: Negative for chest pain, palpitations and leg swelling.  Gastrointestinal: Negative for abdominal pain, blood in stool, diarrhea, heartburn, melena, nausea and vomiting.  Genitourinary: Negative for dysuria and hematuria.  Musculoskeletal: Negative for back pain.  Skin: Negative for itching and rash.  Neurological: Negative for dizziness, focal weakness, loss of consciousness, weakness and headaches.  Psychiatric/Behavioral: Negative for depression. The patient is not nervous/anxious.     DRUG ALLERGIES:   Allergies  Allergen Reactions  . Ace Inhibitors Cough  . Latex Swelling   VITALS:  Blood pressure (!) 169/88, pulse 88, temperature 97.6 F (36.4 C), temperature source Oral, resp. rate 18, height 5\' 3"  (1.6 m), weight 267 lb (121.1 kg), SpO2 92 %. PHYSICAL EXAMINATION:  Physical Exam  Constitutional:  Morbid obesity.  HENT:  Head: Normocephalic.  Mouth/Throat: Oropharynx is clear and moist.  Eyes: Conjunctivae and EOM are normal.  Neck: Normal range of motion. Neck supple. No JVD present. No tracheal deviation present.  Cardiovascular: Normal rate, regular rhythm and normal heart sounds.  Exam reveals no gallop.   No murmur heard. Pulmonary/Chest: Effort normal and breath sounds normal. No respiratory distress. She has no wheezes. She has no rales.  Abdominal: Soft. Bowel sounds are normal. She exhibits no distension. There is no tenderness.    Musculoskeletal: Normal range of motion. She exhibits no edema or tenderness.  Neurological: She is alert. No cranial nerve deficit. Gait normal.  Skin: No rash noted. No erythema.  Psychiatric: Affect and judgment normal.   LABORATORY PANEL:  Female CBC  Recent Labs Lab 08/01/16 1001  WBC 9.4  HGB 9.9*  HCT 31.7*  PLT 354   ------------------------------------------------------------------------------------------------------------------ Chemistries   Recent Labs Lab 07/29/16 1723 07/30/16 0135  08/01/16 1001  NA 137 139  < > 134*  K 2.9* 3.3*  < > 4.1  CL 103 105  < > 106  CO2 26 27  < > 21*  GLUCOSE 96 96  < > 75  BUN 21* 21*  < > 14  CREATININE 2.00* 2.13*  < > 1.63*  CALCIUM 10.1 9.8  < > 10.4*  MG  --  1.7  --   --   AST 78*  --   --   --   ALT 59*  --   --   --   ALKPHOS 59  --   --   --   BILITOT 0.4  --   --   --   < > = values in this interval not displayed. RADIOLOGY:  US Renal  Result Date: 08/01/2016 CLINICAL DATA:  Acute renal failure EXAM: RENAL / URINARY TRACT ULTRASOUND COMPLETE COMPARISON:  06/14/2016 abdominal CT FINDINGS: Right Kidney: Length: 11 cm. Mildly echogenic cortex diffusely. 2 renal cysts with simple appearance, 2 cm in the upper kidney and up to 5.6 cm in the lower pole. No hydronephrosis. Left Kidney: Length: 11.5 cm. Mildly echogenic. 4 cm cyst in the lower pole with benign-appearing septation. No  hydronephrosis. Bladder: Limited visualization without detected pathology. IMPRESSION: 1. No hydronephrosis. 2. Mildly echogenic renal parenchyma as seen with nonspecific medical renal disease. Normal renal size. 3. Bilateral renal cysts measuring up to 5.6 cm on the right. Electronically Signed   By: Monte Fantasia M.D.   On: 08/01/2016 09:51   ASSESSMENT AND PLAN:   * Non-ST elevation MI She has been on IV heparin drip. Troponin is up to 3.87. Continue aspirin, metoprolol and lipitor. Per Dr. Humphrey Rolls, She would also need antiplatelet including  Plavix and aspirin which she will have difficulty tolerating. She initially refused to go through cardiac catheterization. Follow-up echo: Normal LVEF and wall motion with moderate MR. Hemoglobin A1c 5.3. Cardiac catheter today showed normal coronaries with normal left reticular systolic function.  * HLP. LDL 109,  lipitor.  * Hypertension, accelerated   Continue metoprolol, discontinued losartan, started hydralazine and Imdur.  * Anemia of chronic disease- iron deficiency. Hb is stable at 8.6.   Continue oral iron supplements. Stool occult.  Consider transfusing until Hb of 10 per Dr. Humphrey Rolls. Per Dr. Allen Norris, agree transfusing the patient at this time would be warranted.   * Acute on chronic renal failure CKD stage III   improving with IV fluid and follow-up BMP in am.  Hypokalemia. Improved with potassium supplement.  I discussed with Dr. Holley Raring. All the records are reviewed and case discussed with Care Management/Social Worker. Management plans discussed with the patient, family and they are in agreement.  CODE STATUS: Full Code  TOTAL TIME TAKING CARE OF THIS PATIENT: 33 minutes.   More than 50% of the time was spent in counseling/coordination of care: YES  POSSIBLE D/C IN 1-2 DAYS, DEPENDING ON CLINICAL CONDITION.   Demetrios Loll M.D on 08/01/2016 at 2:38 PM  Between 7am to 6pm - Pager - [phone]  After 6pm go to [email or web address] - password [email or web address]  Office  [phone]  CC: Primary care physician; [facility]  Note: This dictation was prepared with Dragon dictation along with smaller phrase technology. Any transcriptional errors that result from this process are unintentional.

## 2016-08-01 NOTE — Progress Notes (Signed)
[facility]  ROUNDING NOTE   Subjective:  Patient having cardiac catheterization today. She remains on IV fluid hydration. Serum creatinine down to 1.5.   Objective:  Vital signs in last 24 hours:  Temp:  [98 F (36.7 C)-99 F (37.2 C)] 99 F (37.2 C) (04/03 1051) Pulse Rate:  [67-105] 105 (04/03 1058) Resp:  [16-20] 20 (04/03 1058) BP: (162-194)/(87-111) 194/111 (04/03 1058) SpO2:  [93 %-100 %] 96 % (04/03 1214) Weight:  [121.1 kg (267 lb)] 121.1 kg (267 lb) (04/03 1051)  Weight change:  Filed Weights   07/29/16 1633 07/29/16 2124 08/01/16 1051  Weight: 122.5 kg (270 lb) 121.3 kg (267 lb 8 oz) 121.1 kg (267 lb)    Intake/Output: I/O last 3 completed shifts: In: 800 [P.O.:480; Blood:320] Out: 2300 [Urine:2300]   Intake/Output this shift:  No intake/output data recorded.  Physical Exam: General: No acute distress  Head: Normocephalic, atraumatic. Moist oral mucosal membranes  Eyes: Anicteric  Neck: Supple, trachea midline  Lungs:  Clear to auscultation, normal effort  Heart: S1S2 no rubs  Abdomen:  Soft, nontender,   Extremities: Trace peripheral edema.  Neurologic: Nonfocal, moving all four extremities  Skin: No lesions       Basic Metabolic Panel:  Recent Labs Lab 07/29/16 1723 07/30/16 0135 07/31/16 0429 08/01/16 0420 08/01/16 1001  NA 137 139 138 136 134*  K 2.9* 3.3* 4.4 4.3 4.1  CL 103 105 108 108 106  CO2 26 27 24  21* 21*  GLUCOSE 96 96 75 78 75  BUN 21* 21* 19 15 14   CREATININE 2.00* 2.13* 1.74* 1.55* 1.63*  CALCIUM 10.1 9.8 9.8 10.6* 10.4*  MG  --  1.7  --   --   --     Liver Function Tests:  Recent Labs Lab 07/29/16 1723  AST 78*  ALT 59*  ALKPHOS 59  BILITOT 0.4  PROT 7.8  ALBUMIN 3.1*   No results for input(s): LIPASE, AMYLASE in the last 168 hours. No results for input(s): AMMONIA in the last 168 hours.  CBC:  Recent Labs Lab 07/29/16 1723 07/30/16 0135 07/31/16 0429 08/01/16 0018 08/01/16 0420  08/01/16 1001  WBC 7.9 7.2 6.9  --  10.2 9.4  NEUTROABS 5.2  --   --   --   --   --   HGB 9.5* 8.8* 8.6* 9.4* 9.8* 9.9*  HCT 31.0* 29.1* 28.5* 31.0* 31.8* 31.7*  MCV 67.6* 68.0* 69.4*  --  68.4* 68.5*  PLT 381 342 348  --  377 354    Cardiac Enzymes:  Recent Labs Lab 07/29/16 1723 07/29/16 1917 07/29/16 2254 07/30/16 0135  TROPONINI 2.54* 3.02* 3.87* 3.80*    BNP: Invalid input(s): POCBNP  CBG: No results for input(s): GLUCAP in the last 168 hours.  Microbiology: No results found for this or any previous visit.  Coagulation Studies:  Recent Labs  07/29/16 1819 08/01/16 1001  LABPROT 12.0 12.7  INR 0.89 0.95    Urinalysis:  Recent Labs  07/29/16 1735  COLORURINE YELLOW*  LABSPEC 1.014  PHURINE 5.0  GLUCOSEU NEGATIVE  HGBUR SMALL*  BILIRUBINUR NEGATIVE  KETONESUR NEGATIVE  PROTEINUR 100*  NITRITE NEGATIVE  LEUKOCYTESUR NEGATIVE      Imaging: US Renal  Result Date: 08/01/2016 CLINICAL DATA:  Acute renal failure EXAM: RENAL / URINARY TRACT ULTRASOUND COMPLETE COMPARISON:  06/14/2016 abdominal CT FINDINGS: Right Kidney: Length: 11 cm. Mildly echogenic cortex diffusely. 2 renal cysts with simple appearance, 2 cm in the upper kidney and  up to 5.6 cm in the lower pole. No hydronephrosis. Left Kidney: Length: 11.5 cm. Mildly echogenic. 4 cm cyst in the lower pole with benign-appearing septation. No hydronephrosis. Bladder: Limited visualization without detected pathology. IMPRESSION: 1. No hydronephrosis. 2. Mildly echogenic renal parenchyma as seen with nonspecific medical renal disease. Normal renal size. 3. Bilateral renal cysts measuring up to 5.6 cm on the right. Electronically Signed   By: Monte Fantasia M.D.   On: 08/01/2016 09:51     Medications:   . heparin 1,650 Units/hr (08/01/16 1030)   . [MAR Hold] aspirin EC  325 mg Oral Daily  . [MAR Hold] atorvastatin  40 mg Oral q1800  . [MAR Hold] calcium carbonate  1 tablet Oral TID WC  . [MAR Hold]  docusate sodium  100 mg Oral BID  . [MAR Hold] ferrous sulfate  325 mg Oral TID WC  . [MAR Hold] hydrALAZINE  100 mg Oral Q8H  . [MAR Hold] isosorbide mononitrate  60 mg Oral Daily  . [MAR Hold] metoprolol tartrate  100 mg Oral BID  . [MAR Hold] potassium chloride  40 mEq Oral BID  . [MAR Hold] ranolazine  500 mg Oral BID  . sodium bicarbonate (CIN) infusion   Intravenous Pre-Cath   And  . sodium bicarbonate (CIN) infusion   Intravenous Pre-Cath  . [MAR Hold] sodium chloride flush  3 mL Intravenous Q12H  . sodium chloride flush  3 mL Intravenous Q12H   sodium chloride, [MAR Hold] docusate sodium, fentaNYL, [MAR Hold] labetalol, midazolam, [MAR Hold] nitroGLYCERIN, [MAR Hold] ondansetron, sodium chloride flush  Assessment/ Plan:  46 y.o. female with a PMHx of anemia and hypertension, who was admitted to [facility] on 07/29/2016 for evaluation of left arm numbness, palpitations, and shortness of breath.  1.  Acute renal failure. 2.  Chronic kidney disease stage 3 3.  Anemia of CKD. 4.  Hypertension. 5.  NSTEMI.  Plan:  Patient due for cardiac catheterization today. Prior to cardiac catheterization she was on 0.9 normal saline. She is receiving sodium bicarbonate during the cardiac catheterization. Recommend continued monitoring of renal function postcardiac catheterization. Renal ultrasound demonstrated mildly echogenic kidneys which is likely secondary to chronic kidney disease stage III. Further plan as patient progresses.   LOS: 3 Amelya Mabry 4/3/201812:33 PM

## 2016-08-01 NOTE — Progress Notes (Signed)
[place] for heparin drip Indication: chest pain/ACS      Allergies  Allergen Reactions  . Ace Inhibitors Cough  . Latex Swelling    Patient Measurements: Height: 5\' 3"  (160 cm) Weight: 270 lb (122.5 kg) IBW/kg (Calculated) : 52.4 Heparin Dosing Weight: 83 kg  Vital Signs: Temp: 98.4 F (36.9 C) (03/31 1632) Temp Source: Oral (03/31 1632) BP: 165/88 (03/31 1738) Pulse Rate: 96 (03/31 1738)  Labs:  HL= 0.14  CBC Latest Ref Rng & Units 07/30/2016 07/29/2016 06/14/2016  WBC 3.6 - 11.0 K/uL 7.2 7.9 8.6  Hemoglobin 12.0 - 16.0 g/dL 8.8(L) 9.5(L) 9.6(L)  Hematocrit 35.0 - 47.0 % 29.1(L) 31.0(L) 32.3(L)  Platelets 150 - 440 K/uL 342 381 423   Medical History:     Past Medical History:  Diagnosis Date  . Anemia   . Hypertension    Assessment: 46 yo female with chest pain/ACS. Pharmacy consulted for heparin dosing and monitoring.   Currently on Heparin 1650 units/hr 4/2 10:30 Heparin level resulted at 0.37  Goal of Therapy: Heparin level 0.3-0.7 units/ml Monitor platelets by anticoagulation protocol: Yes  Plan: Will continue with current rate of 1650 units/hr. Patient has had 2 consecutive therapeutic Heparin levels, next Heparin level and CBC with AM labs.  4/3 AM heparin level 0.32. Continue current regimen. Recheck CBC and heparin level with tomorrow AM labs.  Paulina Fusi, PharmD, BCPS 08/01/2016 5:51 AM

## 2016-08-01 NOTE — Progress Notes (Signed)
Pt remains clinically stable post heart cath,no intervention. Right groin without bleeding nor hematoma. Pt hypertensive pre and post procedure, called Dr Humphrey Rolls with orders given and hydralazine 10mg  iv. Sinus per monitor. Report called to Amy RN on telemetry with plan reviewed. Pt denies complaints at this time.

## 2016-08-02 ENCOUNTER — Encounter: Payer: Self-pay | Admitting: Cardiovascular Disease

## 2016-08-02 LAB — CBC
HCT: 32.7 % — ABNORMAL LOW (ref 35.0–47.0)
HEMOGLOBIN: 10.3 g/dL — AB (ref 12.0–16.0)
MCH: 21.3 pg — AB (ref 26.0–34.0)
MCHC: 31.4 g/dL — AB (ref 32.0–36.0)
MCV: 67.9 fL — ABNORMAL LOW (ref 80.0–100.0)
PLATELETS: 370 10*3/uL (ref 150–440)
RBC: 4.81 MIL/uL (ref 3.80–5.20)
RDW: 21.8 % — ABNORMAL HIGH (ref 11.5–14.5)
WBC: 10.2 10*3/uL (ref 3.6–11.0)

## 2016-08-02 LAB — PROTEIN ELECTRO, RANDOM URINE
ALBUMIN ELP UR: 52.3 %
Alpha-1-Globulin, U: 10.4 %
Alpha-2-Globulin, U: 8.8 %
BETA GLOBULIN, U: 15.5 %
Gamma Globulin, U: 12.9 %
TOTAL PROTEIN, URINE-UPE24: 115 mg/dL

## 2016-08-02 LAB — BASIC METABOLIC PANEL
Anion gap: 8 (ref 5–15)
BUN: 13 mg/dL (ref 6–20)
CHLORIDE: 105 mmol/L (ref 101–111)
CO2: 21 mmol/L — ABNORMAL LOW (ref 22–32)
CREATININE: 1.58 mg/dL — AB (ref 0.44–1.00)
Calcium: 10.6 mg/dL — ABNORMAL HIGH (ref 8.9–10.3)
GFR calc Af Amer: 45 mL/min — ABNORMAL LOW (ref >60)
GFR calc non Af Amer: 39 mL/min — ABNORMAL LOW (ref >60)
Glucose, Bld: 82 mg/dL (ref 65–99)
Potassium: 4.3 mmol/L (ref 3.5–5.1)
SODIUM: 134 mmol/L — AB (ref 135–145)

## 2016-08-02 MED ORDER — SODIUM CHLORIDE 0.9 % IV SOLN
INTRAVENOUS | Status: DC
  Administered 2016-08-02: 10:00:00 via INTRAVENOUS

## 2016-08-02 MED ORDER — ISOSORBIDE MONONITRATE ER 30 MG PO TB24: 30.0000 mg | ORAL_TABLET | Freq: Every day | ORAL | 2 refills | Status: DC

## 2016-08-02 MED ORDER — METOPROLOL TARTRATE 50 MG PO TABS
100.0000 mg | ORAL_TABLET | Freq: Two times a day (BID) | ORAL | Status: DC
  Administered 2016-08-02: 100 mg via ORAL
  Filled 2016-08-02: qty 2

## 2016-08-02 MED ORDER — METOPROLOL TARTRATE 50 MG PO TABS: 50.0000 mg | ORAL_TABLET | Freq: Two times a day (BID) | ORAL | Status: DC

## 2016-08-02 MED ORDER — ASPIRIN 325 MG PO TABS: 325.0000 mg | ORAL_TABLET | Freq: Every day | ORAL | 2 refills | Status: DC

## 2016-08-02 MED ORDER — ATORVASTATIN CALCIUM 40 MG PO TABS: 40.0000 mg | ORAL_TABLET | Freq: Every day | ORAL | 2 refills | Status: AC

## 2016-08-02 MED ORDER — HYDRALAZINE HCL 100 MG PO TABS: 100.0000 mg | ORAL_TABLET | Freq: Three times a day (TID) | ORAL | 2 refills | Status: DC

## 2016-08-02 MED ORDER — METOPROLOL TARTRATE 100 MG PO TABS: 100.0000 mg | ORAL_TABLET | Freq: Two times a day (BID) | ORAL | 2 refills | Status: DC

## 2016-08-02 NOTE — Discharge Summary (Signed)
[place] at [place] NAME: [name]    MR#:  [id number]  DATE OF BIRTH:  February 22, 1971  DATE OF ADMISSION:  07/29/2016   ADMITTING PHYSICIAN: Vaughan Basta, MD  DATE OF DISCHARGE: 08/02/2016 11:40 AM  PRIMARY CARE PHYSICIAN: [place]   ADMISSION DIAGNOSIS:  NSTEMI (non-ST elevated myocardial infarction) ([place]) [I21.4] DISCHARGE DIAGNOSIS:  Active Problems:   NSTEMI (non-ST elevated myocardial infarction) (HCC)   Iron deficiency anemia due to chronic blood loss  Acute on chronic renal failure CKDstage III SECONDARY DIAGNOSIS:   Past Medical History:  Diagnosis Date  . Anemia   . Hypertension    HOSPITAL COURSE:   * Non-ST elevation MI She has been on IV heparin drip. Troponin is up to 3.87. Continue aspirin, metoprolol and lipitor. Per Dr. Humphrey Rolls, She would also need antiplatelet including Plavix and aspirin which she will have difficulty tolerating. She initially refused to go through cardiac catheterization. Follow-up echo: Normal LVEF and wall motion with moderate MR. Hemoglobin A1c 5.3. Cardiac catheter showed normal coronaries with normal left reticular systolic function.  * HLP. LDL 109,  lipitor.  * Hypertension, accelerated Continue metoprolol, discontinued losartan, started hydralazine and Imdur.  * Anemia of chronic disease- iron deficiency. Continue oral iron supplements. Stool occult.  Consider transfusing until Hb of 10 per Dr. Humphrey Rolls. Per Dr. Allen Norris, agree transfusing the patient at this time would be warranted. The patient got 1 unit of PRBC transfusion. Hb is stable at 10.3  * Acute on chronic renal failure CKDstage III  improving with IV fluid and follow-up BMP as outpatient.  Hypokalemia. Improved with potassium supplement. Discussed with Dr. Holley Raring. DISCHARGE CONDITIONS:  Stable, discharge to home today. CONSULTS OBTAINED:  Treatment Team:  Dionisio David,  MD Lavonia Dana, MD Lucilla Lame, MD DRUG ALLERGIES:   Allergies  Allergen Reactions  . Ace Inhibitors Cough  . Latex Swelling   DISCHARGE MEDICATIONS:   Allergies as of 08/02/2016      Reactions   Ace Inhibitors Cough   Latex Swelling      Medication List    STOP taking these medications   cloNIDine 0.2 MG tablet Commonly known as:  CATAPRES   losartan-hydrochlorothiazide 100-25 MG tablet Commonly known as:  HYZAAR     TAKE these medications   aspirin 325 MG tablet Take 1 tablet (325 mg total) by mouth daily. Start taking on:  08/03/2016   atorvastatin 40 MG tablet Commonly known as:  LIPITOR Take 1 tablet (40 mg total) by mouth daily at 6 PM.   docusate sodium 100 MG capsule Commonly known as:  COLACE Take 1 capsule (100 mg total) by mouth 2 (two) times daily.   ferrous sulfate 325 (65 FE) MG tablet Take 325 mg by mouth 3 (three) times daily with meals. What changed:  Another medication with the same name was removed. Continue taking this medication, and follow the directions you see here.   hydrALAZINE 100 MG tablet Commonly known as:  APRESOLINE Take 1 tablet (100 mg total) by mouth every 8 (eight) hours.   isosorbide mononitrate 30 MG 24 hr tablet Commonly known as:  IMDUR Take 1 tablet (30 mg total) by mouth daily. Start taking on:  08/03/2016   metoprolol 100 MG tablet Commonly known as:  LOPRESSOR Take 1 tablet (100 mg total) by mouth 2 (two) times daily. What changed:  medication strength  how much to take  Another medication with the same  name was removed. Continue taking this medication, and follow the directions you see here.   norethindrone-ethinyl estradiol 1/35 tablet Commonly known as:  ORTHO-NOVUM 1/35 (28) Take 1 tablet by mouth daily. 1 po tid x 3 days then 1 po bid x 2 days then qd . Dispose of the last 7 tabs and restart new pack   ondansetron 4 MG tablet Commonly known as:  ZOFRAN Take 1 tablet (4 mg total) by mouth every 6 (six)  hours as needed for nausea.        DISCHARGE INSTRUCTIONS:  See AVS  If you experience worsening of your admission symptoms, develop shortness of breath, life threatening emergency, suicidal or homicidal thoughts you must seek medical attention immediately by calling 911 or calling your MD immediately  if symptoms less severe.  You Must read complete instructions/literature along with all the possible adverse reactions/side effects for all the Medicines you take and that have been prescribed to you. Take any new Medicines after you have completely understood and accpet all the possible adverse reactions/side effects.   Please note  You were cared for by a hospitalist during your hospital stay. If you have any questions about your discharge medications or the care you received while you were in the hospital after you are discharged, you can call the unit and asked to speak with the hospitalist on call if the hospitalist that took care of you is not available. Once you are discharged, your primary care physician will handle any further medical issues. Please note that NO REFILLS for any discharge medications will be authorized once you are discharged, as it is imperative that you return to your primary care physician (or establish a relationship with a primary care physician if you do not have one) for your aftercare needs so that they can reassess your need for medications and monitor your lab values.    On the day of Discharge:  VITAL SIGNS:  Blood pressure (!) 178/79, pulse (!) 111, temperature 98.3 F (36.8 C), temperature source Oral, resp. rate 18, height 5\' 3"  (1.6 m), weight 267 lb (121.1 kg), SpO2 98 %. PHYSICAL EXAMINATION:  GENERAL:  46 y.o.-year-old patient lying in the bed with no acute distress. Morbidly obese. EYES: Pupils equal, round, reactive to light and accommodation. No scleral icterus. Extraocular muscles intact.  HEENT: Head atraumatic, normocephalic. Oropharynx and  nasopharynx clear.  NECK:  Supple, no jugular venous distention. No thyroid enlargement, no tenderness.  LUNGS: Normal breath sounds bilaterally, no wheezing, rales,rhonchi or crepitation. No use of accessory muscles of respiration.  CARDIOVASCULAR: S1, S2 normal. No murmurs, rubs, or gallops.  ABDOMEN: Soft, non-tender, non-distended. Bowel sounds present. No organomegaly or mass.  EXTREMITIES: No pedal edema, cyanosis, or clubbing.  NEUROLOGIC: Cranial nerves II through XII are intact. Muscle strength 5/5 in all extremities. Sensation intact. Gait not checked.  PSYCHIATRIC: The patient is alert and oriented x 3.  SKIN: No obvious rash, lesion, or ulcer.  DATA REVIEW:   CBC  Recent Labs Lab 08/02/16 0807  WBC 10.2  HGB 10.3*  HCT 32.7*  PLT 370    Chemistries   Recent Labs Lab 07/29/16 1723 07/30/16 0135  08/02/16 0807  NA 137 139  < > 134*  K 2.9* 3.3*  < > 4.3  CL 103 105  < > 105  CO2 26 27  < > 21*  GLUCOSE 96 96  < > 82  BUN 21* 21*  < > 13  CREATININE 2.00* 2.13*  < >  1.58*  CALCIUM 10.1 9.8  < > 10.6*  MG  --  1.7  --   --   AST 78*  --   --   --   ALT 59*  --   --   --   ALKPHOS 59  --   --   --   BILITOT 0.4  --   --   --   < > = values in this interval not displayed.   Microbiology Results  No results found for this or any previous visit.  RADIOLOGY:  No results found.   Management plans discussed with the patient, family and they are in agreement.  CODE STATUS: Prior   TOTAL TIME TAKING CARE OF THIS PATIENT: 35 minutes.    Demetrios Loll M.D on 08/02/2016 at 5:44 PM  Between 7am to 6pm - Pager - [phone]  After 6pm go to [email or web address] - Technical brewer [place] [facility]  Office  [phone]  CC: Primary care physician; [facility]   Note: This dictation was prepared with Dragon dictation along with smaller phrase technology. Any transcriptional errors that result from this process are  unintentional.

## 2016-08-02 NOTE — Discharge Instructions (Signed)
Heart healthy diet

## 2016-08-02 NOTE — Plan of Care (Signed)
Problem: Safety: Goal: Ability to remain free from injury will improve Outcome: Progressing Patient encouraged to call for assistance if needed with activity.

## 2016-08-02 NOTE — Progress Notes (Signed)
SUBJECTIVE: Pt is feeling well, no chest pain or shortness of breath. However she does report a loss of appetite and GI upset.    Vitals:   08/02/16 0519 08/02/16 0634 08/02/16 0644 08/02/16 0733  BP: (!) 187/91 (!) 192/98 (!) 190/100 (!) 178/79  Pulse: (!) 111 (!) 115 (!) 115 (!) 111  Resp:    18  Temp:    98.3 F (36.8 C)  TempSrc:    Oral  SpO2:    98%  Weight:      Height:        Intake/Output Summary (Last 24 hours) at 08/02/16 0933 Last data filed at 08/02/16 0401  Gross per 24 hour  Intake           990.82 ml  Output              250 ml  Net           740.82 ml    LABS: Basic Metabolic Panel:  Recent Labs  08/01/16 1001 08/02/16 0807  NA 134* 134*  K 4.1 4.3  CL 106 105  CO2 21* 21*  GLUCOSE 75 82  BUN 14 13  CREATININE 1.63* 1.58*  CALCIUM 10.4* 10.6*   Liver Function Tests: No results for input(s): AST, ALT, ALKPHOS, BILITOT, PROT, ALBUMIN in the last 72 hours. No results for input(s): LIPASE, AMYLASE in the last 72 hours. CBC:  Recent Labs  08/01/16 1001 08/02/16 0807  WBC 9.4 10.2  HGB 9.9* 10.3*  HCT 31.7* 32.7*  MCV 68.5* 67.9*  PLT 354 370   Cardiac Enzymes: No results for input(s): CKTOTAL, CKMB, CKMBINDEX, TROPONINI in the last 72 hours. BNP: Invalid input(s): POCBNP D-Dimer: No results for input(s): DDIMER in the last 72 hours. Hemoglobin A1C: No results for input(s): HGBA1C in the last 72 hours. Fasting Lipid Panel: No results for input(s): CHOL, HDL, LDLCALC, TRIG, CHOLHDL, LDLDIRECT in the last 72 hours. Thyroid Function Tests: No results for input(s): TSH, T4TOTAL, T3FREE, THYROIDAB in the last 72 hours.  Invalid input(s): FREET3 Anemia Panel: No results for input(s): VITAMINB12, FOLATE, FERRITIN, TIBC, IRON, RETICCTPCT in the last 72 hours.   PHYSICAL EXAM General: Well developed, well nourished, in no acute distress HEENT:  Normocephalic and atramatic Neck:  No JVD.  Lungs: Clear bilaterally to auscultation and  percussion. Heart: HRRR . Normal S1 and S2 without gallops or murmurs.  Abdomen: Bowel sounds are positive, abdomen soft. Cath site is tender but no bleeding or hematoma.  Msk:  Back normal, normal gait. Normal strength and tone for age. Extremities: No clubbing, cyanosis or edema.   Neuro: Alert and oriented X 3. Psych:  Good affect, responds appropriately  TELEMETRY: Sinus tachycardia 107bpm  ASSESSMENT AND PLAN: Coronaries were normal on catheterization yesterday. Pt has sinus tachycardia although she reports no discomfort. Plan to keep on Metoprolol 100 BID. Pt may be discharged today if okay with GI and nephrology. Appt at 10am on Friday 4/6 with Dr. Neoma Laming is scheduled.   Active Problems:   NSTEMI (non-ST elevated myocardial infarction) (HCC)   Iron deficiency anemia due to chronic blood loss    Lauren Bathe, NP-C 08/02/2016 9:33 AM

## 2016-09-05 ENCOUNTER — Emergency Department: Payer: Self-pay

## 2016-09-05 ENCOUNTER — Emergency Department
Admission: EM | Admit: 2016-09-05 | Discharge: 2016-09-05 | Disposition: A | Discharge status: Home / Self Care | Payer: Self-pay | Attending: Emergency Medicine | Admitting: Emergency Medicine

## 2016-09-05 ENCOUNTER — Encounter: Payer: Self-pay | Admitting: Emergency Medicine

## 2016-09-05 DIAGNOSIS — I2510 Atherosclerotic heart disease of native coronary artery without angina pectoris: Secondary | ICD-10-CM | POA: Insufficient documentation

## 2016-09-05 DIAGNOSIS — I10 Essential (primary) hypertension: Secondary | ICD-10-CM | POA: Insufficient documentation

## 2016-09-05 DIAGNOSIS — Z79899 Other long term (current) drug therapy: Secondary | ICD-10-CM | POA: Insufficient documentation

## 2016-09-05 DIAGNOSIS — R05 Cough: Secondary | ICD-10-CM | POA: Insufficient documentation

## 2016-09-05 DIAGNOSIS — Z7982 Long term (current) use of aspirin: Secondary | ICD-10-CM | POA: Insufficient documentation

## 2016-09-05 DIAGNOSIS — R002 Palpitations: Secondary | ICD-10-CM | POA: Insufficient documentation

## 2016-09-05 DIAGNOSIS — R079 Chest pain, unspecified: Secondary | ICD-10-CM | POA: Insufficient documentation

## 2016-09-05 HISTORY — DX: Acute myocardial infarction, unspecified: I21.9

## 2016-09-05 HISTORY — DX: Atherosclerotic heart disease of native coronary artery without angina pectoris: I25.10

## 2016-09-05 LAB — CBC
HCT: 31.3 % — ABNORMAL LOW (ref 35.0–47.0)
Hemoglobin: 9.8 g/dL — ABNORMAL LOW (ref 12.0–16.0)
MCH: 22 pg — ABNORMAL LOW (ref 26.0–34.0)
MCHC: 31.3 g/dL — ABNORMAL LOW (ref 32.0–36.0)
MCV: 70.3 fL — AB (ref 80.0–100.0)
PLATELETS: 373 10*3/uL (ref 150–440)
RBC: 4.45 MIL/uL (ref 3.80–5.20)
RDW: 21.5 % — ABNORMAL HIGH (ref 11.5–14.5)
WBC: 6.6 10*3/uL (ref 3.6–11.0)

## 2016-09-05 LAB — BASIC METABOLIC PANEL
ANION GAP: 9 (ref 5–15)
BUN: 16 mg/dL (ref 6–20)
CHLORIDE: 105 mmol/L (ref 101–111)
CO2: 24 mmol/L (ref 22–32)
CREATININE: 1.65 mg/dL — AB (ref 0.44–1.00)
Calcium: 10.2 mg/dL (ref 8.9–10.3)
GFR calc non Af Amer: 37 mL/min — ABNORMAL LOW (ref >60)
GFR, EST AFRICAN AMERICAN: 42 mL/min — AB (ref >60)
Glucose, Bld: 100 mg/dL — ABNORMAL HIGH (ref 65–99)
Potassium: 3 mmol/L — ABNORMAL LOW (ref 3.5–5.1)
SODIUM: 138 mmol/L (ref 135–145)

## 2016-09-05 LAB — TROPONIN I
Troponin I: 0.04 ng/mL (ref <0.03)
Troponin I: 0.06 ng/mL (ref <0.03)

## 2016-09-05 MED ORDER — METOPROLOL TARTRATE 50 MG PO TABS
100.0000 mg | ORAL_TABLET | Freq: Once | ORAL | Status: AC
  Administered 2016-09-05: 100 mg via ORAL

## 2016-09-05 MED ORDER — METOPROLOL TARTRATE 100 MG PO TABS: 100.0000 mg | ORAL_TABLET | Freq: Two times a day (BID) | ORAL | 2 refills | Status: DC

## 2016-09-05 NOTE — ED Triage Notes (Signed)
C/O chest pain, palpitations this morning about one hour ago.  Patient describes similar episode 2 days ago that started after coughing.  Symptoms resolved with rest and taking deep breaths.

## 2016-09-05 NOTE — ED Notes (Signed)
Dr. Jimmye Norman notified of critical trop

## 2016-09-05 NOTE — ED Notes (Signed)
Patient transported to X-ray 

## 2016-09-05 NOTE — ED Notes (Addendum)
Pt returned from xray, EDP at bedside

## 2016-09-05 NOTE — ED Notes (Signed)
Pt resting in bed, given TV remote, playing on phone, awake and alert

## 2016-09-05 NOTE — ED Provider Notes (Signed)
[facility] Emergency Department Provider Note       Time seen: ----------------------------------------- 10:37 AM on 09/05/2016 -----------------------------------------     I have reviewed the triage vital signs and the nursing notes.   HISTORY   Chief Complaint Palpitations    HPI [name] is a 46 y.o. female who presents to the ED for chest pain with palpitations this morning that began about 1 hour ago. Patient describes similar episode 2 days ago started after coughing. Patient states symptoms resolve with rest and taking deep breaths. Patient reports being seen in the hospital last month and having a normal heart catheter. She was discharged with multiple medications. Patient states she has run out of one of her blue pills. She denies fevers, chills or other complaints.   Past Medical History:  Diagnosis Date  . Anemia   . Coronary artery disease   . Hypertension   . MI (myocardial infarction) [facility])     Patient Active Problem List   Diagnosis Date Noted  . Iron deficiency anemia due to chronic blood loss   . NSTEMI (non-ST elevated myocardial infarction) ([place]) 07/29/2016  . Severe anemia 09/13/2015  . IRREGULAR MENSES 05/28/2007  . COUGH 04/12/2007  . OBESITY, MORBID 11/24/2006  . ANEMIA-IRON DEFICIENCY 11/24/2006  . HYPERTENSION 11/24/2006    Past Surgical History:  Procedure Laterality Date  . LEFT HEART CATH AND CORONARY ANGIOGRAPHY Right 08/01/2016   Procedure: Left Heart Cath and Coronary Angiography;  Surgeon: Dionisio David, MD;  Location: [facility];  Service: Cardiovascular;  Laterality: Right;    Allergies Ace inhibitors and Latex  Social History Social History  Substance Use Topics  . Smoking status: Never Smoker  . Smokeless tobacco: Never Used  . Alcohol use No    Review of Systems Constitutional: Negative for fever. Eyes: Negative for vision changes ENT:  Negative for congestion, sore  throat Cardiovascular: Positive for chest pain, palpitations Respiratory: Positive for cough Gastrointestinal: Negative for abdominal pain, vomiting and diarrhea. Genitourinary: Negative for dysuria. Musculoskeletal: Negative for back pain. Skin: Negative for rash. Neurological: Negative for headaches, focal weakness or numbness.  All systems negative/normal/unremarkable except as stated in the HPI  ____________________________________________   PHYSICAL EXAM:  VITAL SIGNS: ED Triage Vitals  Enc Vitals Group     BP 09/05/16 1017 (!) 158/90     Pulse Rate 09/05/16 1017 (!) 121     Resp 09/05/16 1017 18     Temp 09/05/16 1017 98.3 F (36.8 C)     Temp src --      SpO2 09/05/16 1017 96 %     Weight 09/05/16 1018 268 lb (121.6 kg)     Height 09/05/16 1018 5\' 3"  (1.6 m)     Head Circumference --      Peak Flow --      Pain Score 09/05/16 1017 0     Pain Loc --      Pain Edu? --      Excl. in [place]? --     Constitutional: Alert and oriented. Well appearing and in no distress. Eyes: Conjunctivae are normal. PERRL. Normal extraocular movements. ENT   Head: Normocephalic and atraumatic.   Nose: No congestion/rhinnorhea.   Mouth/Throat: Mucous membranes are moist.   Neck: No stridor. Cardiovascular: Rapid rate, regular rhythm. No murmurs, rubs, or gallops. Respiratory: Normal respiratory effort without tachypnea nor retractions. Breath sounds are clear and equal bilaterally. No wheezes/rales/rhonchi. Gastrointestinal: Soft and nontender. Normal bowel sounds Musculoskeletal: Nontender  with normal range of motion in extremities. No lower extremity tenderness nor edema. Neurologic:  Normal speech and language. No gross focal neurologic deficits are appreciated.  Skin:  Skin is warm, dry and intact. No rash noted. Psychiatric: Mood and affect are normal. Speech and behavior are normal.  ____________________________________________  EKG: Interpreted by me. Sinus  tachycardia with occasional PVCs, rate is 125 bpm, LVH, normal QT  ____________________________________________  ED COURSE:  Pertinent labs & imaging results that were available during my care of the patient were reviewed by me and considered in my medical decision making (see chart for details). Patient presents for chest pain and palpitations, we will assess with labs and imaging as indicated.   Procedures ____________________________________________   LABS (pertinent positives/negatives)  Labs Reviewed  BASIC METABOLIC PANEL - Abnormal; Notable for the following:       Result Value   Potassium 3.0 (*)    Glucose, Bld 100 (*)    Creatinine, Ser 1.65 (*)    GFR calc non Af Amer 37 (*)    GFR calc Af Amer 42 (*)    All other components within normal limits  CBC - Abnormal; Notable for the following:    Hemoglobin 9.8 (*)    HCT 31.3 (*)    MCV 70.3 (*)    MCH 22.0 (*)    MCHC 31.3 (*)    RDW 21.5 (*)    All other components within normal limits  TROPONIN I - Abnormal; Notable for the following:    Troponin I 0.04 (*)    All other components within normal limits  TROPONIN I - Abnormal; Notable for the following:    Troponin I 0.06 (*)    All other components within normal limits    RADIOLOGY Images were viewed by me  Chest x-ray IMPRESSION: No evidence of DVT within the right lower extremity.  Soft tissue fluid collection as described. ____________________________________________  FINAL ASSESSMENT AND PLAN  Chest pain, palpitations  Plan: Patient's labs and imaging were dictated above. Patient had presented for Nonspecific symptoms. She had inadvertently stopped taking her metoprolol. I did discuss with her cardiologist who performed a heart catheterization last month. She had a clean catheterization several is unclear why her troponin has slightly increased. We have restarted her metoprolol and he will see her in the office in 48 hours for recheck.   Earleen Newport, MD   Note: This note was generated in part or whole with voice recognition software. Voice recognition is usually quite accurate but there are transcription errors that can and very often do occur. I apologize for any typographical errors that were not detected and corrected.     Earleen Newport, MD 09/05/16 1322

## 2016-09-11 ENCOUNTER — Ambulatory Visit: Payer: BLUE CROSS/BLUE SHIELD

## 2016-10-13 ENCOUNTER — Ambulatory Visit: Payer: BLUE CROSS/BLUE SHIELD | Admitting: Pharmacy Technician

## 2016-10-13 NOTE — Progress Notes (Signed)
Patient scheduled for eligibility appointment at [facility].  Patient did not show for the appointment on October 13, 2016 at 10:30a.m.  Patient did not reschedule eligibility appointment.  [facility] unable to provide additional medication assistance until eligibility is determined.  [facility]

## 2016-11-04 ENCOUNTER — Emergency Department: Payer: Self-pay

## 2016-11-04 ENCOUNTER — Encounter: Payer: Self-pay | Admitting: Emergency Medicine

## 2016-11-04 ENCOUNTER — Inpatient Hospital Stay
Admission: EM | Admit: 2016-11-04 | Discharge: 2016-11-10 | DRG: 742 | Disposition: A | Discharge status: Home / Self Care | Payer: Self-pay | Attending: Obstetrics and Gynecology | Admitting: Obstetrics and Gynecology

## 2016-11-04 DIAGNOSIS — R1084 Generalized abdominal pain: Secondary | ICD-10-CM

## 2016-11-04 DIAGNOSIS — Z91040 Latex allergy status: Secondary | ICD-10-CM

## 2016-11-04 DIAGNOSIS — I252 Old myocardial infarction: Secondary | ICD-10-CM

## 2016-11-04 DIAGNOSIS — D631 Anemia in chronic kidney disease: Secondary | ICD-10-CM | POA: Diagnosis present

## 2016-11-04 DIAGNOSIS — N183 Chronic kidney disease, stage 3 (moderate): Secondary | ICD-10-CM | POA: Diagnosis present

## 2016-11-04 DIAGNOSIS — K5900 Constipation, unspecified: Secondary | ICD-10-CM | POA: Diagnosis present

## 2016-11-04 DIAGNOSIS — N1330 Unspecified hydronephrosis: Secondary | ICD-10-CM | POA: Diagnosis present

## 2016-11-04 DIAGNOSIS — N920 Excessive and frequent menstruation with regular cycle: Secondary | ICD-10-CM | POA: Diagnosis present

## 2016-11-04 DIAGNOSIS — Z6841 Body Mass Index (BMI) 40.0 and over, adult: Secondary | ICD-10-CM

## 2016-11-04 DIAGNOSIS — I129 Hypertensive chronic kidney disease with stage 1 through stage 4 chronic kidney disease, or unspecified chronic kidney disease: Secondary | ICD-10-CM | POA: Diagnosis present

## 2016-11-04 DIAGNOSIS — I10 Essential (primary) hypertension: Secondary | ICD-10-CM

## 2016-11-04 DIAGNOSIS — D259 Leiomyoma of uterus, unspecified: Principal | ICD-10-CM | POA: Diagnosis present

## 2016-11-04 DIAGNOSIS — R748 Abnormal levels of other serum enzymes: Secondary | ICD-10-CM | POA: Diagnosis present

## 2016-11-04 DIAGNOSIS — Z7982 Long term (current) use of aspirin: Secondary | ICD-10-CM

## 2016-11-04 LAB — COMPREHENSIVE METABOLIC PANEL
ALBUMIN: 3.2 g/dL — AB (ref 3.5–5.0)
ALT: 60 U/L — ABNORMAL HIGH (ref 14–54)
ANION GAP: 10 (ref 5–15)
AST: 61 U/L — ABNORMAL HIGH (ref 15–41)
Alkaline Phosphatase: 82 U/L (ref 38–126)
BILIRUBIN TOTAL: 0.5 mg/dL (ref 0.3–1.2)
BUN: 20 mg/dL (ref 6–20)
CO2: 26 mmol/L (ref 22–32)
Calcium: 10.1 mg/dL (ref 8.9–10.3)
Chloride: 100 mmol/L — ABNORMAL LOW (ref 101–111)
Creatinine, Ser: 1.76 mg/dL — ABNORMAL HIGH (ref 0.44–1.00)
GFR calc non Af Amer: 34 mL/min — ABNORMAL LOW (ref >60)
GFR, EST AFRICAN AMERICAN: 39 mL/min — AB (ref >60)
GLUCOSE: 92 mg/dL (ref 65–99)
POTASSIUM: 2.9 mmol/L — AB (ref 3.5–5.1)
Sodium: 136 mmol/L (ref 135–145)
TOTAL PROTEIN: 8.7 g/dL — AB (ref 6.5–8.1)

## 2016-11-04 LAB — URINALYSIS, COMPLETE (UACMP) WITH MICROSCOPIC
BACTERIA UA: NONE SEEN
BILIRUBIN URINE: NEGATIVE
GLUCOSE, UA: NEGATIVE mg/dL
KETONES UR: NEGATIVE mg/dL
LEUKOCYTES UA: NEGATIVE
NITRITE: NEGATIVE
PH: 5 (ref 5.0–8.0)
Specific Gravity, Urine: 1.017 (ref 1.005–1.030)

## 2016-11-04 LAB — LIPASE, BLOOD: Lipase: 33 U/L (ref 11–51)

## 2016-11-04 LAB — CBC
HEMATOCRIT: 34.1 % — AB (ref 35.0–47.0)
HEMOGLOBIN: 10.5 g/dL — AB (ref 12.0–16.0)
MCH: 22.4 pg — ABNORMAL LOW (ref 26.0–34.0)
MCHC: 30.7 g/dL — AB (ref 32.0–36.0)
MCV: 73 fL — ABNORMAL LOW (ref 80.0–100.0)
Platelets: 378 10*3/uL (ref 150–440)
RBC: 4.67 MIL/uL (ref 3.80–5.20)
RDW: 21.1 % — ABNORMAL HIGH (ref 11.5–14.5)
WBC: 7.7 10*3/uL (ref 3.6–11.0)

## 2016-11-04 LAB — PREPARE RBC (CROSSMATCH)

## 2016-11-04 LAB — TROPONIN I: Troponin I: 0.04 ng/mL (ref <0.03)

## 2016-11-04 MED ORDER — ATORVASTATIN CALCIUM 40 MG PO TABS
40.0000 mg | ORAL_TABLET | Freq: Every day | ORAL | Status: DC
  Administered 2016-11-05: 40 mg via ORAL
  Filled 2016-11-04: qty 1

## 2016-11-04 MED ORDER — HYDRALAZINE HCL 20 MG/ML IJ SOLN
10.0000 mg | Freq: Once | INTRAMUSCULAR | Status: AC
  Administered 2016-11-04: 10 mg via INTRAVENOUS
  Filled 2016-11-04: qty 1

## 2016-11-04 MED ORDER — HYDROMORPHONE HCL 1 MG/ML IJ SOLN: 0.2000 mg | INTRAMUSCULAR | Status: DC | PRN

## 2016-11-04 MED ORDER — SODIUM CHLORIDE 0.9 % IV SOLN
Freq: Once | INTRAVENOUS | Status: AC
  Administered 2016-11-05: via INTRAVENOUS

## 2016-11-04 MED ORDER — ONDANSETRON HCL 4 MG/2ML IJ SOLN
4.0000 mg | Freq: Four times a day (QID) | INTRAMUSCULAR | Status: DC | PRN
Start: 2016-11-04 — End: 2016-11-10

## 2016-11-04 MED ORDER — ALUM & MAG HYDROXIDE-SIMETH 200-200-20 MG/5ML PO SUSP: 30.0000 mL | ORAL | Status: DC | PRN

## 2016-11-04 MED ORDER — ONDANSETRON HCL 4 MG PO TABS: 4.0000 mg | ORAL_TABLET | Freq: Four times a day (QID) | ORAL | Status: DC | PRN

## 2016-11-04 MED ORDER — BISACODYL 5 MG PO TBEC
5.0000 mg | DELAYED_RELEASE_TABLET | Freq: Every day | ORAL | Status: DC | PRN
  Administered 2016-11-07 – 2016-11-09 (×2): 5 mg via ORAL
  Filled 2016-11-04 (×3): qty 1

## 2016-11-04 MED ORDER — HYDRALAZINE HCL 20 MG/ML IJ SOLN
5.0000 mg | INTRAMUSCULAR | Status: AC | PRN
  Administered 2016-11-04: 10 mg via INTRAVENOUS
  Administered 2016-11-05: 5 mg via INTRAVENOUS
  Filled 2016-11-04 (×3): qty 0.5

## 2016-11-04 MED ORDER — HYDRALAZINE HCL 50 MG PO TABS
100.0000 mg | ORAL_TABLET | Freq: Three times a day (TID) | ORAL | Status: DC
  Administered 2016-11-04 – 2016-11-10 (×15): 100 mg via ORAL
  Filled 2016-11-04 (×20): qty 2

## 2016-11-04 MED ORDER — LACTATED RINGERS IV SOLN: INTRAVENOUS | Status: DC

## 2016-11-04 MED ORDER — ENOXAPARIN SODIUM 40 MG/0.4ML ~~LOC~~ SOLN
30.0000 mg | SUBCUTANEOUS | Status: DC
  Administered 2016-11-05: 30 mg via SUBCUTANEOUS
  Filled 2016-11-04: qty 0.4

## 2016-11-04 MED ORDER — IOPAMIDOL (ISOVUE-300) INJECTION 61%
80.0000 mL | Freq: Once | INTRAVENOUS | Status: AC | PRN
  Administered 2016-11-04: 80 mL via INTRAVENOUS

## 2016-11-04 MED ORDER — DIPHENHYDRAMINE HCL 25 MG PO CAPS
25.0000 mg | ORAL_CAPSULE | Freq: Once | ORAL | Status: AC
  Administered 2016-11-04: 25 mg via ORAL
  Filled 2016-11-04: qty 1

## 2016-11-04 MED ORDER — PRENATAL MULTIVITAMIN CH
1.0000 | ORAL_TABLET | Freq: Every day | ORAL | Status: DC
  Administered 2016-11-05: 1 via ORAL
  Filled 2016-11-04 (×3): qty 1

## 2016-11-04 MED ORDER — POTASSIUM CHLORIDE CRYS ER 20 MEQ PO TBCR
40.0000 meq | EXTENDED_RELEASE_TABLET | Freq: Two times a day (BID) | ORAL | Status: DC
  Administered 2016-11-04 – 2016-11-05 (×3): 40 meq via ORAL
  Filled 2016-11-04 (×3): qty 2

## 2016-11-04 MED ORDER — DOCUSATE SODIUM 100 MG PO CAPS: 100.0000 mg | ORAL_CAPSULE | Freq: Two times a day (BID) | ORAL | Status: DC

## 2016-11-04 MED ORDER — METOPROLOL TARTRATE 50 MG PO TABS
100.0000 mg | ORAL_TABLET | Freq: Two times a day (BID) | ORAL | Status: DC
  Administered 2016-11-04 – 2016-11-10 (×12): 100 mg via ORAL
  Filled 2016-11-04 (×13): qty 2

## 2016-11-04 MED ORDER — MAGNESIUM CITRATE PO SOLN: 1.0000 | Freq: Once | ORAL | Status: DC | PRN

## 2016-11-04 MED ORDER — LABETALOL HCL 5 MG/ML IV SOLN
20.0000 mg | INTRAVENOUS | Status: DC | PRN
  Filled 2016-11-04: qty 8

## 2016-11-04 MED ORDER — ZOLPIDEM TARTRATE 5 MG PO TABS: 5.0000 mg | ORAL_TABLET | Freq: Every evening | ORAL | Status: DC | PRN

## 2016-11-04 MED ORDER — ACETAMINOPHEN 325 MG PO TABS
650.0000 mg | ORAL_TABLET | Freq: Once | ORAL | Status: AC
  Administered 2016-11-04: 650 mg via ORAL
  Filled 2016-11-04: qty 2

## 2016-11-04 MED ORDER — OXYCODONE-ACETAMINOPHEN 5-325 MG PO TABS: 1.0000 | ORAL_TABLET | ORAL | Status: DC | PRN

## 2016-11-04 MED ORDER — TRANEXAMIC ACID 1000 MG/10ML IV SOLN
1000.0000 mg | INTRAVENOUS | Status: AC
  Administered 2016-11-06: 1000 mg via INTRAVENOUS
  Filled 2016-11-04: qty 10

## 2016-11-04 MED ORDER — DOCUSATE SODIUM 100 MG PO CAPS
100.0000 mg | ORAL_CAPSULE | Freq: Two times a day (BID) | ORAL | Status: DC
  Administered 2016-11-04 – 2016-11-10 (×11): 100 mg via ORAL
  Filled 2016-11-04 (×11): qty 1

## 2016-11-04 MED ORDER — SODIUM CHLORIDE 0.9 % IV BOLUS (SEPSIS)
1000.0000 mL | Freq: Once | INTRAVENOUS | Status: AC
  Administered 2016-11-04: 1000 mL via INTRAVENOUS

## 2016-11-04 MED ORDER — MAGNESIUM HYDROXIDE 400 MG/5ML PO SUSP: 30.0000 mL | Freq: Every day | ORAL | Status: DC | PRN

## 2016-11-04 NOTE — ED Provider Notes (Signed)
[facility] Emergency Department Provider Note  ____________________________________________   First MD Initiated Contact with Patient 11/04/16 1150     (approximate)  I have reviewed the triage vital signs and the nursing notes.   HISTORY  Chief Complaint Abdominal Pain and Constipation   HPI [name] is a 46 y.o. female who self presents to the emergency department with 2 months of worsening abdominal distention and moderate severity cramping left greater than right abdominal discomfort worse when lying on her left side. She said she believes this is her uterine fibroids and she has a known history of large fibroids however she is unable to see her OB gynecologist secondary to insurance issues. She said that when she was last seen by her OB gynecologist roughly 1 year ago they were discussing possible hysterectomy. She has had a continuous menstrual period since roughly November. She does not menstruate every day but most days.   Past Medical History:  Diagnosis Date  . Anemia   . Coronary artery disease   . Hypertension   . MI (myocardial infarction) [facility])     Patient Active Problem List   Diagnosis Date Noted  . Fibroid uterus 11/04/2016  . Iron deficiency anemia due to chronic blood loss   . NSTEMI (non-ST elevated myocardial infarction) ([place]) 07/29/2016  . Severe anemia 09/13/2015  . IRREGULAR MENSES 05/28/2007  . COUGH 04/12/2007  . OBESITY, MORBID 11/24/2006  . ANEMIA-IRON DEFICIENCY 11/24/2006  . HYPERTENSION 11/24/2006    Past Surgical History:  Procedure Laterality Date  . LEFT HEART CATH AND CORONARY ANGIOGRAPHY Right 08/01/2016   Procedure: Left Heart Cath and Coronary Angiography;  Surgeon: Dionisio David, MD;  Location: [facility];  Service: Cardiovascular;  Laterality: Right;    Prior to Admission medications   Medication Sig Start Date End Date Taking? Authorizing Provider  aspirin 325 MG tablet Take 1 tablet  (325 mg total) by mouth daily. Patient taking differently: Take 81 mg by mouth 2 (two) times daily.  08/03/16  Yes Demetrios Loll, MD  furosemide (LASIX) 20 MG tablet Take 20 mg by mouth daily.   Yes [provider]  hydrALAZINE (APRESOLINE) 100 MG tablet Take 1 tablet (100 mg total) by mouth every 8 (eight) hours. 08/02/16  Yes Demetrios Loll, MD  metoprolol (LOPRESSOR) 100 MG tablet Take 1 tablet (100 mg total) by mouth 2 (two) times daily. 09/05/16  Yes Earleen Newport, MD  norethindrone-ethinyl estradiol 1/35 ([place] 1/35, 28,) tablet Take 1 tablet by mouth daily. 1 po tid x 3 days then 1 po bid x 2 days then qd . Dispose of the last 7 tabs and restart new pack Patient taking differently: Take 1 tablet by mouth daily.  09/14/15  Yes Schermerhorn, Gwen Her, MD  atorvastatin (LIPITOR) 40 MG tablet Take 1 tablet (40 mg total) by mouth daily at 6 PM. Patient not taking: Reported on 09/05/2016 08/02/16   Demetrios Loll, MD  docusate sodium (COLACE) 100 MG capsule Take 1 capsule (100 mg total) by mouth 2 (two) times daily. Patient not taking: Reported on 07/29/2016 09/14/15   Schermerhorn, Gwen Her, MD  isosorbide mononitrate (IMDUR) 30 MG 24 hr tablet Take 1 tablet (30 mg total) by mouth daily. Patient not taking: Reported on 09/05/2016 08/03/16   Demetrios Loll, MD  ondansetron (ZOFRAN) 4 MG tablet Take 1 tablet (4 mg total) by mouth every 6 (six) hours as needed for nausea. Patient not taking: Reported on 09/05/2016 09/14/15  Schermerhorn, Gwen Her, MD    Allergies Ace inhibitors and Latex  Family History  Problem Relation Age of Onset  . CAD Father     Social History Social History  Substance Use Topics  . Smoking status: Never Smoker  . Smokeless tobacco: Never Used  . Alcohol use No    Review of Systems Constitutional: No fever/chills Eyes: No visual changes. ENT: No sore throat. Cardiovascular: Denies chest pain. Respiratory: Denies shortness of breath. Gastrointestinal: Positive  abdominal pain.  No nausea, no vomiting.  No diarrhea.  No constipation. Genitourinary: Negative for dysuria. Musculoskeletal: Negative for back pain. Skin: Negative for rash. Neurological: Negative for headaches, focal weakness or numbness.   ____________________________________________   PHYSICAL EXAM:  VITAL SIGNS: ED Triage Vitals [11/04/16 1130]  Enc Vitals Group     BP (!) 185/113     Pulse Rate 86     Resp 16     Temp 98.6 F (37 C)     Temp Source Oral     SpO2 100 %     Weight 250 lb (113.4 kg)     Height 5\' 3"  (1.6 m)     Head Circumference      Peak Flow      Pain Score      Pain Loc      Pain Edu?      Excl. in [place]?     Constitutional: Alert and oriented 4 lying completely flat towards her left side appears extremely uncomfortable Eyes: PERRL EOMI. Head: Atraumatic. Nose: No congestion/rhinnorhea. Mouth/Throat: No trismus Neck: No stridor.   Cardiovascular: Normal rate, regular rhythm. Grossly normal heart sounds.  Good peripheral circulation. Respiratory: Normal respiratory effort.  No retractions. Lungs CTAB and moving good air Gastrointestinal: Obese abdomen with hard masses palpable primarily throughout the left side of her pelvis and up above the umbilicus Musculoskeletal: No lower extremity edema   Neurologic:  Normal speech and language. No gross focal neurologic deficits are appreciated. Skin:  Skin is warm, dry and intact. No rash noted. Psychiatric: Mood and affect are normal. Speech and behavior are normal.    ____________________________________________   DIFFERENTIAL includes but not limited to  Appendicitis, diverticulitis, renal colic, fibroids, anemia ____________________________________________   LABS (all labs ordered are listed, but only abnormal results are displayed)  Labs Reviewed  COMPREHENSIVE METABOLIC PANEL - Abnormal; Notable for the following:       Result Value   Potassium 2.9 (*)    Chloride 100 (*)     Creatinine, Ser 1.76 (*)    Total Protein 8.7 (*)    Albumin 3.2 (*)    AST 61 (*)    ALT 60 (*)    GFR calc non Af Amer 34 (*)    GFR calc Af Amer 39 (*)    All other components within normal limits  CBC - Abnormal; Notable for the following:    Hemoglobin 10.5 (*)    HCT 34.1 (*)    MCV 73.0 (*)    MCH 22.4 (*)    MCHC 30.7 (*)    RDW 21.1 (*)    All other components within normal limits  URINALYSIS, COMPLETE (UACMP) WITH MICROSCOPIC - Abnormal; Notable for the following:    Color, Urine AMBER (*)    APPearance CLOUDY (*)    Hgb urine dipstick LARGE (*)    Protein, ur >=300 (*)    Squamous Epithelial / LPF 0-5 (*)    All other components within normal limits  TROPONIN I - Abnormal; Notable for the following:    Troponin I 0.04 (*)    All other components within normal limits  RENAL FUNCTION PANEL - Abnormal; Notable for the following:    Creatinine, Ser 1.67 (*)    Phosphorus 2.3 (*)    Albumin 2.9 (*)    GFR calc non Af Amer 36 (*)    GFR calc Af Amer 42 (*)    All other components within normal limits  CBC WITH DIFFERENTIAL/PLATELET - Abnormal; Notable for the following:    Hemoglobin 11.8 (*)    MCV 74.0 (*)    MCH 23.2 (*)    MCHC 31.4 (*)    RDW 21.6 (*)    All other components within normal limits  COMPREHENSIVE METABOLIC PANEL - Abnormal; Notable for the following:    Glucose, Bld 110 (*)    Creatinine, Ser 1.68 (*)    Albumin 2.8 (*)    AST 101 (*)    ALT 70 (*)    Total Bilirubin 1.3 (*)    GFR calc non Af Amer 36 (*)    GFR calc Af Amer 41 (*)    All other components within normal limits  LIPASE, BLOOD  TROPONIN I  TROPONIN I  POC URINE PREG, ED  TYPE AND SCREEN  PREPARE RBC (CROSSMATCH)    Hemoglobin at her baseline __________________________________________  EKG  ED ECG REPORT I, Darel Hong, the attending physician, personally viewed and interpreted this ECG.  Date: 11/04/2016 EKG Time:  Rate: 66 Rhythm: normal sinus rhythm QRS  Axis: normal Intervals: normal ST/T Wave abnormalities: Left ventricular hypertrophy with repolarization abnormality and no signs of acute ischemia Narrative Interpretation: Abnormal but unchanged  ____________________________________________  RADIOLOGY  CT scan shows "massive uterus" ____________________________________________   PROCEDURES  Procedure(s) performed: no  Procedures  Critical Care performed: no  Observation: no ____________________________________________   INITIAL IMPRESSION / ASSESSMENT AND PLAN / ED COURSE  Pertinent labs & imaging results that were available during my care of the patient were reviewed by me and considered in my medical decision making (see chart for details).  The patient arrives uncomfortable appearing with an abdominal exam concerning for extremely large uterus. The mass appears to progress all the way up towards near her liver which would be unusual. At this point I believe she warrants a CT scan with IV contrast especially given her history of ovarian mass that was not fully appreciated on the previous CT non-con.  ----------------------------------------- 2:45 PM on 11/04/2016 -----------------------------------------  The patient's CT scan shows a "massive uterus" causing bilateral hydronephrosis. The patient does have chronic kidney disease and does not appear to have any acute kidney injury. I will reach out to Dr. Leafy Ro on call for Dr. Ouida Sills for a consult. ____________________________________________  ----------------------------------------- 2:59 PM on 11/04/2016 -----------------------------------------  I spoke with Dr. Leafy Ro who recommends inpatient admission with total abdominal hysterectomy given the severity of the patient's symptoms. The patient agrees.   FINAL CLINICAL IMPRESSION(S) / ED DIAGNOSES  Final diagnoses:  Uterine leiomyoma, unspecified location  Hydronephrosis, unspecified hydronephrosis type    Hypertension, unspecified type  Generalized abdominal pain      NEW MEDICATIONS STARTED DURING THIS VISIT:  Current Discharge Medication List       Note:  This document was prepared using Dragon voice recognition software and may include unintentional dictation errors.     Darel Hong, MD 11/05/16 1945

## 2016-11-04 NOTE — ED Triage Notes (Signed)
Patient presents to the ED with abdominal pain.  Patient states abdomen has been tender and somewhat distended x 1 month.  Patient states she feels her left side is more distended than the right side of her abdomen but the right side is more painful.  Patient states, "I think it's my fibroids, it seems like they are getting bigger."  Patient states she has had vaginal bleeding/spotting, since November.  Patient has history of hypertension and states she has not yet taken her medication today.

## 2016-11-04 NOTE — ED Notes (Signed)
URINE POC NEGATIVE

## 2016-11-04 NOTE — H&P (Signed)
Consult History and Physical   SERVICE: Gynecology   Patient Name: [name] Patient MRN:   [id number]  CC: Abdominal pain and mass  HPI: [name] is a 46 y.o. G0 with a History of uterine fibroids who presents with increasing pain, constipation, lightheadedness, and persistent vaginal bleeding. Imaging last year noted a 20 cm uterus, with a normal endometrial biopsy and normal Pap smear at that time, and she was admitted for symptomatic anemia with a blood transfusion. She states she's had 6 blood transfusion since she was diagnosed with fibroids. She now states she is wanting to undergo whatever surgery is required to take this uterus out.  Of note, she had a possible N-STEMI in March 2018 with a normal cardiac catheter and mildly elevated troponins. She also has high blood pressure, and is on metoprolol, which she did not take today.  No bladder sx. +Constipation. See imaging below  Review of Systems: positives in bold GEN:   fevers, chills, weight changes, appetite changes, fatigue, night sweats HEENT:  HA, vision changes, hearing loss, congestion, rhinorrhea, sinus pressure, dysphagia CV:   CP, palpitations PULM:  SOB, cough GI:  abd pain, N/V/D/C GU:  dysuria, urgency, frequency MSK:  arthralgias, myalgias, back pain, swelling SKIN:  rashes, color changes, pallor NEURO:  numbness, weakness, tingling, seizures, dizziness, tremors PSYCH:  depression, anxiety, behavioral problems, confusion  HEME/LYMPH:  easy bruising or bleeding ENDO:  heat/cold intolerance  Past Obstetrical History: OB History    No data available      Past Gynecologic History: No LMP recorded. Patient is not currently having periods (Reason: Irregular Periods).   Past Medical History: Past Medical History:  Diagnosis Date  . Anemia   . Coronary artery disease   . Hypertension   . MI (myocardial infarction) [facility])     Past Surgical History:   Past Surgical History:  Procedure  Laterality Date  . LEFT HEART CATH AND CORONARY ANGIOGRAPHY Right 08/01/2016   Procedure: Left Heart Cath and Coronary Angiography;  Surgeon: Dionisio David, MD;  Location: [facility];  Service: Cardiovascular;  Laterality: Right;    Family History:  family history includes CAD in her father.  Social History:  Social History   Social History  . Marital status: Single    Spouse name: N/A  . Number of children: N/A  . Years of education: N/A   Occupational History  . Not on file.   Social History Main Topics  . Smoking status: Never Smoker  . Smokeless tobacco: Never Used  . Alcohol use No  . Drug use: No  . Sexual activity: Not on file   Other Topics Concern  . Not on file   Social History Narrative  . No narrative on file    Home Medications:  Medications reconciled in EPIC  No current facility-administered medications on file prior to encounter.    Current Outpatient Prescriptions on File Prior to Encounter  Medication Sig Dispense Refill  . aspirin 325 MG tablet Take 1 tablet (325 mg total) by mouth daily. (Patient taking differently: Take 81 mg by mouth 2 (two) times daily. ) 30 tablet 2  . hydrALAZINE (APRESOLINE) 100 MG tablet Take 1 tablet (100 mg total) by mouth every 8 (eight) hours. 90 tablet 2  . metoprolol (LOPRESSOR) 100 MG tablet Take 1 tablet (100 mg total) by mouth 2 (two) times daily. 30 tablet 2  . norethindrone-ethinyl estradiol 1/35 ([place] 1/35, 28,) tablet Take 1 tablet by mouth daily. 1  po tid x 3 days then 1 po bid x 2 days then qd . Dispose of the last 7 tabs and restart new pack (Patient taking differently: Take 1 tablet by mouth daily. ) 2 Package 11  . atorvastatin (LIPITOR) 40 MG tablet Take 1 tablet (40 mg total) by mouth daily at 6 PM. (Patient not taking: Reported on 09/05/2016) 30 tablet 2  . docusate sodium (COLACE) 100 MG capsule Take 1 capsule (100 mg total) by mouth 2 (two) times daily. (Patient not taking: Reported on  07/29/2016) 60 capsule 0  . isosorbide mononitrate (IMDUR) 30 MG 24 hr tablet Take 1 tablet (30 mg total) by mouth daily. (Patient not taking: Reported on 09/05/2016) 30 tablet 2  . ondansetron (ZOFRAN) 4 MG tablet Take 1 tablet (4 mg total) by mouth every 6 (six) hours as needed for nausea. (Patient not taking: Reported on 09/05/2016) 30 tablet 0    Allergies:  Allergies  Allergen Reactions  . Ace Inhibitors Cough  . Latex Swelling    Physical Exam:  Temp:  [98.6 F (37 C)] 98.6 F (37 C) (07/07 1130) Pulse Rate:  [66-88] 66 (07/07 1741) Resp:  [16] 16 (07/07 1741) BP: (131-233)/(100-113) 131/100 (07/07 1824) SpO2:  [100 %] 100 % (07/07 1741) Weight:  [250 lb (113.4 kg)] 250 lb (113.4 kg) (07/07 1130)   General Appearance:  Well developed, well nourished, no acute distress, alert and oriented x3 HEENT:  Normocephalic atraumatic, extraocular movements intact, moist mucous membranes, poor dentition Cardiovascular:  Normal S1/S2, regular rate and rhythm, no murmurs Pulmonary:  clear to auscultation, no wheezes, rales or rhonchi, symmetric air entry, good air exchange Abdomen:  Bowel sounds present, soft, diffusely tender, +fundal mass to 3cm below ziphoid Extremities:  Full range of motion, no pedal edema, 2+ distal pulses, no tenderness Skin:  normal coloration and turgor, no rashes, no suspicious skin lesions noted  Neurologic:  Cranial nerves 2-12 grossly intact, normal muscle tone, strength 5/5 all four extremities Psychiatric:  Normal mood and affect, appropriate, no AH/VH Pelvic:  NEFG, no vulvar masses or lesions, normal vaginal mucosa, minimal vaginal bleeding , no discharge    Labs/Studies:   CBC and Coags:  Lab Results  Component Value Date   WBC 7.7 11/04/2016   NEUTOPHILPCT 65 07/29/2016   EOSPCT 3 07/29/2016   BASOPCT 1 07/29/2016   LYMPHOPCT 22 07/29/2016   HGB 10.5 (L) 11/04/2016   HCT 34.1 (L) 11/04/2016   MCV 73.0 (L) 11/04/2016   PLT 378 11/04/2016   INR  0.95 08/01/2016   CMP:  Lab Results  Component Value Date   NA 136 11/04/2016   K 2.9 (L) 11/04/2016   CL 100 (L) 11/04/2016   CO2 26 11/04/2016   BUN 20 11/04/2016   CREATININE 1.76 (H) 11/04/2016   CREATININE 1.65 (H) 09/05/2016   CREATININE 1.58 (H) 08/02/2016   PROT 8.7 (H) 11/04/2016   BILITOT 0.5 11/04/2016   ALT 60 (H) 11/04/2016   AST 61 (H) 11/04/2016   ALKPHOS 82 11/04/2016   Imaging: Dg Chest 1 View  Result Date: 11/04/2016 CLINICAL DATA:  Non smoker, hx of MI (4-18), now preop for hysterectomy EXAM: CHEST 1 VIEW COMPARISON:  09/05/2016 FINDINGS: Midline trachea. Mild cardiomegaly, accentuated by AP portable technique. No pleural effusion or pneumothorax. No congestive failure. Clear lungs. IMPRESSION: Cardiomegaly without congestive failure. Electronically Signed   By: Abigail Miyamoto M.D.   On: 11/04/2016 15:23   Ct Abdomen Pelvis W Contrast  Result Date:  11/04/2016 CLINICAL DATA:  Abdominal pain and distention. Vaginal bleeding and history of uterine fibroids. EXAM: CT ABDOMEN AND PELVIS WITH CONTRAST TECHNIQUE: Multidetector CT imaging of the abdomen and pelvis was performed using the standard protocol following bolus administration of intravenous contrast. CONTRAST:  44mL ISOVUE-300 IOPAMIDOL (ISOVUE-300) INJECTION 61% COMPARISON:  CT of the abdomen and pelvis on 06/14/2016 and pelvic ultrasound on 09/14/2015. FINDINGS: Lower chest: No acute abnormality. Hepatobiliary: Stable rim calcified gallstones without evidence of gallbladder distention or inflammation. No biliary dilatation identified. The liver is unremarkable. Pancreas: Unremarkable. No pancreatic ductal dilatation or surrounding inflammatory changes. Spleen: Normal in size without focal abnormality. Adrenals/Urinary Tract: Adrenal glands are unremarkable. Both kidneys demonstrate mild to moderate hydronephrosis and there is evidence of delayed excretion of contrast in the collecting systems bilaterally on delayed  imaging. This is felt to be secondary to mass effect on the bladder and distal ureters by a massively enlarged uterus. The bladder is compressed from above by the enlarged uterus. No evidence of urinary tract calculi. Bilateral renal cysts are stable in appearance compared to the prior CT. Stomach/Bowel: No evidence of bowel obstruction, inflammation or free air. Pelvic bowel loops are displaced by a massively enlarged uterus Vascular/Lymphatic: No significant vascular findings are present. No enlarged abdominal or pelvic lymph nodes. Reproductive: Again noted is massive uterine enlargement from innumerable leiomyomata. Overall uterine dimensions are increased since prior CT on 06/14/2016 with current maximal dimensions of approximately 30.3 x 17.6 x 25 cm for estimated uterine volume of 6.9 L. prior maximal dimensions at comparable levels were approximately 28 x 17.4 x 20 cm. Component of free fluid along the right lateral border of the uterus is slightly smaller. No evidence of adnexal mass. Some of the fibroids continue to demonstrate partial degenerative calcification. Many of the fibroids demonstrate significant contrast enhancement after administration of contrast and some demonstrate areas of central necrosis. The top of the uterus reaches as high as the gallbladder and the uterus exerts significant mass effect on the bladder which is severely compressed. Other: No hernias identified. Musculoskeletal: No acute or significant osseous findings. IMPRESSION: 1. Massively enlarged uterus demonstrating innumerable leiomyomata. Overall uterine dimensions have enlarged since February with current estimated volume of 6.9 L. The uterus exerts significant mass effect on the bladder and likely is also the cause of bilateral hydronephrosis with delayed imaging demonstrating delayed contrast excretion in both renal collecting systems. Correlation suggested with renal function as this could potentially cause renal  insufficiency. 2. Stable evidence of cholelithiasis with partially calcified gallstones. No evidence of cholecystitis or biliary obstruction. Electronically Signed   By: Aletta Edouard M.D.   On: 11/04/2016 14:29     Assessment / Plan:   CARNELIA OSCAR is a 46 y.o. G0 who presents with significantly enlarged fibroid uterus with bilateral hydronephrosis, epigastric pain, hypokalemia, chronic anemia, chronic kidney disease.  1. Planning total abdominal hysterectomy with bilateral salpingectomy and placement and removal of bilateral ureteral stents. This is a large surgery, with possible complications that are significant, and the full risk profile was discussed with the patient today. Skilled surgical assistance will lower the risk significantly, and therefore we will delay her surgery for 48 hours until skilled surgical assistant is available, as her surgery is emergent but not an emergency.  Surgical planning:  - Cardiology consult. Discussed with the cardiologist, and they will see her in the morning. - Potassium repletion. 40 mEq twice a day by mouth. - Chronic anemia: 2 units packed red blood cells, preoperatively.  We'll type and screen 4 units. The possible blood loss and the surgery could be as high as 4 L, and the patient is aware of this. The time of the procedure, I will have lidocaine with epi available in the room, electrocautery at my disposal, and we'll give her lysed unit as needed. - Lovenox prophylactically ordered prior to the OR. - Will follow ERAS protocol for pain management, and plan liposomal bupivicaine for her midline vertical incision. - EKG and troponins now - Consult to social work for management of postop care and no insurance planning.  2. After discussion with the operating room, we will add her on for 36 hours from now, with skilled surgical assistance, blood bank, and full staffing.   Thank you for the opportunity to be involved with this pt's care.

## 2016-11-05 ENCOUNTER — Encounter
Admission: EM | Disposition: A | Discharge status: Home / Self Care | Payer: Self-pay | Source: Home / Self Care | Attending: Obstetrics and Gynecology

## 2016-11-05 ENCOUNTER — Encounter: Payer: Self-pay | Admitting: Anesthesiology

## 2016-11-05 LAB — CBC WITH DIFFERENTIAL/PLATELET
Basophils Absolute: 0.1 10*3/uL (ref 0–0.1)
Basophils Relative: 1 %
EOS ABS: 0.1 10*3/uL (ref 0–0.7)
Eosinophils Relative: 2 %
HCT: 37.5 % (ref 35.0–47.0)
Hemoglobin: 11.8 g/dL — ABNORMAL LOW (ref 12.0–16.0)
LYMPHS ABS: 1.4 10*3/uL (ref 1.0–3.6)
Lymphocytes Relative: 18 %
MCH: 23.2 pg — AB (ref 26.0–34.0)
MCHC: 31.4 g/dL — AB (ref 32.0–36.0)
MCV: 74 fL — ABNORMAL LOW (ref 80.0–100.0)
MONO ABS: 0.4 10*3/uL (ref 0.2–0.9)
MONOS PCT: 6 %
Neutro Abs: 5.6 10*3/uL (ref 1.4–6.5)
Neutrophils Relative %: 73 %
Platelets: 363 10*3/uL (ref 150–440)
RBC: 5.07 MIL/uL (ref 3.80–5.20)
RDW: 21.6 % — ABNORMAL HIGH (ref 11.5–14.5)
WBC: 7.7 10*3/uL (ref 3.6–11.0)

## 2016-11-05 LAB — TROPONIN I: Troponin I: <0.03 ng/mL (ref <0.03)

## 2016-11-05 LAB — COMPREHENSIVE METABOLIC PANEL
ALT: 70 U/L — ABNORMAL HIGH (ref 14–54)
ANION GAP: 8 (ref 5–15)
AST: 101 U/L — ABNORMAL HIGH (ref 15–41)
Albumin: 2.8 g/dL — ABNORMAL LOW (ref 3.5–5.0)
Alkaline Phosphatase: 84 U/L (ref 38–126)
BUN: 16 mg/dL (ref 6–20)
CALCIUM: 10 mg/dL (ref 8.9–10.3)
CHLORIDE: 104 mmol/L (ref 101–111)
CO2: 25 mmol/L (ref 22–32)
Creatinine, Ser: 1.68 mg/dL — ABNORMAL HIGH (ref 0.44–1.00)
GFR, EST AFRICAN AMERICAN: 41 mL/min — AB (ref >60)
GFR, EST NON AFRICAN AMERICAN: 36 mL/min — AB (ref >60)
Glucose, Bld: 110 mg/dL — ABNORMAL HIGH (ref 65–99)
Potassium: 3.8 mmol/L (ref 3.5–5.1)
SODIUM: 137 mmol/L (ref 135–145)
Total Bilirubin: 1.3 mg/dL — ABNORMAL HIGH (ref 0.3–1.2)
Total Protein: 7.7 g/dL (ref 6.5–8.1)

## 2016-11-05 LAB — RENAL FUNCTION PANEL
Albumin: 2.9 g/dL — ABNORMAL LOW (ref 3.5–5.0)
Anion gap: 9 (ref 5–15)
BUN: 16 mg/dL (ref 6–20)
CHLORIDE: 104 mmol/L (ref 101–111)
CO2: 24 mmol/L (ref 22–32)
CREATININE: 1.67 mg/dL — AB (ref 0.44–1.00)
Calcium: 10.1 mg/dL (ref 8.9–10.3)
GFR calc Af Amer: 42 mL/min — ABNORMAL LOW (ref >60)
GFR, EST NON AFRICAN AMERICAN: 36 mL/min — AB (ref >60)
GLUCOSE: 96 mg/dL (ref 65–99)
Phosphorus: 2.3 mg/dL — ABNORMAL LOW (ref 2.5–4.6)
Potassium: 3.5 mmol/L (ref 3.5–5.1)
Sodium: 137 mmol/L (ref 135–145)

## 2016-11-05 SURGERY — HYSTERECTOMY, ABDOMINAL
Anesthesia: General | Laterality: Bilateral

## 2016-11-05 MED ORDER — SODIUM CHLORIDE 0.9 % IV SOLN
INTRAVENOUS | Status: DC
  Administered 2016-11-05 – 2016-11-06 (×2): via INTRAVENOUS

## 2016-11-05 MED ORDER — PANTOPRAZOLE SODIUM 40 MG PO TBEC
40.0000 mg | DELAYED_RELEASE_TABLET | Freq: Every day | ORAL | Status: DC
  Administered 2016-11-05 – 2016-11-10 (×5): 40 mg via ORAL
  Filled 2016-11-05 (×6): qty 1

## 2016-11-05 NOTE — Consult Note (Signed)
History and Physical    [name] HQI:[id number] DOB: [birth year] DOA: 11/04/2016  Referring physician: Dr. Leafy Ro PCP: [facility], [place]  Specialists: none  Chief Complaint: HTN and CKD  HPI: [name] is a 46 y.o. female has a past medical history significant for HTN and CKD followed by Dr. Holley Raring in the past. Was in [facility] 3 months ago with CP. Cardiac cath normal. Now with uterine fibroids requiring surgical removal. Denies Cp or SOB. Does c/o constipation but is urinating normally. Renal fxn stable. No fever.  Review of Systems: The patient denies anorexia, fever, weight loss,, vision loss, decreased hearing, hoarseness, chest pain, syncope, dyspnea on exertion, peripheral edema, balance deficits, hemoptysis, melena, hematochezia, severe indigestion/heartburn, hematuria, incontinence, genital sores, muscle weakness, suspicious skin lesions, transient blindness, difficulty walking, depression, unusual weight change, abnormal bleeding, enlarged lymph nodes, angioedema, and breast masses.   Past Medical History:  Diagnosis Date  . Anemia   . Coronary artery disease   . Hypertension   . MI (myocardial infarction) [facility])    Past Surgical History:  Procedure Laterality Date  . LEFT HEART CATH AND CORONARY ANGIOGRAPHY Right 08/01/2016   Procedure: Left Heart Cath and Coronary Angiography;  Surgeon: Dionisio David, MD;  Location: [place] [facility];  Service: Cardiovascular;  Laterality: Right;   Social History:  reports that she has never smoked. She has never used smokeless tobacco. She reports that she does not drink alcohol or use drugs.  Allergies  Allergen Reactions  . Ace Inhibitors Cough  . Latex Swelling    Family History  Problem Relation Age of Onset  . CAD Father     Prior to Admission medications   Medication Sig Start Date End Date Taking? Authorizing Provider  aspirin 325 MG tablet Take 1 tablet (325 mg total) by mouth daily. Patient taking  differently: Take 81 mg by mouth 2 (two) times daily.  08/03/16  Yes Demetrios Loll, MD  furosemide (LASIX) 20 MG tablet Take 20 mg by mouth daily.   Yes [provider]  hydrALAZINE (APRESOLINE) 100 MG tablet Take 1 tablet (100 mg total) by mouth every 8 (eight) hours. 08/02/16  Yes Demetrios Loll, MD  metoprolol (LOPRESSOR) 100 MG tablet Take 1 tablet (100 mg total) by mouth 2 (two) times daily. 09/05/16  Yes Earleen Newport, MD  norethindrone-ethinyl estradiol 1/35 ([place] 1/35, 28,) tablet Take 1 tablet by mouth daily. 1 po tid x 3 days then 1 po bid x 2 days then qd . Dispose of the last 7 tabs and restart new pack Patient taking differently: Take 1 tablet by mouth daily.  09/14/15  Yes Schermerhorn, Gwen Her, MD  atorvastatin (LIPITOR) 40 MG tablet Take 1 tablet (40 mg total) by mouth daily at 6 PM. Patient not taking: Reported on 09/05/2016 08/02/16   Demetrios Loll, MD  docusate sodium (COLACE) 100 MG capsule Take 1 capsule (100 mg total) by mouth 2 (two) times daily. Patient not taking: Reported on 07/29/2016 09/14/15   Schermerhorn, Gwen Her, MD  isosorbide mononitrate (IMDUR) 30 MG 24 hr tablet Take 1 tablet (30 mg total) by mouth daily. Patient not taking: Reported on 09/05/2016 08/03/16   Demetrios Loll, MD  ondansetron (ZOFRAN) 4 MG tablet Take 1 tablet (4 mg total) by mouth every 6 (six) hours as needed for nausea. Patient not taking: Reported on 09/05/2016 09/14/15   Schermerhorn, Gwen Her, MD   Physical Exam: Vitals:   11/05/16 1324 11/05/16 4010 11/05/16 2725 11/05/16  1138  BP: (!) 163/80 (!) 189/103 (!) 161/85 (!) 155/77  Pulse: 85 77 96 76  Resp: 19 18 18 18   Temp: 98.4 F (36.9 C) 97.8 F (36.6 C)  98.4 F (36.9 C)  TempSrc: Oral Oral  Oral  SpO2: 99% 100% 100% 100%  Weight:      Height:         General:  No apparent distress, WDWN, /AT  Eyes: PERRL, EOMI, no scleral icterus, conjunctiva clear  ENT: moist oropharynx without exudate, TM's benign, dentition poor  Neck:  supple, no lymphadenopathy. No bruits or thyromegaly  Cardiovascular: regular rate without MRG; 2+ peripheral pulses, no JVD, trace peripheral edema  Respiratory: CTA biL, good air movement without wheezing, rhonchi or crackled. Respiratory effort normal  Abdomen: soft, non tender to palpation, positive bowel sounds, no guarding, no rebound  Skin: no rashes or lesions  Musculoskeletal: normal bulk and tone, no joint swelling  Psychiatric: normal mood and affect, A&OX3  Neurologic: CN 2-12 grossly intact, Motor strength 5/5 in all 4 groups with symmetric DTR's and non-focal sensory exam  Labs on Admission:  Basic Metabolic Panel:  Recent Labs Lab 11/04/16 1132  NA 136  K 2.9*  CL 100*  CO2 26  GLUCOSE 92  BUN 20  CREATININE 1.76*  CALCIUM 10.1   Liver Function Tests:  Recent Labs Lab 11/04/16 1132  AST 61*  ALT 60*  ALKPHOS 82  BILITOT 0.5  PROT 8.7*  ALBUMIN 3.2*    Recent Labs Lab 11/04/16 1132  LIPASE 33   No results for input(s): AMMONIA in the last 168 hours. CBC:  Recent Labs Lab 11/04/16 1132  WBC 7.7  HGB 10.5*  HCT 34.1*  MCV 73.0*  PLT 378   Cardiac Enzymes:  Recent Labs Lab 11/04/16 1133 11/04/16 2036 11/05/16 0327  TROPONINI 0.04* <0.03 <0.03    BNP (last 3 results) No results for input(s): BNP in the last 8760 hours.  ProBNP (last 3 results) No results for input(s): PROBNP in the last 8760 hours.  CBG: No results for input(s): GLUCAP in the last 168 hours.  Radiological Exams on Admission: Dg Chest 1 View  Result Date: 11/04/2016 CLINICAL DATA:  Non smoker, hx of MI (4-18), now preop for hysterectomy EXAM: CHEST 1 VIEW COMPARISON:  09/05/2016 FINDINGS: Midline trachea. Mild cardiomegaly, accentuated by AP portable technique. No pleural effusion or pneumothorax. No congestive failure. Clear lungs. IMPRESSION: Cardiomegaly without congestive failure. Electronically Signed   By: Abigail Miyamoto M.D.   On: 11/04/2016 15:23   Ct  Abdomen Pelvis W Contrast  Result Date: 11/04/2016 CLINICAL DATA:  Abdominal pain and distention. Vaginal bleeding and history of uterine fibroids. EXAM: CT ABDOMEN AND PELVIS WITH CONTRAST TECHNIQUE: Multidetector CT imaging of the abdomen and pelvis was performed using the standard protocol following bolus administration of intravenous contrast. CONTRAST:  89mL ISOVUE-300 IOPAMIDOL (ISOVUE-300) INJECTION 61% COMPARISON:  CT of the abdomen and pelvis on 06/14/2016 and pelvic ultrasound on 09/14/2015. FINDINGS: Lower chest: No acute abnormality. Hepatobiliary: Stable rim calcified gallstones without evidence of gallbladder distention or inflammation. No biliary dilatation identified. The liver is unremarkable. Pancreas: Unremarkable. No pancreatic ductal dilatation or surrounding inflammatory changes. Spleen: Normal in size without focal abnormality. Adrenals/Urinary Tract: Adrenal glands are unremarkable. Both kidneys demonstrate mild to moderate hydronephrosis and there is evidence of delayed excretion of contrast in the collecting systems bilaterally on delayed imaging. This is felt to be secondary to mass effect on the bladder and distal ureters by  a massively enlarged uterus. The bladder is compressed from above by the enlarged uterus. No evidence of urinary tract calculi. Bilateral renal cysts are stable in appearance compared to the prior CT. Stomach/Bowel: No evidence of bowel obstruction, inflammation or free air. Pelvic bowel loops are displaced by a massively enlarged uterus Vascular/Lymphatic: No significant vascular findings are present. No enlarged abdominal or pelvic lymph nodes. Reproductive: Again noted is massive uterine enlargement from innumerable leiomyomata. Overall uterine dimensions are increased since prior CT on 06/14/2016 with current maximal dimensions of approximately 30.3 x 17.6 x 25 cm for estimated uterine volume of 6.9 L. prior maximal dimensions at comparable levels were  approximately 28 x 17.4 x 20 cm. Component of free fluid along the right lateral border of the uterus is slightly smaller. No evidence of adnexal mass. Some of the fibroids continue to demonstrate partial degenerative calcification. Many of the fibroids demonstrate significant contrast enhancement after administration of contrast and some demonstrate areas of central necrosis. The top of the uterus reaches as high as the gallbladder and the uterus exerts significant mass effect on the bladder which is severely compressed. Other: No hernias identified. Musculoskeletal: No acute or significant osseous findings. IMPRESSION: 1. Massively enlarged uterus demonstrating innumerable leiomyomata. Overall uterine dimensions have enlarged since February with current estimated volume of 6.9 L. The uterus exerts significant mass effect on the bladder and likely is also the cause of bilateral hydronephrosis with delayed imaging demonstrating delayed contrast excretion in both renal collecting systems. Correlation suggested with renal function as this could potentially cause renal insufficiency. 2. Stable evidence of cholelithiasis with partially calcified gallstones. No evidence of cholecystitis or biliary obstruction. Electronically Signed   By: Aletta Edouard M.D.   On: 11/04/2016 14:29    EKG: Independently reviewed.  Assessment/Plan Active Problems:   Fibroid uterus   Agree with current care. Nephrology and Cardiology consults pending. BP improving. Repeat renal panel now and and again in AM. Proceed with surgery as scheduled. Will follow. Call with questions.  Diet: per GYN Fluids: per GYN DVT Prophylaxis: per GYN  Code Status: FULL  Family Communication: none  Disposition Plan: per GYN  Time spent: 50 min

## 2016-11-05 NOTE — Anesthesia Preprocedure Evaluation (Addendum)
Anesthesia Evaluation  Patient identified by MRN, date of birth, ID band Patient awake    Reviewed: Allergy & Precautions, NPO status , Patient's Chart, lab work & pertinent test results, reviewed documented beta blocker date and time   Airway Mallampati: III  TM Distance: <3 FB     Dental  (+) Poor Dentition, Loose, Dental Advisory Given,    Pulmonary neg pulmonary ROS,           Cardiovascular hypertension, Pt. on medications and Pt. on home beta blockers + CAD and + Past MI       Neuro/Psych negative neurological ROS  negative psych ROS   GI/Hepatic negative GI ROS, Neg liver ROS,   Endo/Other  Morbid obesity  Renal/GU Renal InsufficiencyRenal diseasenegative Renal ROS  Female GU complaint     Musculoskeletal negative musculoskeletal ROS (+)   Abdominal   Peds negative pediatric ROS (+)  Hematology  (+) anemia ,   Anesthesia Other Findings Past Medical History: No date: Anemia No date: Coronary artery disease No date: Hypertension No date: MI (myocardial infarction) ([place])   Echo with normal coronaries per cardiology and EF of 65%.  Low risk per cardiology  Reproductive/Obstetrics                        Anesthesia Physical Anesthesia Plan  ASA: III  Anesthesia Plan: General   Post-op Pain Management:    Induction: Intravenous  PONV Risk Score and Plan:   Airway Management Planned: Oral ETT  Additional Equipment:   Intra-op Plan:   Post-operative Plan: Extubation in OR  Informed Consent: I have reviewed the patients History and Physical, chart, labs and discussed the procedure including the risks, benefits and alternatives for the proposed anesthesia with the patient or authorized representative who has indicated his/her understanding and acceptance.   Dental advisory given  Plan Discussed with: CRNA and Surgeon  Anesthesia Plan Comments: (Note very large fibroid uterus  with potential for increased blood loss.  Patient also has a very loose upper front tooth which the patient understands may come out.  Patient understands all risks and is agreeable to proceed with the surgery.)       Anesthesia Quick Evaluation

## 2016-11-05 NOTE — Clinical Social Work Note (Signed)
CSW received consult for insurance issues/aftercare planning post-op. CSW will assess when able.  Santiago Bumpers, MSW, Latanya Presser [phone]

## 2016-11-05 NOTE — Progress Notes (Signed)
Obstetric and Gynecology  Subjective  [name] is a 46 y.o. female who presented on 11/04/2016 for pain, bleeding and mass sx. Found to have anemia, hypokalemia to 2.9 and a massively enlarged fibroid uterus to the upper quadrants with elevated liver enzymes, bilateral hydronephrosis, renal insufficiency with elevated creatinine higher than at last check, but lower than her highest. Her hx includes cardiac sx 3 months ago with possible N-STEMI with a normal echo and cardiac cath at that time, and normal troponins now. Her BP has been very high since admission and is controlled with iv and now po hydralazine. She takes metoprolol 100mg  BID po at home and that was ordered here as well.  Workup for fibroids 1 year ago with normal EMBx and normal pap smear.  She has a Headache today which she relates to not eating, and the hospital food is not interesting to her.  Urine output is good. No nausea. +constipation that bothers her a lot.  Objective   Vitals:   11/05/16 0859 11/05/16 1138  BP: (!) 161/85 (!) 155/77  Pulse: 96 76  Resp: 18 18  Temp:  98.4 F (36.9 C)     Intake/Output Summary (Last 24 hours) at 11/05/16 1220 Last data filed at 11/05/16 0945  Gross per 24 hour  Intake              882 ml  Output             1600 ml  Net             -718 ml    General: NAD Cardiovascular: RRR, no murmurs Pulmonary: CTAB Abdomen: Enlarged uterus, but non-tender, +BS, no guarding. Extremities: No erythema or cords, no calf tenderness, +warmth with normal peripheral pulses. Trace edema  Labs: Results for orders placed or performed during the hospital encounter of 11/04/16 (from the past 24 hour(s))  Type and screen [place]     Status: None (Preliminary result)   Collection Time: 11/04/16  3:00 PM  Result Value Ref Range   ABO/RH(D) O POS    Antibody Screen NEG    Sample Expiration 11/07/2016    Unit Number [id number]    Blood Component Type RBC, LR IRR     Unit division 00    Status of Unit ISSUED    Transfusion Status OK TO TRANSFUSE    Crossmatch Result Compatible    Unit Number [id number]    Blood Component Type RBC, LR IRR    Unit division 00    Status of Unit ISSUED,FINAL    Transfusion Status OK TO TRANSFUSE    Crossmatch Result Compatible   Prepare RBC     Status: None   Collection Time: 11/04/16  5:20 PM  Result Value Ref Range   Order Confirmation ORDER PROCESSED BY BLOOD BANK   Troponin I     Status: None   Collection Time: 11/04/16  8:36 PM  Result Value Ref Range   Troponin I <0.03 <0.03 ng/mL  Troponin I (q 6hr x 3)     Status: None   Collection Time: 11/05/16  3:27 AM  Result Value Ref Range   Troponin I <0.03 <0.03 ng/mL    Cultures: No results found for this or any previous visit.  Imaging: Dg Chest 1 View  Result Date: 11/04/2016 CLINICAL DATA:  Non smoker, hx of MI (4-18), now preop for hysterectomy EXAM: CHEST 1 VIEW COMPARISON:  09/05/2016 FINDINGS: Midline trachea. Mild cardiomegaly, accentuated by AP  portable technique. No pleural effusion or pneumothorax. No congestive failure. Clear lungs. IMPRESSION: Cardiomegaly without congestive failure. Electronically Signed   By: Abigail Miyamoto M.D.   On: 11/04/2016 15:23   Ct Abdomen Pelvis W Contrast  Result Date: 11/04/2016 CLINICAL DATA:  Abdominal pain and distention. Vaginal bleeding and history of uterine fibroids. EXAM: CT ABDOMEN AND PELVIS WITH CONTRAST TECHNIQUE: Multidetector CT imaging of the abdomen and pelvis was performed using the standard protocol following bolus administration of intravenous contrast. CONTRAST:  61mL ISOVUE-300 IOPAMIDOL (ISOVUE-300) INJECTION 61% COMPARISON:  CT of the abdomen and pelvis on 06/14/2016 and pelvic ultrasound on 09/14/2015. FINDINGS: Lower chest: No acute abnormality. Hepatobiliary: Stable rim calcified gallstones without evidence of gallbladder distention or inflammation. No biliary dilatation identified. The liver is  unremarkable. Pancreas: Unremarkable. No pancreatic ductal dilatation or surrounding inflammatory changes. Spleen: Normal in size without focal abnormality. Adrenals/Urinary Tract: Adrenal glands are unremarkable. Both kidneys demonstrate mild to moderate hydronephrosis and there is evidence of delayed excretion of contrast in the collecting systems bilaterally on delayed imaging. This is felt to be secondary to mass effect on the bladder and distal ureters by a massively enlarged uterus. The bladder is compressed from above by the enlarged uterus. No evidence of urinary tract calculi. Bilateral renal cysts are stable in appearance compared to the prior CT. Stomach/Bowel: No evidence of bowel obstruction, inflammation or free air. Pelvic bowel loops are displaced by a massively enlarged uterus Vascular/Lymphatic: No significant vascular findings are present. No enlarged abdominal or pelvic lymph nodes. Reproductive: Again noted is massive uterine enlargement from innumerable leiomyomata. Overall uterine dimensions are increased since prior CT on 06/14/2016 with current maximal dimensions of approximately 30.3 x 17.6 x 25 cm for estimated uterine volume of 6.9 L. prior maximal dimensions at comparable levels were approximately 28 x 17.4 x 20 cm. Component of free fluid along the right lateral border of the uterus is slightly smaller. No evidence of adnexal mass. Some of the fibroids continue to demonstrate partial degenerative calcification. Many of the fibroids demonstrate significant contrast enhancement after administration of contrast and some demonstrate areas of central necrosis. The top of the uterus reaches as high as the gallbladder and the uterus exerts significant mass effect on the bladder which is severely compressed. Other: No hernias identified. Musculoskeletal: No acute or significant osseous findings. IMPRESSION: 1. Massively enlarged uterus demonstrating innumerable leiomyomata. Overall uterine  dimensions have enlarged since February with current estimated volume of 6.9 L. The uterus exerts significant mass effect on the bladder and likely is also the cause of bilateral hydronephrosis with delayed imaging demonstrating delayed contrast excretion in both renal collecting systems. Correlation suggested with renal function as this could potentially cause renal insufficiency. 2. Stable evidence of cholelithiasis with partially calcified gallstones. No evidence of cholecystitis or biliary obstruction. Electronically Signed   By: Aletta Edouard M.D.   On: 11/04/2016 14:29     Assessment   46 y.o. with enlarged fibroid uterus filling entire peritoneal cavity.Hospital Day: 2   Plan   1. Cardiology consult: appreciate them seeing her today. Will f/u their recs 2. Hospitalist consult: appreciate their recs today. 3. Planned surgery tomorrow: On books for 9am. - now s/p 2u pRBCs, will recheck CBC at next blood draw - 91mEq po potassium cl  - will place and remove stents in OR - npo after midnight - pain meds PRN  4. HTN: will follow hospitalist recs, continue beta blocker and hydral prn 5. Renal insufficiency: f/u consult recs

## 2016-11-05 NOTE — Consult Note (Signed)
[name] is a 46 y.o. female  [id number]  Primary Cardiologist: Dr. Neoma Laming Reason for Consultation: History of elevated troponin, resistant hypertension  HPI: 46yo black female with history of hypertension, CKD, and severely enlarged uterine fibroids presents to ER with persistent vaginal bleeding. Will need surgery to remove fibroids and needs cardiac clearance.   Review of Systems: No chest pain or shortness of breath. Intermittent mild-moderate abdominal pain currently well controlled. Pt reports she has no appetite and has not eaten in a day or two.   Past Medical History:  Diagnosis Date  . Anemia   . Coronary artery disease   . Hypertension   . MI (myocardial infarction) ([place])     Medications Prior to Admission  Medication Sig Dispense Refill  . aspirin 325 MG tablet Take 1 tablet (325 mg total) by mouth daily. (Patient taking differently: Take 81 mg by mouth 2 (two) times daily. ) 30 tablet 2  . furosemide (LASIX) 20 MG tablet Take 20 mg by mouth daily.    . hydrALAZINE (APRESOLINE) 100 MG tablet Take 1 tablet (100 mg total) by mouth every 8 (eight) hours. 90 tablet 2  . metoprolol (LOPRESSOR) 100 MG tablet Take 1 tablet (100 mg total) by mouth 2 (two) times daily. 30 tablet 2  . norethindrone-ethinyl estradiol 1/35 ([place] 1/35, 28,) tablet Take 1 tablet by mouth daily. 1 po tid x 3 days then 1 po bid x 2 days then qd . Dispose of the last 7 tabs and restart new pack (Patient taking differently: Take 1 tablet by mouth daily. ) 2 Package 11  . atorvastatin (LIPITOR) 40 MG tablet Take 1 tablet (40 mg total) by mouth daily at 6 PM. (Patient not taking: Reported on 09/05/2016) 30 tablet 2  . docusate sodium (COLACE) 100 MG capsule Take 1 capsule (100 mg total) by mouth 2 (two) times daily. (Patient not taking: Reported on 07/29/2016) 60 capsule 0  . isosorbide mononitrate (IMDUR) 30 MG 24 hr tablet Take 1 tablet (30 mg total) by mouth daily. (Patient not taking:  Reported on 09/05/2016) 30 tablet 2  . ondansetron (ZOFRAN) 4 MG tablet Take 1 tablet (4 mg total) by mouth every 6 (six) hours as needed for nausea. (Patient not taking: Reported on 09/05/2016) 30 tablet 0     . atorvastatin  40 mg Oral q1800  . docusate sodium  100 mg Oral BID  . enoxaparin (LOVENOX) injection  30 mg Subcutaneous Q24H  . hydrALAZINE  100 mg Oral Q8H  . metoprolol tartrate  100 mg Oral BID  . potassium chloride  40 mEq Oral BID  . prenatal multivitamin  1 tablet Oral Q1200    Infusions: . sodium chloride 20 mL/hr at 11/05/16 0240  . tranexamic acid      Allergies  Allergen Reactions  . Ace Inhibitors Cough  . Latex Swelling    Social History   Social History  . Marital status: Single    Spouse name: N/A  . Number of children: N/A  . Years of education: N/A   Occupational History  . Not on file.   Social History Main Topics  . Smoking status: Never Smoker  . Smokeless tobacco: Never Used  . Alcohol use No  . Drug use: No  . Sexual activity: Not on file   Other Topics Concern  . Not on file   Social History Narrative  . No narrative on file    Family History  Problem Relation Age  of Onset  . CAD Father     PHYSICAL EXAM: Vitals:   11/05/16 0859 11/05/16 1138  BP: (!) 161/85 (!) 155/77  Pulse: 96 76  Resp: 18 18  Temp:  98.4 F (36.9 C)     Intake/Output Summary (Last 24 hours) at 11/05/16 1304 Last data filed at 11/05/16 0945  Gross per 24 hour  Intake              882 ml  Output             1600 ml  Net             -718 ml    General:  Well appearing. No respiratory difficulty HEENT: normal Neck: supple. no JVD. Carotids 2+ bilat; no bruits. No lymphadenopathy or thryomegaly appreciated. Cor: PMI nondisplaced. Regular rate & rhythm. No rubs, gallops or murmurs. Lungs: clear Abdomen:Large, firm, abdominal mass palpitated with mild tenderness.  Extremities: no cyanosis, clubbing, rash, edema Neuro: alert & oriented x 3,  cranial nerves grossly intact. moves all 4 extremities w/o difficulty. Affect pleasant.  ECG: NSR 66bpm possible LVH  Results for orders placed or performed during the hospital encounter of 11/04/16 (from the past 24 hour(s))  Type and screen [place]     Status: None (Preliminary result)   Collection Time: 11/04/16  3:00 PM  Result Value Ref Range   ABO/RH(D) O POS    Antibody Screen NEG    Sample Expiration 11/07/2016    Unit Number [id number]    Blood Component Type RBC, LR IRR    Unit division 00    Status of Unit ISSUED    Transfusion Status OK TO TRANSFUSE    Crossmatch Result Compatible    Unit Number [id number]    Blood Component Type RBC, LR IRR    Unit division 00    Status of Unit ISSUED,FINAL    Transfusion Status OK TO TRANSFUSE    Crossmatch Result Compatible   Prepare RBC     Status: None   Collection Time: 11/04/16  5:20 PM  Result Value Ref Range   Order Confirmation ORDER PROCESSED BY BLOOD BANK   Troponin I     Status: None   Collection Time: 11/04/16  8:36 PM  Result Value Ref Range   Troponin I <0.03 <0.03 ng/mL  Troponin I (q 6hr x 3)     Status: None   Collection Time: 11/05/16  3:27 AM  Result Value Ref Range   Troponin I <0.03 <0.03 ng/mL   Dg Chest 1 View  Result Date: 11/04/2016 CLINICAL DATA:  Non smoker, hx of MI (4-18), now preop for hysterectomy EXAM: CHEST 1 VIEW COMPARISON:  09/05/2016 FINDINGS: Midline trachea. Mild cardiomegaly, accentuated by AP portable technique. No pleural effusion or pneumothorax. No congestive failure. Clear lungs. IMPRESSION: Cardiomegaly without congestive failure. Electronically Signed   By: Abigail Miyamoto M.D.   On: 11/04/2016 15:23   Ct Abdomen Pelvis W Contrast  Result Date: 11/04/2016 CLINICAL DATA:  Abdominal pain and distention. Vaginal bleeding and history of uterine fibroids. EXAM: CT ABDOMEN AND PELVIS WITH CONTRAST TECHNIQUE: Multidetector CT imaging of the abdomen and pelvis was  performed using the standard protocol following bolus administration of intravenous contrast. CONTRAST:  14mL ISOVUE-300 IOPAMIDOL (ISOVUE-300) INJECTION 61% COMPARISON:  CT of the abdomen and pelvis on 06/14/2016 and pelvic ultrasound on 09/14/2015. FINDINGS: Lower chest: No acute abnormality. Hepatobiliary: Stable rim calcified gallstones without evidence of gallbladder distention or inflammation. No  biliary dilatation identified. The liver is unremarkable. Pancreas: Unremarkable. No pancreatic ductal dilatation or surrounding inflammatory changes. Spleen: Normal in size without focal abnormality. Adrenals/Urinary Tract: Adrenal glands are unremarkable. Both kidneys demonstrate mild to moderate hydronephrosis and there is evidence of delayed excretion of contrast in the collecting systems bilaterally on delayed imaging. This is felt to be secondary to mass effect on the bladder and distal ureters by a massively enlarged uterus. The bladder is compressed from above by the enlarged uterus. No evidence of urinary tract calculi. Bilateral renal cysts are stable in appearance compared to the prior CT. Stomach/Bowel: No evidence of bowel obstruction, inflammation or free air. Pelvic bowel loops are displaced by a massively enlarged uterus Vascular/Lymphatic: No significant vascular findings are present. No enlarged abdominal or pelvic lymph nodes. Reproductive: Again noted is massive uterine enlargement from innumerable leiomyomata. Overall uterine dimensions are increased since prior CT on 06/14/2016 with current maximal dimensions of approximately 30.3 x 17.6 x 25 cm for estimated uterine volume of 6.9 L. prior maximal dimensions at comparable levels were approximately 28 x 17.4 x 20 cm. Component of free fluid along the right lateral border of the uterus is slightly smaller. No evidence of adnexal mass. Some of the fibroids continue to demonstrate partial degenerative calcification. Many of the fibroids demonstrate  significant contrast enhancement after administration of contrast and some demonstrate areas of central necrosis. The top of the uterus reaches as high as the gallbladder and the uterus exerts significant mass effect on the bladder which is severely compressed. Other: No hernias identified. Musculoskeletal: No acute or significant osseous findings. IMPRESSION: 1. Massively enlarged uterus demonstrating innumerable leiomyomata. Overall uterine dimensions have enlarged since February with current estimated volume of 6.9 L. The uterus exerts significant mass effect on the bladder and likely is also the cause of bilateral hydronephrosis with delayed imaging demonstrating delayed contrast excretion in both renal collecting systems. Correlation suggested with renal function as this could potentially cause renal insufficiency. 2. Stable evidence of cholelithiasis with partially calcified gallstones. No evidence of cholecystitis or biliary obstruction. Electronically Signed   By: Aletta Edouard M.D.   On: 11/04/2016 14:29     ASSESSMENT AND PLAN: Pt has had a cardiac cath May 2018 with normal coronaries. Echo done at that time showed normal left ventricular ejection fraction of 65% and moderate mitral regurgitation, otherwise echo findings were unremarkable. EKG done upon admission showed pt was in normal sinus rhythm with no acute changes.  Pt has a history of severe hypertension noted in our office of 220/140 in May 2018. Blood pressure may be related to renal insufficiency due to uterine mass compressing renal arteries.  Continue hydralazine, metoprolol tartrate, potassium replacement.  May add Norvasc 10mg  if needed for blood pressure control as pressures seem to be labile, however I am hesitant to add more medication prior to surgery.   I have discussed Ms. Traynham's case and the plan of care with Dr. Humphrey Rolls and we are in agreement that the pt is low cardiac risk and advise proceeding with surgery.  Jake Bathe, NP-C

## 2016-11-06 ENCOUNTER — Ambulatory Visit: Admit: 2016-11-06 | Payer: BLUE CROSS/BLUE SHIELD | Admitting: Obstetrics and Gynecology

## 2016-11-06 ENCOUNTER — Inpatient Hospital Stay: Payer: Self-pay | Admitting: Anesthesiology

## 2016-11-06 ENCOUNTER — Encounter: Payer: Self-pay | Admitting: Anesthesiology

## 2016-11-06 ENCOUNTER — Encounter
Admission: EM | Disposition: A | Discharge status: Home / Self Care | Payer: Self-pay | Source: Home / Self Care | Attending: Obstetrics and Gynecology

## 2016-11-06 HISTORY — PX: HYSTERECTOMY ABDOMINAL WITH SALPINGECTOMY: SHX6725

## 2016-11-06 HISTORY — PX: CYSTOSCOPY: SHX5120

## 2016-11-06 LAB — PREPARE RBC (CROSSMATCH)

## 2016-11-06 LAB — PREGNANCY, URINE: Preg Test, Ur: NEGATIVE

## 2016-11-06 LAB — POCT PREGNANCY, URINE: PREG TEST UR: NEGATIVE

## 2016-11-06 SURGERY — HYSTERECTOMY, TOTAL, ABDOMINAL, WITH SALPINGECTOMY
Anesthesia: General | Site: Uterus | Laterality: Left | Wound class: Clean Contaminated

## 2016-11-06 MED ORDER — SUGAMMADEX SODIUM 500 MG/5ML IV SOLN
INTRAVENOUS | Status: DC | PRN
  Administered 2016-11-06: 250 mg via INTRAVENOUS

## 2016-11-06 MED ORDER — IBUPROFEN 600 MG PO TABS: 600.0000 mg | ORAL_TABLET | Freq: Four times a day (QID) | ORAL | Status: DC | PRN

## 2016-11-06 MED ORDER — DEXAMETHASONE SODIUM PHOSPHATE 10 MG/ML IJ SOLN
INTRAMUSCULAR | Status: DC | PRN
  Administered 2016-11-06: 5 mg via INTRAVENOUS

## 2016-11-06 MED ORDER — PROPOFOL 10 MG/ML IV BOLUS
INTRAVENOUS | Status: DC | PRN
  Administered 2016-11-06: 150 mg via INTRAVENOUS

## 2016-11-06 MED ORDER — ONDANSETRON HCL 4 MG/2ML IJ SOLN
INTRAMUSCULAR | Status: AC
  Filled 2016-11-06: qty 2

## 2016-11-06 MED ORDER — FENTANYL CITRATE (PF) 100 MCG/2ML IJ SOLN
25.0000 ug | INTRAMUSCULAR | Status: AC | PRN
  Administered 2016-11-06 (×6): 25 ug via INTRAVENOUS

## 2016-11-06 MED ORDER — SODIUM CHLORIDE 0.9 % IV BOLUS (SEPSIS): 500.0000 mL | Freq: Once | INTRAVENOUS | Status: DC

## 2016-11-06 MED ORDER — FLUORESCEIN SODIUM 10 % IV SOLN
INTRAVENOUS | Status: AC
Start: 2016-11-06 — End: ?
  Filled 2016-11-06: qty 5

## 2016-11-06 MED ORDER — ROCURONIUM BROMIDE 100 MG/10ML IV SOLN
INTRAVENOUS | Status: DC | PRN
  Administered 2016-11-06: 10 mg via INTRAVENOUS
  Administered 2016-11-06: 50 mg via INTRAVENOUS
  Administered 2016-11-06 (×2): 10 mg via INTRAVENOUS
  Administered 2016-11-06: 30 mg via INTRAVENOUS

## 2016-11-06 MED ORDER — HYDROMORPHONE 1 MG/ML IV SOLN
INTRAVENOUS | Status: DC
  Administered 2016-11-06: 25 mg via INTRAVENOUS

## 2016-11-06 MED ORDER — SUCCINYLCHOLINE CHLORIDE 20 MG/ML IJ SOLN
INTRAMUSCULAR | Status: DC | PRN
  Administered 2016-11-06: 120 mg via INTRAVENOUS

## 2016-11-06 MED ORDER — LIDOCAINE-EPINEPHRINE 1 %-1:100000 IJ SOLN
INTRAMUSCULAR | Status: DC | PRN
  Administered 2016-11-06: 30 mL

## 2016-11-06 MED ORDER — MIDAZOLAM HCL 2 MG/2ML IJ SOLN
INTRAMUSCULAR | Status: AC
  Filled 2016-11-06: qty 2

## 2016-11-06 MED ORDER — DIPHENHYDRAMINE HCL 12.5 MG/5ML PO ELIX
12.5000 mg | ORAL_SOLUTION | Freq: Four times a day (QID) | ORAL | Status: DC | PRN
  Filled 2016-11-06: qty 5

## 2016-11-06 MED ORDER — MIDAZOLAM HCL 2 MG/2ML IJ SOLN
INTRAMUSCULAR | Status: DC | PRN
  Administered 2016-11-06: 2 mg via INTRAVENOUS

## 2016-11-06 MED ORDER — ENOXAPARIN SODIUM 40 MG/0.4ML ~~LOC~~ SOLN
40.0000 mg | Freq: Two times a day (BID) | SUBCUTANEOUS | Status: DC
  Administered 2016-11-07 – 2016-11-10 (×6): 40 mg via SUBCUTANEOUS
  Filled 2016-11-06 (×6): qty 0.4

## 2016-11-06 MED ORDER — LIDOCAINE HCL (PF) 2 % IJ SOLN
INTRAMUSCULAR | Status: AC
  Filled 2016-11-06: qty 2

## 2016-11-06 MED ORDER — ONDANSETRON HCL 4 MG/2ML IJ SOLN
INTRAMUSCULAR | Status: DC | PRN
  Administered 2016-11-06: 4 mg via INTRAVENOUS

## 2016-11-06 MED ORDER — ONDANSETRON HCL 4 MG/2ML IJ SOLN
4.0000 mg | Freq: Once | INTRAMUSCULAR | Status: AC | PRN
  Administered 2016-11-06: 4 mg via INTRAVENOUS

## 2016-11-06 MED ORDER — ROCURONIUM BROMIDE 50 MG/5ML IV SOLN
INTRAVENOUS | Status: AC
  Filled 2016-11-06: qty 1

## 2016-11-06 MED ORDER — PROPOFOL 10 MG/ML IV BOLUS
INTRAVENOUS | Status: AC
  Filled 2016-11-06: qty 20

## 2016-11-06 MED ORDER — KETOROLAC TROMETHAMINE 30 MG/ML IJ SOLN
30.0000 mg | Freq: Three times a day (TID) | INTRAMUSCULAR | Status: DC
  Administered 2016-11-06 – 2016-11-07 (×3): 30 mg via INTRAVENOUS
  Filled 2016-11-06 (×3): qty 1

## 2016-11-06 MED ORDER — LIDOCAINE HCL (CARDIAC) 20 MG/ML IV SOLN
INTRAVENOUS | Status: DC | PRN
  Administered 2016-11-06: 80 mg via INTRAVENOUS

## 2016-11-06 MED ORDER — SUGAMMADEX SODIUM 500 MG/5ML IV SOLN
INTRAVENOUS | Status: AC
  Filled 2016-11-06: qty 5

## 2016-11-06 MED ORDER — ISOSORBIDE MONONITRATE ER 30 MG PO TB24: 30.0000 mg | ORAL_TABLET | Freq: Every day | ORAL | Status: DC

## 2016-11-06 MED ORDER — CEFAZOLIN SODIUM-DEXTROSE 2-3 GM-% IV SOLR
INTRAVENOUS | Status: DC | PRN
  Administered 2016-11-06: 2 g via INTRAVENOUS

## 2016-11-06 MED ORDER — SODIUM CHLORIDE 0.9 % IV SOLN
INTRAVENOUS | Status: DC | PRN
  Administered 2016-11-06: 10:00:00 via INTRAVENOUS

## 2016-11-06 MED ORDER — FENTANYL CITRATE (PF) 100 MCG/2ML IJ SOLN
INTRAMUSCULAR | Status: AC
  Filled 2016-11-06: qty 2

## 2016-11-06 MED ORDER — PROPOFOL 10 MG/ML IV BOLUS
INTRAVENOUS | Status: AC
Start: 2016-11-06 — End: ?
  Filled 2016-11-06: qty 20

## 2016-11-06 MED ORDER — NALOXONE HCL 0.4 MG/ML IJ SOLN: 0.4000 mg | INTRAMUSCULAR | Status: DC | PRN

## 2016-11-06 MED ORDER — CEFAZOLIN SODIUM-DEXTROSE 1-4 GM/50ML-% IV SOLN
INTRAVENOUS | Status: AC
  Filled 2016-11-06: qty 50

## 2016-11-06 MED ORDER — FLUORESCEIN SODIUM 10 % IV SOLN
INTRAVENOUS | Status: DC | PRN
  Administered 2016-11-06: 50 mg via INTRAVENOUS

## 2016-11-06 MED ORDER — CEFAZOLIN SODIUM-DEXTROSE 2-4 GM/100ML-% IV SOLN
INTRAVENOUS | Status: AC
  Filled 2016-11-06: qty 100

## 2016-11-06 MED ORDER — SEVOFLURANE IN SOLN
RESPIRATORY_TRACT | Status: AC
  Filled 2016-11-06: qty 250

## 2016-11-06 MED ORDER — ACETAMINOPHEN 10 MG/ML IV SOLN
INTRAVENOUS | Status: AC
  Filled 2016-11-06: qty 100

## 2016-11-06 MED ORDER — FENTANYL CITRATE (PF) 100 MCG/2ML IJ SOLN
INTRAMUSCULAR | Status: DC | PRN
  Administered 2016-11-06 (×5): 50 ug via INTRAVENOUS

## 2016-11-06 MED ORDER — ACETAMINOPHEN 10 MG/ML IV SOLN
INTRAVENOUS | Status: DC | PRN
Start: 2016-11-06 — End: 2016-11-06
  Administered 2016-11-06: 1000 mg via INTRAVENOUS

## 2016-11-06 MED ORDER — ISOSORBIDE MONONITRATE ER 30 MG PO TB24
30.0000 mg | ORAL_TABLET | Freq: Every day | ORAL | Status: DC
Start: 2016-11-06 — End: 2016-11-06
  Filled 2016-11-06 (×2): qty 1

## 2016-11-06 MED ORDER — HYDROMORPHONE HCL 1 MG/ML IJ SOLN
INTRAMUSCULAR | Status: DC | PRN
  Administered 2016-11-06: 0.5 mg via INTRAVENOUS

## 2016-11-06 MED ORDER — KETOROLAC TROMETHAMINE 30 MG/ML IJ SOLN: 30.0000 mg | Freq: Once | INTRAMUSCULAR | Status: DC

## 2016-11-06 MED ORDER — FENTANYL CITRATE (PF) 250 MCG/5ML IJ SOLN
INTRAMUSCULAR | Status: AC
Start: 2016-11-06 — End: ?
  Filled 2016-11-06: qty 5

## 2016-11-06 MED ORDER — PHENYLEPHRINE HCL 10 MG/ML IJ SOLN
INTRAMUSCULAR | Status: DC | PRN
  Administered 2016-11-06: 100 ug via INTRAVENOUS
  Administered 2016-11-06: 150 ug via INTRAVENOUS

## 2016-11-06 MED ORDER — PHENYLEPHRINE HCL 10 MG/ML IJ SOLN
INTRAMUSCULAR | Status: DC | PRN
  Administered 2016-11-06: 20 ug/min via INTRAVENOUS

## 2016-11-06 MED ORDER — HYDROMORPHONE HCL 1 MG/ML IJ SOLN
INTRAMUSCULAR | Status: AC
  Filled 2016-11-06: qty 1

## 2016-11-06 MED ORDER — SODIUM CHLORIDE 0.9% FLUSH: 9.0000 mL | INTRAVENOUS | Status: DC | PRN

## 2016-11-06 MED ORDER — LACTATED RINGERS IV SOLN
INTRAVENOUS | Status: DC
  Administered 2016-11-06 – 2016-11-08 (×5): via INTRAVENOUS

## 2016-11-06 MED ORDER — LIDOCAINE-EPINEPHRINE 1 %-1:100000 IJ SOLN
INTRAMUSCULAR | Status: AC
  Filled 2016-11-06: qty 1

## 2016-11-06 MED ORDER — DIPHENHYDRAMINE HCL 50 MG/ML IJ SOLN: 12.5000 mg | Freq: Four times a day (QID) | INTRAMUSCULAR | Status: DC | PRN

## 2016-11-06 MED ORDER — FENTANYL CITRATE (PF) 100 MCG/2ML IJ SOLN
INTRAMUSCULAR | Status: AC
  Administered 2016-11-06: 25 ug via INTRAVENOUS
  Filled 2016-11-06: qty 2

## 2016-11-06 MED ORDER — CEFAZOLIN SODIUM-DEXTROSE 1-4 GM/50ML-% IV SOLN
INTRAVENOUS | Status: DC | PRN
  Administered 2016-11-06: 1 g via INTRAVENOUS

## 2016-11-06 MED ORDER — MENTHOL 3 MG MT LOZG
1.0000 | LOZENGE | OROMUCOSAL | Status: DC | PRN
  Filled 2016-11-06: qty 9

## 2016-11-06 MED ORDER — ISOSORBIDE MONONITRATE ER 30 MG PO TB24
30.0000 mg | ORAL_TABLET | Freq: Every day | ORAL | Status: DC
  Filled 2016-11-06: qty 1

## 2016-11-06 SURGICAL SUPPLY — 56 items
BAG URINE DRAINAGE (UROLOGICAL SUPPLIES) ×1 IMPLANT
BAG URO DRAIN 2000ML W/SPOUT (MISCELLANEOUS) ×3 IMPLANT
CANISTER SUCT 1200ML W/VALVE (MISCELLANEOUS) ×3 IMPLANT
CATH FOLEY 2WAY  5CC 16FR (CATHETERS) ×1
CATH FOLEY 2WAY 5CC 16FR (CATHETERS) ×2
CATH ROBINSON RED A/P 16FR (CATHETERS) ×3 IMPLANT
CATH TRAY 16F METER LATEX (MISCELLANEOUS) ×3 IMPLANT
CATH URTH 16FR FL 2W BLN LF (CATHETERS) IMPLANT
CHLORAPREP W/TINT 26ML (MISCELLANEOUS) ×3 IMPLANT
COUNTER NEEDLE 20/40 LG (NEEDLE) ×1 IMPLANT
COVER LIGHT HANDLE STERIS (MISCELLANEOUS) ×1 IMPLANT
DRAPE LAP W/FLUID (DRAPES) ×3 IMPLANT
DRAPE SHEET LG 3/4 BI-LAMINATE (DRAPES) ×1 IMPLANT
DRAPE TABLE BACK 80X90 (DRAPES) ×1 IMPLANT
DRAPE UNDER BUTTOCK W/FLU (DRAPES) ×3 IMPLANT
DRSG OPSITE POSTOP 4X12 (GAUZE/BANDAGES/DRESSINGS) ×1 IMPLANT
DRSG TEGADERM 2-3/8X2-3/4 SM (GAUZE/BANDAGES/DRESSINGS) ×1 IMPLANT
DRSG TELFA 3X8 NADH (GAUZE/BANDAGES/DRESSINGS) ×3 IMPLANT
ELECT CAUTERY BLADE 6.4 (BLADE) ×3 IMPLANT
ELECT REM PT RETURN 9FT ADLT (ELECTROSURGICAL) ×3
ELECTRODE REM PT RTRN 9FT ADLT (ELECTROSURGICAL) ×2 IMPLANT
GAUZE SPONGE 4X4 12PLY STRL (GAUZE/BANDAGES/DRESSINGS) ×3 IMPLANT
GLOVE BIO SURGEON STRL SZ7 (GLOVE) ×3 IMPLANT
GLOVE INDICATOR 7.5 STRL GRN (GLOVE) ×3 IMPLANT
GOWN STRL REUS W/ TWL LRG LVL3 (GOWN DISPOSABLE) ×4 IMPLANT
GOWN STRL REUS W/ TWL XL LVL3 (GOWN DISPOSABLE) ×2 IMPLANT
GOWN STRL REUS W/TWL LRG LVL3 (GOWN DISPOSABLE) ×6
GOWN STRL REUS W/TWL XL LVL3 (GOWN DISPOSABLE) ×3
IV LACTATED RINGERS 1000ML (IV SOLUTION) ×3 IMPLANT
KIT RM TURNOVER CYSTO AR (KITS) ×3 IMPLANT
LABEL OR SOLS (LABEL) ×3 IMPLANT
LIGASURE IMPACT 36 18CM CVD LR (INSTRUMENTS) ×1 IMPLANT
NEEDLE HYPO 22GX1.5 SAFETY (NEEDLE) ×1 IMPLANT
NS IRRIG 500ML POUR BTL (IV SOLUTION) ×3 IMPLANT
PACK BASIN MAJOR ARMC (MISCELLANEOUS) ×3 IMPLANT
PAD DRESSING TELFA 3X8 NADH (GAUZE/BANDAGES/DRESSINGS) ×2 IMPLANT
PAD PREP 24X41 OB/GYN DISP (PERSONAL CARE ITEMS) ×3 IMPLANT
SET CYSTO W/LG BORE CLAMP LF (SET/KITS/TRAYS/PACK) ×3 IMPLANT
SOL PREP PVP 2OZ (MISCELLANEOUS) ×3
SOLUTION PREP PVP 2OZ (MISCELLANEOUS) ×2 IMPLANT
SPONGE LAP 18X18 5 PK (GAUZE/BANDAGES/DRESSINGS) ×8 IMPLANT
SPONGE XRAY 4X4 16PLY STRL (MISCELLANEOUS) ×3 IMPLANT
STAPLER SKIN PROX 35W (STAPLE) ×4 IMPLANT
STENT URET 6FRX24 CONTOUR (STENTS) ×2 IMPLANT
SURGILUBE 2OZ TUBE FLIPTOP (MISCELLANEOUS) ×3 IMPLANT
SUT PDS AB 1 TP1 96 (SUTURE) ×1 IMPLANT
SUT VIC AB 0 CT1 27 (SUTURE) ×27
SUT VIC AB 0 CT1 27XCR 8 STRN (SUTURE) ×6 IMPLANT
SUT VIC AB 0 CT1 36 (SUTURE) ×6 IMPLANT
SUT VIC AB 2-0 SH 27 (SUTURE) ×12
SUT VIC AB 2-0 SH 27XBRD (SUTURE) ×8 IMPLANT
SUT VICRYL PLUS ABS 0 54 (SUTURE) ×3 IMPLANT
SYR 30ML LL (SYRINGE) ×2 IMPLANT
SYR BULB IRRIG 60ML STRL (SYRINGE) ×3 IMPLANT
TRAY PREP VAG/GEN (MISCELLANEOUS) ×3 IMPLANT
WATER STERILE IRR 1000ML POUR (IV SOLUTION) ×3 IMPLANT

## 2016-11-06 NOTE — Transfer of Care (Signed)
Immediate Anesthesia Transfer of Care Note  Patient: [name]  Procedure(s) Performed: Procedure(s): HYSTERECTOMY ABDOMINAL WITH SALPINGECTOMY (Left) CYSTOSCOPY with stent placement (Bilateral)  Patient Location: PACU  Anesthesia Type:General  Level of Consciousness: awake, alert  and oriented  Airway & Oxygen Therapy: Patient Spontanous Breathing and Patient connected to face mask oxygen  Post-op Assessment: Report given to RN and Post -op Vital signs reviewed and stable  Post vital signs: Reviewed and stable  Last Vitals:  Vitals:   11/06/16 0928 11/06/16 1442  BP: (!) 178/99 132/79  Pulse: 65 90  Resp: 18 (!) 24  Temp: (!) 35.6 C (!) 36 C    Last Pain:  Vitals:   11/06/16 0928  TempSrc: Tympanic  PainSc: 0-No pain         Complications: No apparent anesthesia complications

## 2016-11-06 NOTE — Progress Notes (Signed)
Anticoagulation monitoring(Lovenox):  46 yo female ordered Lovenox 40 mg Q24h  Filed Weights   11/04/16 1130  Weight: 250 lb (113.4 kg)   BMI 44.4   Lab Results  Component Value Date   CREATININE 1.68 (H) 11/05/2016   CREATININE 1.67 (H) 11/05/2016   CREATININE 1.76 (H) 11/04/2016   Estimated Creatinine Clearance: 51.3 mL/min (A) (by C-G formula based on SCr of 1.68 mg/dL (H)). Hemoglobin & Hematocrit     Component Value Date/Time   HGB 11.8 (L) 11/05/2016 1321   HCT 37.5 11/05/2016 1321     Per Protocol for Patient with estCrcl > 30 ml/min and BMI > 40, will transition to Lovenox 40 mg Q12h.

## 2016-11-06 NOTE — Anesthesia Post-op Follow-up Note (Cosign Needed)
Anesthesia QCDR form completed.        

## 2016-11-06 NOTE — Clinical Social Work Note (Signed)
CSW reviewed weekend CSW consult and documentation. RN CM will see if home needs are in question. CSW signing off. Please re consult CSW if needs arise.  Shela Leff MSW,LCSW [phone]

## 2016-11-06 NOTE — Anesthesia Procedure Notes (Addendum)
Procedure Name: Intubation Date/Time: 11/06/2016 10:22 AM Performed by: Hedda Slade Pre-anesthesia Checklist: Patient identified, Patient being monitored, Timeout performed, Emergency Drugs available and Suction available Patient Re-evaluated:Patient Re-evaluated prior to inductionOxygen Delivery Method: Circle system utilized Preoxygenation: Pre-oxygenation with 100% oxygen Intubation Type: IV induction Ventilation: Mask ventilation without difficulty Laryngoscope Size: Mac and 3 Grade View: Grade I Tube type: Oral Tube size: 7.0 mm Number of attempts: 1 Airway Equipment and Method: Stylet Placement Confirmation: ETT inserted through vocal cords under direct vision,  positive ETCO2 and breath sounds checked- equal and bilateral Secured at: 20 cm Tube secured with: Tape Comments: Right upper front tooth extremely loose and removed prior to intubation.  Patient verbalized understanding of the possibility of loosing that tooth prior to induction.  Other teeth and oropharynx as per preop assessment

## 2016-11-06 NOTE — Progress Notes (Signed)
[facility] [place] NAME: [name]    MR#:  [id number]  DATE OF BIRTH:  27-May-1970  SUBJECTIVE:  CHIEF COMPLAINT:   Chief Complaint  Patient presents with  . Abdominal Pain  . Constipation   Patient has returned from her total abdominal hysterectomy and left salpingectomy  Her main concern is abdominal pain at surgical site at this time. No shortness of breath or chest pain  REVIEW OF SYSTEMS:    Review of Systems  Constitutional: Positive for malaise/fatigue. Negative for chills and fever.  HENT: Negative for sore throat.   Eyes: Negative for blurred vision, double vision and pain.  Respiratory: Negative for cough, hemoptysis, shortness of breath and wheezing.   Cardiovascular: Negative for chest pain, palpitations, orthopnea and leg swelling.  Gastrointestinal: Positive for abdominal pain. Negative for constipation, diarrhea, heartburn, nausea and vomiting.  Genitourinary: Negative for dysuria and hematuria.  Musculoskeletal: Negative for back pain and joint pain.  Skin: Negative for rash.  Neurological: Positive for weakness. Negative for sensory change, speech change, focal weakness and headaches.  Endo/Heme/Allergies: Does not bruise/bleed easily.  Psychiatric/Behavioral: Negative for depression. The patient is not nervous/anxious.     DRUG ALLERGIES:   Allergies  Allergen Reactions  . Ace Inhibitors Cough  . Latex Swelling    VITALS:  Blood pressure 120/69, pulse 60, temperature (!) 97.5 F (36.4 C), temperature source Oral, resp. rate 18, height 5\' 3"  (1.6 m), weight 113.4 kg (250 lb), SpO2 100 %.  PHYSICAL EXAMINATION:   Physical Exam  GENERAL:  46 y.o.-year-old patient lying in the bed with no acute distress.  EYES: Pupils equal, round, reactive to light and accommodation. No scleral icterus. Extraocular muscles intact.  HEENT: Head atraumatic, normocephalic. Oropharynx and nasopharynx clear.  NECK:   Supple, no jugular venous distention. No thyroid enlargement, no tenderness.  LUNGS: Decreased air entry at the bases. Normal work of breathing. No wheezing. CARDIOVASCULAR: S1, S2 normal.  ABDOMEN: Soft, tenderness in the surgical area, nondistended. Bowel sounds present. Dressing EXTREMITIES: No cyanosis, clubbing or edema b/l.    PSYCHIATRIC: The patient is alert  SKIN: No obvious rash, lesion, or ulcer.   LABORATORY PANEL:   CBC  Recent Labs Lab 11/05/16 1321  WBC 7.7  HGB 11.8*  HCT 37.5  PLT 363   ------------------------------------------------------------------------------------------------------------------ Chemistries   Recent Labs Lab 11/05/16 1321  NA 137  K 3.8  CL 104  CO2 25  GLUCOSE 110*  BUN 16  CREATININE 1.68*  CALCIUM 10.0  AST 101*  ALT 70*  ALKPHOS 84  BILITOT 1.3*   ------------------------------------------------------------------------------------------------------------------  Cardiac Enzymes  Recent Labs Lab 11/05/16 0327  TROPONINI <0.03   ------------------------------------------------------------------------------------------------------------------  RADIOLOGY:  No results found.   ASSESSMENT AND PLAN:   * Uncontrolled hypertension Presently blood pressure is in the normal range after surgery likely due to blood loss and anesthesia. Will add Imdur from tomorrow as patient blood pressure has been consistently running in the 010X systolic. Presently on metoprolol and hydralazine.  * CKD stage III is stable  * Fibroid uterus. Status post Total abdominal hysterectomy, Left Salpingectomy  Patient has significant blood loss(2100) but received IV fluids and 570 mL pRBC. Monitor hemoglobin. We will bolus normal saline 323 ML with systolic blood pressure drops less than 100.  Management plans discussed with the patient and Nursing staff.  DVT Prophylaxis: SCDs  TOTAL TIME TAKING CARE OF THIS PATIENT: 30 minutes.    [name], [employer].D  on 11/06/2016 at 4:40 PM  Between 7am to 6pm - Pager - [phone]  After 6pm go to [email or web address] - password [email or web address] [facility]  Office  [phone]  CC: Primary care physician; Center, [facility]  Note: This dictation was prepared with Dragon dictation along with smaller phrase technology. Any transcriptional errors that result from this process are unintentional.

## 2016-11-06 NOTE — Op Note (Signed)
[name] PROCEDURE DATE: 11/04/2016 - 11/06/2016  PREOPERATIVE DIAGNOSIS:  Symptomatic fibroids, menorrhagia POSTOPERATIVE DIAGNOSIS:  Symptomatic fibroids, menorrhagia SURGEON:   Benjaman Kindler, M.D. ASSISTANT: [name], M.D. OPERATION:  Total abdominal hysterectomy, Left Salpingectomy through supraumbilical vertical incision, placement and removal of ureteral stents, cystoscopy ANESTHESIA:  General endotracheal.  INDICATIONS: The patient is a 46 y.o. G0 with the aforementioned diagnoses who desires definitive surgical management. On the preoperative visit, the risks, benefits, indications, and alternatives of the procedure were reviewed with the patient.  On the day of surgery, the risks of surgery were again discussed with the patient including but not limited to: bleeding which may require transfusion or reoperation; infection which may require antibiotics; injury to bowel, bladder, ureters or other surrounding organs; need for additional procedures; thromboembolic phenomenon, incisional problems and other postoperative/anesthesia complications. Written informed consent was obtained.   OPERATIVE FINDINGS: Fibroid uterus to bilateral upper quadrants, normal cervix. Total weight without cervix, which was removed separately and not weighed: 4426g.  ESTIMATED BLOOD LOSS: 2100 ml FLUIDS:  1700 ml of Lactated Ringers and 57ml of pRBC URINE OUTPUT:  500 ml of clear yellow urine. SPECIMENS:  Uterus,cervix,  left fallopian tubes sent to pathology COMPLICATIONS:  None immediate.  DESCRIPTION OF PROCEDURE: The patient received intravenous antibiotics and had sequential compression devices applied to her lower extremities while in the preoperative area.   She was taken to the operating room and placed under general anesthesia without difficulty.  The abdomen and perineum were prepped and draped in a sterile manner, and she was placed in a dorsal supine position. 53F whistletop bilateral  ureteral stents were placed to 20 cm. A Foley catheter was inserted into the bladder and attached to constant drainage. After an adequate timeout was performed, an supraumbilical vertical skin incision was made, curving the the left of the umbilicus. This incision was taken down to the fascia using electrocautery with care given to maintain good hemostasis. The fascia was incised in the midline and the fascial incision was then extended superiorly and inferiorly using electrocautery without difficulty.  The rectus muscles were split bluntly in the midline and the peritoneum entered sharply without complication. This peritoneal incision was then extended superiorly and inferiorly with care given to prevent bowel or bladder injury. The uterus created its own retraction and no retractor was placed. The bowel was packed away with moist laparotomy sponges.     The bowel was packed away with moist laparotomy sponges. 1% lidocaine with epi was injected around the base of the largest fibroid, and this fibroid was enucleated. However, significant bleeding was noted, and multiple stitches were placed. At this point we gave up on the lidocaine, and decompressed the uterus as much as possible to allow visualization of the lateral angles. This included careful removal of the bowel, which was attached to the posterior aspect of the uterus with filmy adhesions. The bowel was gently moved down and out of the way.   The round ligaments on right side were clamped, suture ligated with 0 Vicryl, and transected with electrocautery allowing entry into the broad ligament. Of note, all sutures used in this procedure are 0 Vicryl unless otherwise noted. The anterior and posterior leaves of the broad ligament were separated, and the ureters were inspected to be safely away from the area of dissection on the right side.  Adnexae were clamped on the patient's right side, cut, and doubly suture ligated. This procedure was repeated  on the  left site allowing for  both adnexa to remain in place.  Kelly clamps were placed on the mesosalpinx of the left fallopian tube, and the fallopian tube was excised.  The pedicle was then secured with a free tie.   A bladder flap was then created.  The bladder was then bluntly dissected off the lower uterine segment and cervix with good hemostasis noted. The uterine arteries were then skeletonized bilaterally and then clamped, cut, and doubly suture ligated with care given to prevent ureteral injury. The uterosacral ligaments were then clamped, cut, and ligated bilaterally. At this point, the cervix was amputated from the specimen, and the uterus removed from the abdominal cavity. Finally, the cardinal ligaments were clamped, cut, and ligated bilaterally.  Acutely curved clamps were placed across the vagina just under the cervix, and the specimen was amputated and sent to pathology. The vaginal cuff angles were closed with Heaney stiches with care given to incorporate the uterosacral-cardinal ligament pedicles on both sides. The middle of the vaginal cuff was closed with a series of interrupted figure-of-eight sutures with care given to incorporate the anterior pubocervical fascia and the posterior rectovaginal fascia.     The pelvis was irrigated and hemostasis was reconfirmed at all pedicles and along the pelvic sidewall.  The ureters were inspected by touch, and the right side was palpable while the left side was not. .  All laparotomy sponges and instruments were removed from the abdomen. The fascia, rectus muscles and peritoneum were closed in a mass fashion using a looped 0 PDS suture in a running fashion. The skin was closed with a staples.   A cystoscopy was undertaken, after removal of the ureteral stents, and fluorescein given IV to assist with evaluation. Both ureteral stents were intact, with the whistle-tip's in place, and bilateral ureteral jets were noted.   Sponge, lap, needle, and instrument  counts were correct times two. The patient was taken to the recovery area awake, extubated and in stable condition.

## 2016-11-06 NOTE — Progress Notes (Signed)
Day of Surgery Procedure(s): HYSTERECTOMY ABDOMINAL WITH SALPINGECTOMY (Left) CYSTOSCOPY with stent placement (Bilateral) Subjective: Pain is adequately controlled. No SOB or CP. Resting quietly.   Objective: Vital signs in last 24 hours: Temp:  [96.1 F (35.6 C)-98.6 F (37 C)] 97.5 F (36.4 C) (07/09 1709) Pulse Rate:  [57-101] 61 (07/09 1709) Resp:  [16-24] 18 (07/09 1709) BP: (105-181)/(69-104) 129/80 (07/09 1709) SpO2:  [96 %-100 %] 100 % (07/09 1709) FiO2 (%):  [20 %] 20 % (07/09 1651)  Intake/Output  Intake/Output Summary (Last 24 hours) at 11/06/16 1748 Last data filed at 11/06/16 1539  Gross per 24 hour  Intake          3315.34 ml  Output             3650 ml  Net          -334.66 ml    Physical Exam:  General: Alert and oriented. CV: RRR Lungs: Clear bilaterally. GI: Soft, Nondistended. Incisions: Clean and dry. Urine: Clear, Foley in place Extremities: Nontender, no erythema, no edema.  Assessment/Plan: POD# 0 s/p TAH/ LS, cystoscopy for very enlarged fibroid uterus.  1) Encourage Incentive spirometry 2) Advance diet as tolerated, but may do clears overnight 3) PCA overnight 4) Likely D/C Foley in am 5) OOB to chair for 4 hours tomorrow, 2 hours tonight   Benjaman Kindler, MD   LOS: 2 days   Benjaman Kindler 11/06/2016, 5:48 PM

## 2016-11-06 NOTE — Progress Notes (Signed)
SUBJECTIVE: Patient is feeling no chest pain or shortness of breath   Vitals:   11/05/16 2245 11/06/16 0041 11/06/16 0500 11/06/16 0748  BP: (!) 160/86 (!) 158/83 (!) 181/81 (!) 178/104  Pulse: (!) 101 79 80 80  Resp: 19 19 20 18   Temp: 98.4 F (36.9 C) 98.6 F (37 C) 98 F (36.7 C) 98.4 F (36.9 C)  TempSrc: Oral Oral Oral Oral  SpO2: 100% 100% 96% 99%  Weight:      Height:        Intake/Output Summary (Last 24 hours) at 11/06/16 0751 Last data filed at 11/06/16 0456  Gross per 24 hour  Intake          1125.34 ml  Output              750 ml  Net           375.34 ml    LABS: Basic Metabolic Panel:  Recent Labs  11/05/16 1254 11/05/16 1321  NA 137 137  K 3.5 3.8  CL 104 104  CO2 24 25  GLUCOSE 96 110*  BUN 16 16  CREATININE 1.67* 1.68*  CALCIUM 10.1 10.0  PHOS 2.3*  --    Liver Function Tests:  Recent Labs  11/04/16 1132 11/05/16 1254 11/05/16 1321  AST 61*  --  101*  ALT 60*  --  70*  ALKPHOS 82  --  84  BILITOT 0.5  --  1.3*  PROT 8.7*  --  7.7  ALBUMIN 3.2* 2.9* 2.8*    Recent Labs  11/04/16 1132  LIPASE 33   CBC:  Recent Labs  11/04/16 1132 11/05/16 1321  WBC 7.7 7.7  NEUTROABS  --  5.6  HGB 10.5* 11.8*  HCT 34.1* 37.5  MCV 73.0* 74.0*  PLT 378 363   Cardiac Enzymes:  Recent Labs  11/04/16 1133 11/04/16 2036 11/05/16 0327  TROPONINI 0.04* <0.03 <0.03   BNP: Invalid input(s): POCBNP D-Dimer: No results for input(s): DDIMER in the last 72 hours. Hemoglobin A1C: No results for input(s): HGBA1C in the last 72 hours. Fasting Lipid Panel: No results for input(s): CHOL, HDL, LDLCALC, TRIG, CHOLHDL, LDLDIRECT in the last 72 hours. Thyroid Function Tests: No results for input(s): TSH, T4TOTAL, T3FREE, THYROIDAB in the last 72 hours.  Invalid input(s): FREET3 Anemia Panel: No results for input(s): VITAMINB12, FOLATE, FERRITIN, TIBC, IRON, RETICCTPCT in the last 72 hours.   PHYSICAL EXAM General: Well developed, well  nourished, in no acute distress HEENT:  Normocephalic and atramatic Neck:  No JVD.  Lungs: Clear bilaterally to auscultation and percussion. Heart: HRRR . Normal S1 and S2 without gallops or murmurs.  Abdomen: Bowel sounds are positive, abdomen soft and non-tender  Msk:  Back normal, normal gait. Normal strength and tone for age. Extremities: No clubbing, cyanosis or edema.   Neuro: Alert and oriented X 3. Psych:  Good affect, responds appropriately  TELEMETRY: Sinus rhythm  ASSESSMENT AND PLAN: Fibroid uterus with bleeding and history of normal coronaries and normal ejection fraction on cardiac catheterization done recently. Advise proceeding with surgery as is low-risk.  Active Problems:   Fibroid uterus    Dionisio David, MD, [facility] 11/06/2016 7:51 AM

## 2016-11-06 NOTE — Progress Notes (Signed)
Patient currently in surgery.

## 2016-11-07 ENCOUNTER — Encounter: Payer: Self-pay | Admitting: Obstetrics and Gynecology

## 2016-11-07 LAB — TYPE AND SCREEN
ABO/RH(D): O POS
ANTIBODY SCREEN: NEGATIVE
UNIT DIVISION: 0
Unit division: 0
Unit division: 0
Unit division: 0

## 2016-11-07 LAB — BPAM RBC
BLOOD PRODUCT EXPIRATION DATE: 201807202359
BLOOD PRODUCT EXPIRATION DATE: 201807262359
Blood Product Expiration Date: 201807202359
Blood Product Expiration Date: 201807262359
ISSUE DATE / TIME: 201807072056
ISSUE DATE / TIME: 201807091159
ISSUE DATE / TIME: 201807080013
ISSUE DATE / TIME: 201807091159
UNIT TYPE AND RH: 5100
UNIT TYPE AND RH: 5100
Unit Type and Rh: 5100
Unit Type and Rh: 5100

## 2016-11-07 LAB — COMPREHENSIVE METABOLIC PANEL
ALBUMIN: 2.1 g/dL — AB (ref 3.5–5.0)
ALK PHOS: 49 U/L (ref 38–126)
ALT: 41 U/L (ref 14–54)
AST: 56 U/L — AB (ref 15–41)
Anion gap: 5 (ref 5–15)
BILIRUBIN TOTAL: 0.5 mg/dL (ref 0.3–1.2)
BUN: 20 mg/dL (ref 6–20)
CHLORIDE: 112 mmol/L — AB (ref 101–111)
CO2: 23 mmol/L (ref 22–32)
CREATININE: 2.26 mg/dL — AB (ref 0.44–1.00)
Calcium: 9.1 mg/dL (ref 8.9–10.3)
GFR calc Af Amer: 29 mL/min — ABNORMAL LOW (ref >60)
GFR calc non Af Amer: 25 mL/min — ABNORMAL LOW (ref >60)
Glucose, Bld: 91 mg/dL (ref 65–99)
Potassium: 4.8 mmol/L (ref 3.5–5.1)
SODIUM: 140 mmol/L (ref 135–145)
Total Protein: 5.5 g/dL — ABNORMAL LOW (ref 6.5–8.1)

## 2016-11-07 LAB — CBC
HEMATOCRIT: 30.8 % — AB (ref 35.0–47.0)
HEMOGLOBIN: 10.1 g/dL — AB (ref 12.0–16.0)
MCH: 24.9 pg — ABNORMAL LOW (ref 26.0–34.0)
MCHC: 32.8 g/dL (ref 32.0–36.0)
MCV: 76 fL — ABNORMAL LOW (ref 80.0–100.0)
Platelets: 289 10*3/uL (ref 150–440)
RBC: 4.05 MIL/uL (ref 3.80–5.20)
RDW: 20.3 % — ABNORMAL HIGH (ref 11.5–14.5)
WBC: 10.7 10*3/uL (ref 3.6–11.0)

## 2016-11-07 MED ORDER — LACTATED RINGERS IV BOLUS (SEPSIS)
500.0000 mL | Freq: Once | INTRAVENOUS | Status: AC
  Administered 2016-11-07: 500 mL via INTRAVENOUS

## 2016-11-07 MED ORDER — HYDROMORPHONE HCL 1 MG/ML IJ SOLN: 1.0000 mg | INTRAMUSCULAR | Status: DC | PRN

## 2016-11-07 MED ORDER — ACETAMINOPHEN 500 MG PO TABS
1000.0000 mg | ORAL_TABLET | Freq: Four times a day (QID) | ORAL | Status: AC
  Administered 2016-11-07 – 2016-11-09 (×6): 1000 mg via ORAL
  Filled 2016-11-07 (×9): qty 2

## 2016-11-07 MED ORDER — GABAPENTIN 300 MG PO CAPS
900.0000 mg | ORAL_CAPSULE | Freq: Every day | ORAL | Status: AC
  Administered 2016-11-07 – 2016-11-09 (×3): 900 mg via ORAL
  Filled 2016-11-07 (×3): qty 3

## 2016-11-07 MED ORDER — HYDROMORPHONE HCL 1 MG/ML IJ SOLN: 0.2000 mg | INTRAMUSCULAR | Status: DC | PRN

## 2016-11-07 MED ORDER — OXYCODONE-ACETAMINOPHEN 5-325 MG PO TABS
1.0000 | ORAL_TABLET | ORAL | Status: DC | PRN
  Administered 2016-11-07 (×2): 2 via ORAL
  Administered 2016-11-08 – 2016-11-09 (×2): 1 via ORAL
  Filled 2016-11-07 (×2): qty 2
  Filled 2016-11-07 (×2): qty 1
  Filled 2016-11-07 (×2): qty 2

## 2016-11-07 MED ORDER — IBUPROFEN 600 MG PO TABS: 600.0000 mg | ORAL_TABLET | Freq: Four times a day (QID) | ORAL | Status: DC | PRN

## 2016-11-07 NOTE — Progress Notes (Signed)
SUBJECTIVE: has pain in lower abdomen, but no cp  Vitals:   11/07/16 0117 11/07/16 0316 11/07/16 0354 11/07/16 0724  BP:  137/64  130/64  Pulse:  (!) 55  75  Resp: 16 17 16 18   Temp:  98 F (36.7 C)  98 F (36.7 C)  TempSrc:  Oral  Oral  SpO2: 98% 99% 98% 100%  Weight:      Height:        Intake/Output Summary (Last 24 hours) at 11/07/16 1042 Last data filed at 11/07/16 1005  Gross per 24 hour  Intake          4124.62 ml  Output             3100 ml  Net          1024.62 ml    LABS: Basic Metabolic Panel:  Recent Labs  11/05/16 1254 11/05/16 1321 11/07/16 0544  NA 137 137 140  K 3.5 3.8 4.8  CL 104 104 112*  CO2 24 25 23   GLUCOSE 96 110* 91  BUN 16 16 20   CREATININE 1.67* 1.68* 2.26*  CALCIUM 10.1 10.0 9.1  PHOS 2.3*  --   --    Liver Function Tests:  Recent Labs  11/05/16 1321 11/07/16 0544  AST 101* 56*  ALT 70* 41  ALKPHOS 84 49  BILITOT 1.3* 0.5  PROT 7.7 5.5*  ALBUMIN 2.8* 2.1*    Recent Labs  11/04/16 1132  LIPASE 33   CBC:  Recent Labs  11/05/16 1321 11/07/16 0544  WBC 7.7 10.7  NEUTROABS 5.6  --   HGB 11.8* 10.1*  HCT 37.5 30.8*  MCV 74.0* 76.0*  PLT 363 289   Cardiac Enzymes:  Recent Labs  11/04/16 1133 11/04/16 2036 11/05/16 0327  TROPONINI 0.04* <0.03 <0.03   BNP: Invalid input(s): POCBNP D-Dimer: No results for input(s): DDIMER in the last 72 hours. Hemoglobin A1C: No results for input(s): HGBA1C in the last 72 hours. Fasting Lipid Panel: No results for input(s): CHOL, HDL, LDLCALC, TRIG, CHOLHDL, LDLDIRECT in the last 72 hours. Thyroid Function Tests: No results for input(s): TSH, T4TOTAL, T3FREE, THYROIDAB in the last 72 hours.  Invalid input(s): FREET3 Anemia Panel: No results for input(s): VITAMINB12, FOLATE, FERRITIN, TIBC, IRON, RETICCTPCT in the last 72 hours.   PHYSICAL EXAM General: Well developed, well nourished, in no acute distress HEENT:  Normocephalic and atramatic Neck:  No JVD.  Lungs:  Clear bilaterally to auscultation and percussion. Heart: HRRR . Normal S1 and S2 without gallops or murmurs.  Abdomen: Bowel sounds are positive, abdomen soft and non-tender  Msk:  Back normal, normal gait. Normal strength and tone for age. Extremities: No clubbing, cyanosis or edema.   Neuro: Alert and oriented X 3. Psych:  Good affect, responds appropriately  TELEMETRY:   ASSESSMENT AND PLAN: S/P Hysterectomy, and doing well, no chest pain or palpitation. Will sign off.   Lauren David, MD, [facility] 11/07/2016 10:42 AM

## 2016-11-07 NOTE — Progress Notes (Addendum)
1 Day Post-Op       Procedure(s): HYSTERECTOMY ABDOMINAL WITH SALPINGECTOMY (Left) CYSTOSCOPY with stent placement (Bilateral) Subjective: The patient is doing well.  No nausea or vomiting. Pain is adequately controlled with the PCA. She is hungry and tolerated a clear diet yesterday.  Objective: Vital signs in last 24 hours: Temp:  [96.1 F (35.6 C)-98.7 F (37.1 C)] 98 F (36.7 C) (07/10 0724) Pulse Rate:  [55-90] 75 (07/10 0724) Resp:  [16-24] 18 (07/10 0724) BP: (105-178)/(63-99) 130/64 (07/10 0724) SpO2:  [98 %-100 %] 100 % (07/10 0724) FiO2 (%):  [19 %-20 %] 19 % (07/09 2108)  Intake/Output  Intake/Output Summary (Last 24 hours) at 11/07/16 0817 Last data filed at 11/07/16 9767  Gross per 24 hour  Intake          3764.62 ml  Output             3100 ml  Net           664.62 ml    Physical Exam:  General: Alert and oriented. CV: RRR Lungs: Clear bilaterally. GI: Soft, Nondistended. Incisions: Clean and dry. Urine: Clear, Foley in place Extremities: Nontender, no erythema, no edema.  Lab Results:  Recent Labs  11/04/16 1132 11/05/16 1321 11/07/16 0544  HGB 10.5* 11.8* 10.1*  HCT 34.1* 37.5 30.8*  WBC 7.7 7.7 10.7  PLT 378 363 289                 Results for orders placed or performed during the hospital encounter of 11/04/16 (from the past 24 hour(s))  Pregnancy, urine     Status: None   Collection Time: 11/06/16  8:39 AM  Result Value Ref Range   Preg Test, Ur NEGATIVE NEGATIVE  Prepare RBC (crossmatch)     Status: None   Collection Time: 11/06/16 12:30 PM  Result Value Ref Range   Order Confirmation ORDER PROCESSED BY BLOOD BANK   CBC     Status: Abnormal   Collection Time: 11/07/16  5:44 AM  Result Value Ref Range   WBC 10.7 3.6 - 11.0 K/uL   RBC 4.05 3.80 - 5.20 MIL/uL   Hemoglobin 10.1 (L) 12.0 - 16.0 g/dL   HCT 30.8 (L) 35.0 - 47.0 %   MCV 76.0 (L) 80.0 - 100.0 fL   MCH 24.9 (L) 26.0 - 34.0 pg   MCHC 32.8 32.0 - 36.0 g/dL   RDW 20.3 (H)  11.5 - 14.5 %   Platelets 289 150 - 440 K/uL  Comprehensive metabolic panel     Status: Abnormal   Collection Time: 11/07/16  5:44 AM  Result Value Ref Range   Sodium 140 135 - 145 mmol/L   Potassium 4.8 3.5 - 5.1 mmol/L   Chloride 112 (H) 101 - 111 mmol/L   CO2 23 22 - 32 mmol/L   Glucose, Bld 91 65 - 99 mg/dL   BUN 20 6 - 20 mg/dL   Creatinine, Ser 2.26 (H) 0.44 - 1.00 mg/dL   Calcium 9.1 8.9 - 10.3 mg/dL   Total Protein 5.5 (L) 6.5 - 8.1 g/dL   Albumin 2.1 (L) 3.5 - 5.0 g/dL   AST 56 (H) 15 - 41 U/L   ALT 41 14 - 54 U/L   Alkaline Phosphatase 49 38 - 126 U/L   Total Bilirubin 0.5 0.3 - 1.2 mg/dL   GFR calc non Af Amer 25 (L) >60 mL/min   GFR calc Af Amer 29 (L) >60 mL/min   Anion  gap 5 5 - 15    Assessment/Plan: 1 Day Post-Op       Procedure(s): HYSTERECTOMY ABDOMINAL WITH SALPINGECTOMY (Left) CYSTOSCOPY with stent placement (Bilateral) and stent removal - AKI: follow Cr after surgery, keep hydrated. Appreciate hospitalist assistance with chronic kidney disease - Anemia: Stable after surgery - eLevated liver enzymes: improving - postop care:  1) Ambulate, Incentive spirometry - out of bed to chair for 4 hours today 2) Advance diet as tolerated 3) Transition to oral pain medication with iv rescue - ERAS without ibuprofen 4) D/C Foley 5) Hold NSAIDS for declining kidney function 6) Consult dentistry if available  Benjaman Kindler, MD   LOS: 3 days   Benjaman Kindler 11/07/2016, 8:17 AM

## 2016-11-07 NOTE — Anesthesia Postprocedure Evaluation (Signed)
Anesthesia Post Note  Patient: Zoriyah Scheidegger  Procedure(s) Performed: Procedure(s) (LRB): HYSTERECTOMY ABDOMINAL WITH SALPINGECTOMY (Left) CYSTOSCOPY with stent placement (Bilateral)  Patient location during evaluation: PACU Anesthesia Type: General Level of consciousness: awake and alert Pain management: pain level controlled Vital Signs Assessment: post-procedure vital signs reviewed and stable Respiratory status: spontaneous breathing, nonlabored ventilation, respiratory function stable and patient connected to nasal cannula oxygen Cardiovascular status: blood pressure returned to baseline and stable Postop Assessment: no signs of nausea or vomiting Anesthetic complications: no     Last Vitals:  Vitals:   11/07/16 0316 11/07/16 0354  BP: 137/64   Pulse: (!) 55   Resp: 17 16  Temp: 36.7 C     Last Pain:  Vitals:   11/07/16 0354  TempSrc:   PainSc: 1                  Precious Haws Piscitello

## 2016-11-07 NOTE — Progress Notes (Signed)
[place] [facility] [employer] NAME: [name]    MR#:  [id number]  DATE OF BIRTH:  1970/10/24  SUBJECTIVE:   POD #2 Total abdominal hysterectomy. Having some pain at surgical site. Tolerating diet. REVIEW OF SYSTEMS:   Review of Systems  Constitutional: Negative for chills, fever and weight loss.  HENT: Negative for ear discharge, ear pain and nosebleeds.   Eyes: Negative for blurred vision, pain and discharge.  Respiratory: Negative for sputum production, shortness of breath, wheezing and stridor.   Cardiovascular: Negative for chest pain, palpitations, orthopnea and PND.  Gastrointestinal: Positive for abdominal pain. Negative for diarrhea, nausea and vomiting.  Genitourinary: Negative for frequency and urgency.  Musculoskeletal: Negative for back pain and joint pain.  Neurological: Positive for weakness. Negative for sensory change, speech change and focal weakness.  Psychiatric/Behavioral: Negative for depression and hallucinations. The patient is not nervous/anxious.    Tolerating Diet:yes Tolerating PT: yes  DRUG ALLERGIES:   Allergies  Allergen Reactions  . Ace Inhibitors Cough  . Latex Swelling    VITALS:  Blood pressure 125/67, pulse 62, temperature 97.7 F (36.5 C), temperature source Oral, resp. rate 16, height 5\' 3"  (1.6 m), weight 113.4 kg (250 lb), SpO2 100 %.  PHYSICAL EXAMINATION:   Physical Exam  GENERAL:  46 y.o.-year-old patient lying in the bed with no acute distress.  EYES: Pupils equal, round, reactive to light and accommodation. No scleral icterus. Extraocular muscles intact.  HEENT: Head atraumatic, normocephalic. Oropharynx and nasopharynx clear.  NECK:  Supple, no jugular venous distention. No thyroid enlargement, no tenderness.  LUNGS: Normal breath sounds bilaterally, no wheezing, rales, rhonchi. No use of accessory muscles of respiration.  CARDIOVASCULAR: S1, S2 normal. No murmurs, rubs, or  gallops.  ABDOMEN: Soft, nontender, nondistended. Bowel sounds present. No organomegaly or mass. Midline surgical dressing+ foley EXTREMITIES: No cyanosis, clubbing or edema b/l.    NEUROLOGIC: Cranial nerves II through XII are intact. No focal Motor or sensory deficits b/l.   PSYCHIATRIC:  patient is alert and oriented x 3.  SKIN: No obvious rash, lesion, or ulcer.   LABORATORY PANEL:  CBC  Recent Labs Lab 11/07/16 0544  WBC 10.7  HGB 10.1*  HCT 30.8*  PLT 289    Chemistries   Recent Labs Lab 11/07/16 0544  NA 140  K 4.8  CL 112*  CO2 23  GLUCOSE 91  BUN 20  CREATININE 2.26*  CALCIUM 9.1  AST 56*  ALT 41  ALKPHOS 49  BILITOT 0.5   Cardiac Enzymes  Recent Labs Lab 11/05/16 0327  TROPONINI <0.03   RADIOLOGY:  No results found. ASSESSMENT AND PLAN:   * Uncontrolled hypertension BP much improved Presently on metoprolol and hydralazine.  * CKD stage III is stable -creat a bit up -cont IVF and I and O  * Fibroid uterus. Status post Total abdominal hysterectomy, Left Salpingectomy  Patient has significant blood loss(2100) but received IV fluids and 570 mL pRBC.  Case discussed with Care Management/Social Worker. Management plans discussed with the patient, family and they are in agreement.  CODE STATUS: full  DVT Prophylaxis: ambulation  TOTAL TIME TAKING CARE OF THIS PATIENT: *30* minutes.  >50% time spent on counselling and coordination of care  POSSIBLE D/C IN 1-2 DAYS, DEPENDING ON CLINICAL CONDITION.  Note: This dictation was prepared with Dragon dictation along with smaller phrase technology. Any transcriptional errors that result from this process are unintentional.  Bryndan Bilyk M.D on 11/07/2016  at 2:43 PM  Between 7am to 6pm - Pager - [phone]  After 6pm go to [email or web address] - password [email or web address] [facility]  Office  [phone]  CC: Primary care physician; Center, [facility]

## 2016-11-08 LAB — BASIC METABOLIC PANEL
Anion gap: 7 (ref 5–15)
BUN: 25 mg/dL — ABNORMAL HIGH (ref 6–20)
CO2: 22 mmol/L (ref 22–32)
CREATININE: 2.32 mg/dL — AB (ref 0.44–1.00)
Calcium: 8.9 mg/dL (ref 8.9–10.3)
Chloride: 107 mmol/L (ref 101–111)
GFR, EST AFRICAN AMERICAN: 28 mL/min — AB (ref >60)
GFR, EST NON AFRICAN AMERICAN: 24 mL/min — AB (ref >60)
GLUCOSE: 126 mg/dL — AB (ref 65–99)
Potassium: 4.3 mmol/L (ref 3.5–5.1)
Sodium: 136 mmol/L (ref 135–145)

## 2016-11-08 MED ORDER — ISOSORBIDE MONONITRATE ER 30 MG PO TB24: 30.0000 mg | ORAL_TABLET | Freq: Every day | ORAL | Status: DC

## 2016-11-08 MED ORDER — ATORVASTATIN CALCIUM 40 MG PO TABS
40.0000 mg | ORAL_TABLET | Freq: Every day | ORAL | Status: DC
  Administered 2016-11-08: 40 mg via ORAL
  Filled 2016-11-08 (×2): qty 1

## 2016-11-08 MED ORDER — SODIUM CHLORIDE 0.9 % IV SOLN
Freq: Once | INTRAVENOUS | Status: AC
  Administered 2016-11-08 – 2016-11-09 (×2): via INTRAVENOUS

## 2016-11-08 MED ORDER — AMLODIPINE BESYLATE 5 MG PO TABS
5.0000 mg | ORAL_TABLET | Freq: Every day | ORAL | Status: DC
  Administered 2016-11-08 – 2016-11-10 (×3): 5 mg via ORAL
  Filled 2016-11-08 (×3): qty 1

## 2016-11-08 NOTE — Progress Notes (Signed)
[facility] [facility] [place] NAME: [name]    MR#:  [id number]  DATE OF BIRTH:  1970-06-14  SUBJECTIVE:   POD #3 Total abdominal hysterectomy. Having some pain at surgical site. Tolerating diet. UOP >700 cc today REVIEW OF SYSTEMS:   Review of Systems  Constitutional: Negative for chills, fever and weight loss.  HENT: Negative for ear discharge, ear pain and nosebleeds.   Eyes: Negative for blurred vision, pain and discharge.  Respiratory: Negative for sputum production, shortness of breath, wheezing and stridor.   Cardiovascular: Negative for chest pain, palpitations, orthopnea and PND.  Gastrointestinal: Positive for abdominal pain. Negative for diarrhea, nausea and vomiting.  Genitourinary: Negative for frequency and urgency.  Musculoskeletal: Negative for back pain and joint pain.  Neurological: Positive for weakness. Negative for sensory change, speech change and focal weakness.  Psychiatric/Behavioral: Negative for depression and hallucinations. [name] is not nervous/anxious.    Tolerating Diet:yes Tolerating PT: yes  DRUG ALLERGIES:   Allergies  Allergen Reactions  . Ace Inhibitors Cough  . Latex Swelling    VITALS:  Blood pressure (!) 159/86, pulse 86, temperature 98.3 F (36.8 C), temperature source Oral, resp. rate 20, height 5\' 3"  (1.6 m), weight 113.4 kg (250 lb), SpO2 99 %.  PHYSICAL EXAMINATION:   Physical Exam  GENERAL:  46 y.o.-year-old Estes lying in Lauren bed with no acute distress.  EYES: Pupils equal, round, reactive to light and accommodation. No scleral icterus. Extraocular muscles intact.  HEENT: Head atraumatic, normocephalic. Oropharynx and nasopharynx clear.  NECK:  Supple, no jugular venous distention. No thyroid enlargement, no tenderness.  LUNGS: Normal breath sounds bilaterally, no wheezing, rales, rhonchi. No use of accessory muscles of respiration.  CARDIOVASCULAR: S1, S2 normal. No  murmurs, rubs, or gallops.  ABDOMEN: Soft, nontender, nondistended. Bowel sounds present. No organomegaly or mass.  Midline surgical dressing+ foley EXTREMITIES: No cyanosis, clubbing or edema b/l.    NEUROLOGIC: Cranial nerves II through XII are intact. No focal Motor or sensory deficits b/l.   PSYCHIATRIC:  Estes is alert and oriented x 3.  SKIN: No obvious rash, lesion, or ulcer.   LABORATORY PANEL:  CBC  Recent Labs Lab 11/07/16 0544  WBC 10.7  HGB 10.1*  HCT 30.8*  PLT 289    Chemistries   Recent Labs Lab 11/07/16 0544 11/08/16 1355  NA 140 136  K 4.8 4.3  CL 112* 107  CO2 23 22  GLUCOSE 91 126*  BUN 20 25*  CREATININE 2.26* 2.32*  CALCIUM 9.1 8.9  AST 56*  --   ALT 41  --   ALKPHOS 49  --   BILITOT 0.5  --    Cardiac Enzymes  Recent Labs Lab 11/05/16 0327  TROPONINI <0.03   RADIOLOGY:  No results found. ASSESSMENT AND PLAN:   * Uncontrolled hypertension BP much improved Presently on metoprolol and hydralazine. Add amlodipine  * CKD stage III is stable -creat a bit up 1.68--2.26--2.3 -cont IVF and I and O -UOP improving/ Change to NS -if no improvement will get nephrology involve  * Fibroid uterus. Status post Total abdominal hysterectomy, Left Salpingectomy  Estes has significant blood loss(2100) but received  PRBC's  Case discussed with Care Management/Social Worker. Management plans discussed with [name], family and they are in agreement.  CODE STATUS: full  DVT Prophylaxis: ambulation  TOTAL TIME TAKING CARE OF THIS Estes: *30* minutes.  >50% time spent on counselling and coordination of care  POSSIBLE D/C IN 1-2 DAYS, DEPENDING ON CLINICAL CONDITION.  Note: This dictation was prepared with Dragon dictation along with smaller phrase technology. Any transcriptional errors that result from this process are unintentional.  Gannon Heinzman M.D on 11/08/2016 at 2:40 PM  Between 7am to 6pm - Pager - [phone]  After 6pm  go to [email or web address] - password [email or web address] [facility]  Office  [phone]  CC: Primary care physician; Center, [facility]

## 2016-11-08 NOTE — Progress Notes (Signed)
2 Days Post-Op       Procedure(s): HYSTERECTOMY ABDOMINAL WITH SALPINGECTOMY (Left) CYSTOSCOPY with stent placement (Bilateral) Subjective: The patient is doing well.  No nausea or vomiting. Pain is adequately controlled po meds. She is tolerating a regular diet in the morning.  Objective: Vital signs in last 24 hours: Temp:  [97.5 F (36.4 C)-98.8 F (37.1 C)] 98.5 F (36.9 C) (07/11 1622) Pulse Rate:  [75-90] 86 (07/11 1622) Resp:  [18-20] 18 (07/11 1622) BP: (122-159)/(72-86) 122/85 (07/11 1622) SpO2:  [99 %] 99 % (07/11 1149)  Intake/Output  Intake/Output Summary (Last 24 hours) at 11/08/16 1745 Last data filed at 11/08/16 1533  Gross per 24 hour  Intake          3133.92 ml  Output             1230 ml  Net          1903.92 ml    Physical Exam:  General: Alert and oriented. CV: RRR Lungs: Clear bilaterally. GI: Soft, Nondistended. Incisions: Clean and dry. Urine: Clear, Foley in place Extremities: Nontender, no erythema, no edema.  Lab Results:  Recent Labs  11/07/16 0544  HGB 10.1*  HCT 30.8*  WBC 10.7  PLT 289                 Results for orders placed or performed during the hospital encounter of 11/04/16 (from the past 24 hour(s))  Basic metabolic panel     Status: Abnormal   Collection Time: 11/08/16  1:55 PM  Result Value Ref Range   Sodium 136 135 - 145 mmol/L   Potassium 4.3 3.5 - 5.1 mmol/L   Chloride 107 101 - 111 mmol/L   CO2 22 22 - 32 mmol/L   Glucose, Bld 126 (H) 65 - 99 mg/dL   BUN 25 (H) 6 - 20 mg/dL   Creatinine, Ser 2.32 (H) 0.44 - 1.00 mg/dL   Calcium 8.9 8.9 - 10.3 mg/dL   GFR calc non Af Amer 24 (L) >60 mL/min   GFR calc Af Amer 28 (L) >60 mL/min   Anion gap 7 5 - 15    Assessment/Plan: 2 Days Post-Op       Procedure(s): HYSTERECTOMY ABDOMINAL WITH SALPINGECTOMY (Left) CYSTOSCOPY with stent placement (Bilateral) and stent removal - AKI: follow Cr after surgery, keep hydrated. Appreciate hospitalist assistance with chronic  kidney disease. Consider renal consult with increasing Cr - Anemia: Stable after surgery - eLevated liver enzymes: improving - Borderline urine output - postop care:  1) Ambulate, Incentive spirometry - out of bed to chair for 4 hours, with ambulation 2) Advance diet as tolerated 3) continue ERAS without ibuprofen 4) D/C Foley in am 5) Hold NSAIDS for declining kidney function 6) Consult dentistry if available  Benjaman Kindler, MD   LOS: 4 days   Benjaman Kindler 11/08/2016, 5:45 PM

## 2016-11-08 NOTE — Progress Notes (Signed)
[place] at [place] NAME: [name]    MR#:  [id number]  DATE OF BIRTH:  July 12, 1970  SUBJECTIVE:   POD #3 Total abdominal hysterectomy. Having some pain at surgical site. Tolerating diet. UOP >700 cc today REVIEW OF SYSTEMS:   Review of Systems  Constitutional: Negative for chills, fever and weight loss.  HENT: Negative for ear discharge, ear pain and nosebleeds.   Eyes: Negative for blurred vision, pain and discharge.  Respiratory: Negative for sputum production, shortness of breath, wheezing and stridor.   Cardiovascular: Negative for chest pain, palpitations, orthopnea and PND.  Gastrointestinal: Positive for abdominal pain. Negative for diarrhea, nausea and vomiting.  Genitourinary: Negative for frequency and urgency.  Musculoskeletal: Negative for back pain and joint pain.  Neurological: Positive for weakness. Negative for sensory change, speech change and focal weakness.  Psychiatric/Behavioral: Negative for depression and hallucinations. The patient is not nervous/anxious.    Tolerating Diet:yes Tolerating PT: yes  DRUG ALLERGIES:   Allergies  Allergen Reactions  . Ace Inhibitors Cough  . Latex Swelling    VITALS:  Blood pressure (!) 159/86, pulse 86, temperature 98.3 F (36.8 C), temperature source Oral, resp. rate 20, height 5\' 3"  (1.6 m), weight 113.4 kg (250 lb), SpO2 99 %.  PHYSICAL EXAMINATION:   Physical Exam  GENERAL:  46 y.o.-year-old patient lying in the bed with no acute distress.  EYES: Pupils equal, round, reactive to light and accommodation. No scleral icterus. Extraocular muscles intact.  HEENT: Head atraumatic, normocephalic. Oropharynx and nasopharynx clear.  NECK:  Supple, no jugular venous distention. No thyroid enlargement, no tenderness.  LUNGS: Normal breath sounds bilaterally, no wheezing, rales, rhonchi. No use of accessory muscles of respiration.  CARDIOVASCULAR: S1, S2 normal. No  murmurs, rubs, or gallops.  ABDOMEN: Soft, nontender, nondistended. Bowel sounds present. No organomegaly or mass.  Midline surgical dressing+ foley EXTREMITIES: No cyanosis, clubbing or edema b/l.    NEUROLOGIC: Cranial nerves II through XII are intact. No focal Motor or sensory deficits b/l.   PSYCHIATRIC:  patient is alert and oriented x 3.  SKIN: No obvious rash, lesion, or ulcer.   LABORATORY PANEL:  CBC  Recent Labs Lab 11/07/16 0544  WBC 10.7  HGB 10.1*  HCT 30.8*  PLT 289    Chemistries   Recent Labs Lab 11/07/16 0544  NA 140  K 4.8  CL 112*  CO2 23  GLUCOSE 91  BUN 20  CREATININE 2.26*  CALCIUM 9.1  AST 56*  ALT 41  ALKPHOS 49  BILITOT 0.5   Cardiac Enzymes  Recent Labs Lab 11/05/16 0327  TROPONINI <0.03   RADIOLOGY:  No results found. ASSESSMENT AND PLAN:   * Uncontrolled hypertension BP much improved Presently on metoprolol and hydralazine. Add amlodipine  * CKD stage III is stable -creat a bit up -cont IVF and I and O -UOP improving/ Check BMP today  * Fibroid uterus. Status post Total abdominal hysterectomy, Left Salpingectomy  Patient has significant blood loss(2100) but received IV fluids and 570 mL pRBC.  Case discussed with Care Management/Social Worker. Management plans discussed with the patient, family and they are in agreement.  CODE STATUS: full  DVT Prophylaxis: ambulation  TOTAL TIME TAKING CARE OF THIS PATIENT: *30* minutes.  >50% time spent on counselling and coordination of care  POSSIBLE D/C IN 1-2 DAYS, DEPENDING ON CLINICAL CONDITION.  Note: This dictation was prepared with Dragon dictation along with smaller phrase technology. Any transcriptional  errors that result from this process are unintentional.  Cloys Vera M.D on 11/08/2016 at 1:29 PM  Between 7am to 6pm - Pager - [phone]  After 6pm go to [email or web address] - password [email or web address] [place]  Office  [phone]  CC: Primary  care physician; Center, [facility]

## 2016-11-09 LAB — COMPREHENSIVE METABOLIC PANEL
ALBUMIN: 2 g/dL — AB (ref 3.5–5.0)
ALT: 21 U/L (ref 14–54)
AST: 29 U/L (ref 15–41)
Alkaline Phosphatase: 50 U/L (ref 38–126)
Anion gap: 7 (ref 5–15)
BUN: 21 mg/dL — AB (ref 6–20)
CHLORIDE: 109 mmol/L (ref 101–111)
CO2: 22 mmol/L (ref 22–32)
CREATININE: 1.79 mg/dL — AB (ref 0.44–1.00)
Calcium: 8.8 mg/dL — ABNORMAL LOW (ref 8.9–10.3)
GFR calc Af Amer: 38 mL/min — ABNORMAL LOW (ref >60)
GFR, EST NON AFRICAN AMERICAN: 33 mL/min — AB (ref >60)
GLUCOSE: 109 mg/dL — AB (ref 65–99)
POTASSIUM: 3.8 mmol/L (ref 3.5–5.1)
SODIUM: 138 mmol/L (ref 135–145)
Total Bilirubin: 0.3 mg/dL (ref 0.3–1.2)
Total Protein: 5.4 g/dL — ABNORMAL LOW (ref 6.5–8.1)

## 2016-11-09 LAB — CBC
HCT: 25 % — ABNORMAL LOW (ref 35.0–47.0)
Hemoglobin: 8.2 g/dL — ABNORMAL LOW (ref 12.0–16.0)
MCH: 25.4 pg — AB (ref 26.0–34.0)
MCHC: 32.9 g/dL (ref 32.0–36.0)
MCV: 77 fL — AB (ref 80.0–100.0)
PLATELETS: 241 10*3/uL (ref 150–440)
RBC: 3.25 MIL/uL — ABNORMAL LOW (ref 3.80–5.20)
RDW: 20.8 % — AB (ref 11.5–14.5)
WBC: 10.4 10*3/uL (ref 3.6–11.0)

## 2016-11-09 LAB — SURGICAL PATHOLOGY

## 2016-11-09 MED ORDER — SODIUM CHLORIDE 0.9 % IV BOLUS (SEPSIS)
1000.0000 mL | Freq: Once | INTRAVENOUS | Status: AC
  Administered 2016-11-09: 1000 mL via INTRAVENOUS

## 2016-11-09 MED ORDER — ATORVASTATIN CALCIUM 20 MG PO TABS
40.0000 mg | ORAL_TABLET | Freq: Every day | ORAL | Status: DC
  Administered 2016-11-09: 40 mg via ORAL
  Filled 2016-11-09 (×2): qty 2

## 2016-11-09 NOTE — Progress Notes (Signed)
Pt complaining of black vaginal discharge. I did not see the discharge, but asked pt to show it to me next time she uses the restroom. Dr. Leafy Ro made aware, no new orders received.

## 2016-11-09 NOTE — Progress Notes (Signed)
[place] at [place] NAME: Eleena Grater    MR#:  [id number]  DATE OF BIRTH:  [birth year]  SUBJECTIVE:   POD #4  Total abdominal hysterectomy.  Tolerating diet. UOP >2030 cc over 24 hour REVIEW OF SYSTEMS:   Review of Systems  Constitutional: Negative for chills, fever and weight loss.  HENT: Negative for ear discharge, ear pain and nosebleeds.   Eyes: Negative for blurred vision, pain and discharge.  Respiratory: Negative for sputum production, shortness of breath, wheezing and stridor.   Cardiovascular: Negative for chest pain, palpitations, orthopnea and PND.  Gastrointestinal: Positive for abdominal pain. Negative for diarrhea, nausea and vomiting.  Genitourinary: Negative for frequency and urgency.  Musculoskeletal: Negative for back pain and joint pain.  Neurological: Positive for weakness. Negative for sensory change, speech change and focal weakness.  Psychiatric/Behavioral: Negative for depression and hallucinations. The patient is not nervous/anxious.    Tolerating Diet:yes Tolerating PT: yes  DRUG ALLERGIES:   Allergies  Allergen Reactions  . Ace Inhibitors Cough  . Latex Swelling    VITALS:  Blood pressure (!) 143/65, pulse 74, temperature 98.6 F (37 C), temperature source Oral, resp. rate 18, height 5\' 3"  (1.6 m), weight 113.4 kg (250 lb), SpO2 100 %.  PHYSICAL EXAMINATION:   Physical Exam  GENERAL:  46 y.o.-year-old patient lying in the bed with no acute distress.  EYES: Pupils equal, round, reactive to light and accommodation. No scleral icterus. Extraocular muscles intact.  HEENT: Head atraumatic, normocephalic. Oropharynx and nasopharynx clear.  NECK:  Supple, no jugular venous distention. No thyroid enlargement, no tenderness.  LUNGS: Normal breath sounds bilaterally, no wheezing, rales, rhonchi. No use of accessory muscles of respiration.  CARDIOVASCULAR: S1, S2 normal. No murmurs, rubs, or  gallops.  ABDOMEN: Soft, nontender, nondistended. Bowel sounds present. No organomegaly or mass.  Midline surgical dressing+ foley EXTREMITIES: No cyanosis, clubbing or edema b/l.    NEUROLOGIC: Cranial nerves II through XII are intact. No focal Motor or sensory deficits b/l.   PSYCHIATRIC:  patient is alert and oriented x 3.  SKIN: No obvious rash, lesion, or ulcer.   LABORATORY PANEL:  CBC  Recent Labs Lab 11/09/16 0517  WBC 10.4  HGB 8.2*  HCT 25.0*  PLT 241    Chemistries   Recent Labs Lab 11/09/16 0517  NA 138  K 3.8  CL 109  CO2 22  GLUCOSE 109*  BUN 21*  CREATININE 1.79*  CALCIUM 8.8*  AST 29  ALT 21  ALKPHOS 50  BILITOT 0.3   Cardiac Enzymes  Recent Labs Lab 11/05/16 0327  TROPONINI <0.03   RADIOLOGY:  No results found. ASSESSMENT AND PLAN:   * Uncontrolled hypertension BP much improved Low salt diet Presently on metoprolol and hydralazine. Added amlodipine  * CKD stage III is stable -creat a bit up 1.68--2.26--2.3--1.79 (near baseline) -UOP improving/ -d/c foley  * Fibroid uterus. Status post Total abdominal hysterectomy, Left Salpingectomy  Patient has significant blood loss(2100) but received  PRBC's  Overall at baseline. Ok to d/c from medical standpoint. Thank you for the consult.  Prescription in the chart for BP meds.  Case discussed with Care Management/Social Worker. Management plans discussed with the patient, family and they are in agreement.  CODE STATUS: full  DVT Prophylaxis: ambulation  TOTAL TIME TAKING CARE OF THIS PATIENT: *30* minutes.  >50% time spent on counselling and coordination of care   Note: This dictation was prepared with Dragon dictation  along with smaller phrase technology. Any transcriptional errors that result from this process are unintentional.  Maribelle Hopple M.D on 11/09/2016 at 7:43 AM  Between 7am to 6pm - Pager - [phone]  After 6pm go to [email or web address] - password [email or web address] [place]  Office  [phone]  CC: Primary care physician; Center, [facility]

## 2016-11-09 NOTE — Progress Notes (Signed)
After receiving the 1L bolus of NS, pt states she is feeling less dizzy, but she is nervous about being discharged. I talked to Dr. Leafy Ro and she stated she would keep pt overnight. I also spoke to Dr. Leafy Ro about the pt receiving her hydralazine this afternoon, and Dr. Leafy Ro stated we could hold it, but to continue to monitor pt's BP.

## 2016-11-09 NOTE — Care Management (Signed)
Received referral for help with outpatient health care. Spoke with Nurse Judson Roch, Ms. Sunderland will need help with medications and dental care. Will give application to Medication Management and Local [facility] for Dental Services. Possible discharge today Shelbie Ammons RN MSN CCM Care Management [phone]

## 2016-11-09 NOTE — Progress Notes (Signed)
Pt ambulated in hallway with nurse technician around nurses desk x 1.

## 2016-11-09 NOTE — Progress Notes (Signed)
3 Days Post-Op       Procedure(s): HYSTERECTOMY ABDOMINAL WITH SALPINGECTOMY (Left) CYSTOSCOPY with stent placement (Bilateral) Subjective: The patient is doing well.  No nausea or vomiting. Pain is adequately controlled po meds. She is tolerating a regular diet in the morning. She is slightly dizzy on ambulation which improved with bolus. Urine output improved >2082ml in 24hrs. Creatinine lower today than yesterday.  Objective: Vital signs in last 24 hours: Temp:  [98.5 F (36.9 C)-99.8 F (37.7 C)] 98.7 F (37.1 C) (07/12 0815) Pulse Rate:  [74-103] 77 (07/12 1050) Resp:  [16-20] 16 (07/12 1050) BP: (122-152)/(65-85) 133/75 (07/12 1050) SpO2:  [99 %-100 %] 100 % (07/12 1050)  Intake/Output  Intake/Output Summary (Last 24 hours) at 11/09/16 1347 Last data filed at 11/09/16 1200  Gross per 24 hour  Intake           523.33 ml  Output             1630 ml  Net         -1106.67 ml    Physical Exam:  General: Alert and oriented. CV: RRR Lungs: Clear bilaterally. GI: Soft, Nondistended. Incisions: Clean and dry. Urine: Clear, Foley in place Extremities: Nontender, no erythema, no edema.  Lab Results:  Recent Labs  11/07/16 0544 11/09/16 0517  HGB 10.1* 8.2*  HCT 30.8* 25.0*  WBC 10.7 10.4  PLT 289 241                 Results for orders placed or performed during the hospital encounter of 11/04/16 (from the past 24 hour(s))  Basic metabolic panel     Status: Abnormal   Collection Time: 11/08/16  1:55 PM  Result Value Ref Range   Sodium 136 135 - 145 mmol/L   Potassium 4.3 3.5 - 5.1 mmol/L   Chloride 107 101 - 111 mmol/L   CO2 22 22 - 32 mmol/L   Glucose, Bld 126 (H) 65 - 99 mg/dL   BUN 25 (H) 6 - 20 mg/dL   Creatinine, Ser 2.32 (H) 0.44 - 1.00 mg/dL   Calcium 8.9 8.9 - 10.3 mg/dL   GFR calc non Af Amer 24 (L) >60 mL/min   GFR calc Af Amer 28 (L) >60 mL/min   Anion gap 7 5 - 15  Comprehensive metabolic panel     Status: Abnormal   Collection Time: 11/09/16   5:17 AM  Result Value Ref Range   Sodium 138 135 - 145 mmol/L   Potassium 3.8 3.5 - 5.1 mmol/L   Chloride 109 101 - 111 mmol/L   CO2 22 22 - 32 mmol/L   Glucose, Bld 109 (H) 65 - 99 mg/dL   BUN 21 (H) 6 - 20 mg/dL   Creatinine, Ser 1.79 (H) 0.44 - 1.00 mg/dL   Calcium 8.8 (L) 8.9 - 10.3 mg/dL   Total Protein 5.4 (L) 6.5 - 8.1 g/dL   Albumin 2.0 (L) 3.5 - 5.0 g/dL   AST 29 15 - 41 U/L   ALT 21 14 - 54 U/L   Alkaline Phosphatase 50 38 - 126 U/L   Total Bilirubin 0.3 0.3 - 1.2 mg/dL   GFR calc non Af Amer 33 (L) >60 mL/min   GFR calc Af Amer 38 (L) >60 mL/min   Anion gap 7 5 - 15  CBC     Status: Abnormal   Collection Time: 11/09/16  5:17 AM  Result Value Ref Range   WBC 10.4 3.6 - 11.0 K/uL  RBC 3.25 (L) 3.80 - 5.20 MIL/uL   Hemoglobin 8.2 (L) 12.0 - 16.0 g/dL   HCT 25.0 (L) 35.0 - 47.0 %   MCV 77.0 (L) 80.0 - 100.0 fL   MCH 25.4 (L) 26.0 - 34.0 pg   MCHC 32.9 32.0 - 36.0 g/dL   RDW 20.8 (H) 11.5 - 14.5 %   Platelets 241 150 - 440 K/uL    Assessment/Plan: 3 Days Post-Op       Procedure(s): HYSTERECTOMY ABDOMINAL WITH SALPINGECTOMY (Left) CYSTOSCOPY with stent placement (Bilateral) and stent removal - AKI: follow Cr after surgery, keep hydrated. Appreciate hospitalist assistance with chronic kidney disease and HTN, which is improving and may be reason for orthostatics - Anemia: Stable after surgery - eLevated liver enzymes: improving - Improved urine output - postop care:  1) Ambulate, Incentive spirometry - out of bed to chair for 4 hours, with ambulation 2) Advance diet as tolerated 3) continue ERAS without ibuprofen 5) Hold NSAIDS for declining kidney function 6) Consult dentistry if available - case management consult placed  Benjaman Kindler, MD   LOS: 5 days   Benjaman Kindler 11/09/2016, 1:47 PMPatient ID: [name], female   DOB: 1971-01-18, 46 y.o.   MRN: [id number]

## 2016-11-09 NOTE — Progress Notes (Signed)
Pt states that she feels "like her vision is going to go out". Upon further investigation she states that she feels slightly off balance and at times feels like she might faint. VS taken: 133/75, 77, 16, 100% on RA. She is currently sitting in the chair, I notified the pt that I would let the doctor know, and to not get up from the chair without help. Both the telephone and call bell are within reach. Pt agreed. Dr. Leafy Ro notified, per her will order orthostatic vital signs and a 1L bolus of fluid.

## 2016-11-09 NOTE — Progress Notes (Addendum)
Foley catheter removed.   Update 1100: Pt has voided since catheter removal.

## 2016-11-10 LAB — COMPREHENSIVE METABOLIC PANEL
ALBUMIN: 2.2 g/dL — AB (ref 3.5–5.0)
ALK PHOS: 59 U/L (ref 38–126)
ALT: 17 U/L (ref 14–54)
ANION GAP: 5 (ref 5–15)
AST: 19 U/L (ref 15–41)
BUN: 19 mg/dL (ref 6–20)
CALCIUM: 9.4 mg/dL (ref 8.9–10.3)
CHLORIDE: 110 mmol/L (ref 101–111)
CO2: 22 mmol/L (ref 22–32)
Creatinine, Ser: 1.38 mg/dL — ABNORMAL HIGH (ref 0.44–1.00)
GFR calc non Af Amer: 45 mL/min — ABNORMAL LOW (ref >60)
GFR, EST AFRICAN AMERICAN: 53 mL/min — AB (ref >60)
Glucose, Bld: 101 mg/dL — ABNORMAL HIGH (ref 65–99)
POTASSIUM: 3.7 mmol/L (ref 3.5–5.1)
SODIUM: 137 mmol/L (ref 135–145)
Total Bilirubin: 0.3 mg/dL (ref 0.3–1.2)
Total Protein: 6.4 g/dL — ABNORMAL LOW (ref 6.5–8.1)

## 2016-11-10 LAB — CBC
HCT: 27.5 % — ABNORMAL LOW (ref 35.0–47.0)
Hemoglobin: 8.7 g/dL — ABNORMAL LOW (ref 12.0–16.0)
MCH: 24.6 pg — AB (ref 26.0–34.0)
MCHC: 31.6 g/dL — AB (ref 32.0–36.0)
MCV: 78 fL — ABNORMAL LOW (ref 80.0–100.0)
PLATELETS: 337 10*3/uL (ref 150–440)
RBC: 3.52 MIL/uL — ABNORMAL LOW (ref 3.80–5.20)
RDW: 21.6 % — AB (ref 11.5–14.5)
WBC: 9.9 10*3/uL (ref 3.6–11.0)

## 2016-11-10 MED ORDER — OXYCODONE HCL 5 MG PO CAPS: 5.0000 mg | ORAL_CAPSULE | Freq: Four times a day (QID) | ORAL | 0 refills | Status: DC | PRN

## 2016-11-10 MED ORDER — METOPROLOL TARTRATE 100 MG PO TABS: 100.0000 mg | ORAL_TABLET | Freq: Two times a day (BID) | ORAL | 4 refills | Status: DC

## 2016-11-10 MED ORDER — ACETAMINOPHEN 500 MG PO TABS: 1000.0000 mg | ORAL_TABLET | Freq: Four times a day (QID) | ORAL | 0 refills | Status: DC

## 2016-11-10 MED ORDER — HYDRALAZINE HCL 100 MG PO TABS: 100.0000 mg | ORAL_TABLET | Freq: Three times a day (TID) | ORAL | 4 refills | Status: DC

## 2016-11-10 MED ORDER — AMLODIPINE BESYLATE 5 MG PO TABS: 5.0000 mg | ORAL_TABLET | Freq: Every day | ORAL | 3 refills | Status: DC

## 2016-11-10 NOTE — Discharge Summary (Signed)
GynecologicalDischarge Summary  Patient Name: [name] DOB: 01-12-1971 MRN: [id number]  Date of Admission: 11/04/2016 Date of Discharge: 11/10/2016  Hospital course:   The patient was admitted from the ER with a plan for TAH/BS with a day prior inpatient to see cardiology, as NSTEMI in March noted on her chart. She proceeded to the operating room as scheduled for the below stated procedure without complications, [id number].  (For complete operative information, please see operative report).  Postoperatively she was admitted to the floor. Pain was initially managed with PCA which was transitioned to PO  when the patient was tolerating a regular diet on postoperative day number 1.  Postoperative labs were stable, with an initial bump in creatinine and resolution by day of discharge. Her LFTs came down to normal. She received Lovenox while postop.   Foley cath was discontinued on postoperative day number 1 and patient passed a voiding trial without difficulty.   Patient was felt to be stable for discharge on postoperative day number 4 when she was tolerating a regular diet, pain was controlled with po pain medications, and she was ambulating and voiding without difficulty. Vital signs were stable and physical exam remained benign throughout her hospital stay. Incision was clean,dry, and intact with staples. Every other staple was removed on day of discharge with plan to remove the rest in the office in 2 days.  She will follow up per below for post-op check and staple removal.  Rx given for Percocet, Zofran, and Colace and ERAS meds.    She was given specific instructions and numbers to call in written and verbal format. She verbalized understanding, agrees with the plan of care, and all questions answered to her satisfaction.  Hospitalist was consulted and titrated her BP meds. Her BP was controlled on 3 meds while in house, and these were given to her as scripts to go home with. Case management  helped with outpatient resources to get her the meds she needs, and she will return to [facility] for internal medicine management.  Discharge Physical Exam:  BP (!) 174/86 (BP Location: Right Arm)   Pulse 85   Temp 98.1 F (36.7 C) (Oral)   Resp 18   Ht 5\' 3"  (1.6 m)   Wt 250 lb (113.4 kg)   SpO2 100%   BMI 44.29 kg/m   General: NAD CV: RRR Pulm: CTABL, nl effort ABD: s/nd/nt Incision: c/d/i  DVT Evaluation: LE non-ttp, no evidence of DVT on exam.  Hemoglobin  Date Value Ref Range Status  11/09/2016 8.2 (L) 12.0 - 16.0 g/dL Final   HCT  Date Value Ref Range Status  11/09/2016 25.0 (L) 35.0 - 47.0 % Final      Plan:  [name] was discharged to home in good condition. Follow-up appointment at [place] in 2 weeks   Discharge Medications: Allergies as of 11/10/2016      Reactions   Ace Inhibitors Cough   Latex Swelling      Medication List    STOP taking these medications   furosemide 20 MG tablet Commonly known as:  LASIX   norethindrone-ethinyl estradiol 1/35 tablet Commonly known as:  ORTHO-NOVUM 1/35 (28)     TAKE these medications   acetaminophen 500 MG tablet Commonly known as:  TYLENOL Take 2 tablets (1,000 mg total) by mouth every 6 (six) hours.   amLODipine 5 MG tablet Commonly known as:  NORVASC Take 1 tablet (5 mg total) by mouth daily.   aspirin  325 MG tablet Take 1 tablet (325 mg total) by mouth daily. What changed:  how much to take  when to take this   atorvastatin 40 MG tablet Commonly known as:  LIPITOR Take 1 tablet (40 mg total) by mouth daily at 6 PM.   docusate sodium 100 MG capsule Commonly known as:  COLACE Take 1 capsule (100 mg total) by mouth 2 (two) times daily.   hydrALAZINE 100 MG tablet Commonly known as:  APRESOLINE Take 1 tablet (100 mg total) by mouth 3 (three) times daily. What changed:  when to take this   metoprolol tartrate 100 MG tablet Commonly known as:  LOPRESSOR Take 1  tablet (100 mg total) by mouth 2 (two) times daily.   ondansetron 4 MG tablet Commonly known as:  ZOFRAN Take 1 tablet (4 mg total) by mouth every 6 (six) hours as needed for nausea.   oxycodone 5 MG capsule Commonly known as:  OXY-IR Take 1 capsule (5 mg total) by mouth every 6 (six) hours as needed for pain.         Signed: Benjaman Kindler, MD 1:57 PM

## 2016-11-10 NOTE — Progress Notes (Signed)
Pt discharged home.  Discharge instructions, prescriptions and follow up appointment given to and reviewed with pt. Incision cleaning kit given and explained to patient.  Pt verbalized understanding.  Escorted by auxillary.

## 2017-01-08 ENCOUNTER — Encounter: Payer: Self-pay | Admitting: Emergency Medicine

## 2017-01-08 ENCOUNTER — Emergency Department: Payer: BLUE CROSS/BLUE SHIELD

## 2017-01-08 ENCOUNTER — Emergency Department
Admission: EM | Admit: 2017-01-08 | Discharge: 2017-01-08 | Disposition: A | Discharge status: Home / Self Care | Payer: BLUE CROSS/BLUE SHIELD | Attending: Emergency Medicine | Admitting: Emergency Medicine

## 2017-01-08 DIAGNOSIS — R202 Paresthesia of skin: Secondary | ICD-10-CM

## 2017-01-08 DIAGNOSIS — I2510 Atherosclerotic heart disease of native coronary artery without angina pectoris: Secondary | ICD-10-CM | POA: Insufficient documentation

## 2017-01-08 DIAGNOSIS — N189 Chronic kidney disease, unspecified: Secondary | ICD-10-CM | POA: Insufficient documentation

## 2017-01-08 DIAGNOSIS — I129 Hypertensive chronic kidney disease with stage 1 through stage 4 chronic kidney disease, or unspecified chronic kidney disease: Secondary | ICD-10-CM | POA: Insufficient documentation

## 2017-01-08 LAB — DIFFERENTIAL
BASOS ABS: 0.1 10*3/uL (ref 0–0.1)
Basophils Relative: 1 %
EOS ABS: 0.3 10*3/uL (ref 0–0.7)
Eosinophils Relative: 4 %
LYMPHS ABS: 2.2 10*3/uL (ref 1.0–3.6)
Lymphocytes Relative: 27 %
Monocytes Absolute: 0.6 10*3/uL (ref 0.2–0.9)
Monocytes Relative: 7 %
NEUTROS PCT: 61 %
Neutro Abs: 4.9 10*3/uL (ref 1.4–6.5)

## 2017-01-08 LAB — CBC
HCT: 30.5 % — ABNORMAL LOW (ref 35.0–47.0)
HEMOGLOBIN: 9.8 g/dL — AB (ref 12.0–16.0)
MCH: 24.1 pg — ABNORMAL LOW (ref 26.0–34.0)
MCHC: 32.1 g/dL (ref 32.0–36.0)
MCV: 75 fL — ABNORMAL LOW (ref 80.0–100.0)
Platelets: 321 10*3/uL (ref 150–440)
RBC: 4.07 MIL/uL (ref 3.80–5.20)
RDW: 19.2 % — ABNORMAL HIGH (ref 11.5–14.5)
WBC: 8.1 10*3/uL (ref 3.6–11.0)

## 2017-01-08 LAB — TROPONIN I: Troponin I: <0.03 ng/mL (ref <0.03)

## 2017-01-08 LAB — GLUCOSE, CAPILLARY: Glucose-Capillary: 70 mg/dL (ref 65–99)

## 2017-01-08 LAB — APTT: APTT: 38 s — AB (ref 24–36)

## 2017-01-08 LAB — COMPREHENSIVE METABOLIC PANEL
ALBUMIN: 3.6 g/dL (ref 3.5–5.0)
ALT: 12 U/L — ABNORMAL LOW (ref 14–54)
AST: 15 U/L (ref 15–41)
Alkaline Phosphatase: 103 U/L (ref 38–126)
Anion gap: 9 (ref 5–15)
BILIRUBIN TOTAL: 0.5 mg/dL (ref 0.3–1.2)
BUN: 25 mg/dL — AB (ref 6–20)
CHLORIDE: 105 mmol/L (ref 101–111)
CO2: 25 mmol/L (ref 22–32)
Calcium: 10.9 mg/dL — ABNORMAL HIGH (ref 8.9–10.3)
Creatinine, Ser: 1.9 mg/dL — ABNORMAL HIGH (ref 0.44–1.00)
GFR calc Af Amer: 35 mL/min — ABNORMAL LOW (ref >60)
GFR calc non Af Amer: 31 mL/min — ABNORMAL LOW (ref >60)
GLUCOSE: 80 mg/dL (ref 65–99)
POTASSIUM: 3.6 mmol/L (ref 3.5–5.1)
Sodium: 139 mmol/L (ref 135–145)
TOTAL PROTEIN: 7.9 g/dL (ref 6.5–8.1)

## 2017-01-08 LAB — PROTIME-INR
INR: 1
Prothrombin Time: 13.1 seconds (ref 11.4–15.2)

## 2017-01-08 NOTE — ED Notes (Signed)
Pt states no pain, only tingling

## 2017-01-08 NOTE — ED Provider Notes (Signed)
[facility] Emergency Department Provider Note       Time seen: ----------------------------------------- 1:20 PM on 01/08/2017 -----------------------------------------     I have reviewed the triage vital signs and the nursing notes.   HISTORY   Chief Complaint Tingling    HPI [name] is a 46 y.o. female who presents to the ED for left arm tingling and numbness all she was working at [employer]. Patient states they were understaffed and she thinks this may have caused her left arm tingling. 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 She denies any recent illness or other complaints at this point. She states she has been to the ER in the remote past for paresthesias.   Past Medical History:  Diagnosis Date  . Anemia   . Coronary artery disease   . Hypertension   . MI (myocardial infarction) [facility])     Patient Active Problem List   Diagnosis Date Noted  . Fibroid uterus 11/04/2016  . Iron deficiency anemia due to chronic blood loss   . NSTEMI (non-ST elevated myocardial infarction) ([place]) 07/29/2016  . Severe anemia 09/13/2015  . IRREGULAR MENSES 05/28/2007  . COUGH 04/12/2007  . OBESITY, MORBID 11/24/2006  . ANEMIA-IRON DEFICIENCY 11/24/2006  . HYPERTENSION 11/24/2006    Past Surgical History:  Procedure Laterality Date  . CYSTOSCOPY Bilateral 11/06/2016   Procedure: CYSTOSCOPY with stent placement;  Surgeon: Benjaman Kindler, MD;  Location: [facility] ORS;  Service: Gynecology;  Laterality: Bilateral;  . HYSTERECTOMY ABDOMINAL WITH SALPINGECTOMY Left 11/06/2016   Procedure: HYSTERECTOMY ABDOMINAL WITH SALPINGECTOMY;  Surgeon: Benjaman Kindler, MD;  Location: [facility] ORS;  Service: Gynecology;  Laterality: Left;  . LEFT HEART CATH AND CORONARY ANGIOGRAPHY Right 08/01/2016   Procedure: Left Heart Cath and Coronary Angiography;  Surgeon: Dionisio David, MD;  Location: [place] [facility];  Service:  Cardiovascular;  Laterality: Right;    Allergies Ace inhibitors and Latex  Social History Social History  Substance Use Topics  . Smoking status: Never Smoker  . Smokeless tobacco: Never Used  . Alcohol use No    Review of Systems Constitutional: Negative for fever. Eyes: Negative for vision changes ENT:  Negative for congestion, sore throat Cardiovascular: Negative for chest pain. Respiratory: Negative for shortness of breath. Gastrointestinal: Negative for abdominal pain, vomiting and diarrhea. Genitourinary: Negative for dysuria. Musculoskeletal: Negative for back pain. Skin: Negative for rash. Neurological: Positive for left arm tingling  All systems negative/normal/unremarkable except as stated in the HPI  ____________________________________________   PHYSICAL EXAM:  VITAL SIGNS: ED Triage Vitals  Enc Vitals Group     BP 01/08/17 1146 (!) 144/63     Pulse Rate 01/08/17 1146 (!) 50     Resp 01/08/17 1146 18     Temp 01/08/17 1146 98.1 F (36.7 C)     Temp Source 01/08/17 1146 Oral     SpO2 01/08/17 1146 99 %     Weight 01/08/17 1146 240 lb (108.9 kg)     Height 01/08/17 1146 5\' 3"  (1.6 m)     Head Circumference --      Peak Flow --      Pain Score 01/08/17 1151 6     Pain Loc --      Pain Edu? --      Excl. in [place]? --     Constitutional: Alert and oriented. Well appearing and in no distress. Eyes: Conjunctivae are normal. Normal extraocular movements. ENT  Head: Normocephalic and atraumatic.   Nose: No congestion/rhinnorhea.   Mouth/Throat: Mucous membranes are moist.   Neck: No stridor. Cardiovascular: Normal rate, regular rhythm. No murmurs, rubs, or gallops. Respiratory: Normal respiratory effort without tachypnea nor retractions. Breath sounds are clear and equal bilaterally. No wheezes/rales/rhonchi. Gastrointestinal: Soft and nontender. Normal bowel sounds Musculoskeletal: Nontender with normal range of motion in extremities. No  lower extremity tenderness nor edema. Neurologic:  Normal speech and language. Cranial nerves, strength, balance appears to be normal. Paresthesias noted to the left upper extremity Skin:  Skin is warm, dry and intact. No rash noted. Psychiatric: Mood and affect are normal. Speech and behavior are normal.  ____________________________________________  EKG: Interpreted by me. Sinus bradycardia with sinus arrhythmia, rate is 52 bpm, LVH, nonspecific T-wave changes, normal QT.  ____________________________________________  ED COURSE:  Pertinent labs & imaging results that were available during my care of the patient were reviewed by me and considered in my medical decision making (see chart for details). Patient presents for paresthesias, we will assess with labs and imaging as indicated.   Procedures ____________________________________________   LABS (pertinent positives/negatives)  Labs Reviewed  APTT - Abnormal; Notable for the following:       Result Value   aPTT 38 (*)    All other components within normal limits  CBC - Abnormal; Notable for the following:    Hemoglobin 9.8 (*)    HCT 30.5 (*)    MCV 75.0 (*)    MCH 24.1 (*)    RDW 19.2 (*)    All other components within normal limits  COMPREHENSIVE METABOLIC PANEL - Abnormal; Notable for the following:    BUN 25 (*)    Creatinine, Ser 1.90 (*)    Calcium 10.9 (*)    ALT 12 (*)    GFR calc non Af Amer 31 (*)    GFR calc Af Amer 35 (*)    All other components within normal limits  PROTIME-INR  DIFFERENTIAL  TROPONIN I  GLUCOSE, CAPILLARY  CBG MONITORING, ED    RADIOLOGY CT head IMPRESSION: Stable 12 mm calcified meningioma in high right vertex. No acute intracranial abnormality seen.  ____________________________________________  FINAL ASSESSMENT AND PLAN  Paresthesias   Plan: Patient's labs and imaging were dictated above. Patient had presented for paresthesias of uncertain etiology. She does have mild  acute on chronic renal insufficiency likely related to dehydration. She does admit to not drinking well today because she was working and she was likely under stress. Overall her exam is benign and she is stable for outpatient follow-up.   Earleen Newport, MD   Note: This note was generated in part or whole with voice recognition software. Voice recognition is usually quite accurate but there are transcription errors that can and very often do occur. I apologize for any typographical errors that were not detected and corrected.     Earleen Newport, MD 01/08/17 [phone]

## 2017-01-08 NOTE — ED Triage Notes (Signed)
States at 9:45 while at work she developed left arm tingling and numbness, denies any radiation, awake and alert in no acute distress, states she was the only one working at [employer] kind and very busy when it started

## 2017-01-08 NOTE — ED Notes (Signed)
Ct ordered per Dr. Corky Downs, no code stroke called

## 2017-01-28 ENCOUNTER — Emergency Department: Payer: Self-pay

## 2017-01-28 ENCOUNTER — Encounter: Payer: Self-pay | Admitting: Radiology

## 2017-01-28 ENCOUNTER — Emergency Department
Admission: EM | Admit: 2017-01-28 | Discharge: 2017-01-29 | Disposition: A | Discharge status: Home / Self Care | Payer: Self-pay | Attending: Emergency Medicine | Admitting: Emergency Medicine

## 2017-01-28 DIAGNOSIS — Z7982 Long term (current) use of aspirin: Secondary | ICD-10-CM | POA: Insufficient documentation

## 2017-01-28 DIAGNOSIS — I2510 Atherosclerotic heart disease of native coronary artery without angina pectoris: Secondary | ICD-10-CM | POA: Insufficient documentation

## 2017-01-28 DIAGNOSIS — Z79899 Other long term (current) drug therapy: Secondary | ICD-10-CM | POA: Insufficient documentation

## 2017-01-28 DIAGNOSIS — Z91040 Latex allergy status: Secondary | ICD-10-CM | POA: Insufficient documentation

## 2017-01-28 DIAGNOSIS — I252 Old myocardial infarction: Secondary | ICD-10-CM | POA: Insufficient documentation

## 2017-01-28 DIAGNOSIS — I10 Essential (primary) hypertension: Secondary | ICD-10-CM | POA: Insufficient documentation

## 2017-01-28 DIAGNOSIS — R109 Unspecified abdominal pain: Secondary | ICD-10-CM | POA: Insufficient documentation

## 2017-01-28 LAB — COMPREHENSIVE METABOLIC PANEL
ALK PHOS: 105 U/L (ref 38–126)
ALT: 15 U/L (ref 14–54)
ANION GAP: 8 (ref 5–15)
AST: 18 U/L (ref 15–41)
Albumin: 3.6 g/dL (ref 3.5–5.0)
BILIRUBIN TOTAL: 0.5 mg/dL (ref 0.3–1.2)
BUN: 27 mg/dL — ABNORMAL HIGH (ref 6–20)
CALCIUM: 10.4 mg/dL — AB (ref 8.9–10.3)
CO2: 25 mmol/L (ref 22–32)
Chloride: 107 mmol/L (ref 101–111)
Creatinine, Ser: 1.84 mg/dL — ABNORMAL HIGH (ref 0.44–1.00)
GFR calc non Af Amer: 32 mL/min — ABNORMAL LOW (ref >60)
GFR, EST AFRICAN AMERICAN: 37 mL/min — AB (ref >60)
Glucose, Bld: 119 mg/dL — ABNORMAL HIGH (ref 65–99)
Potassium: 3.1 mmol/L — ABNORMAL LOW (ref 3.5–5.1)
SODIUM: 140 mmol/L (ref 135–145)
TOTAL PROTEIN: 7.7 g/dL (ref 6.5–8.1)

## 2017-01-28 LAB — CBC
HCT: 31.4 % — ABNORMAL LOW (ref 35.0–47.0)
HEMOGLOBIN: 10.1 g/dL — AB (ref 12.0–16.0)
MCH: 24.2 pg — ABNORMAL LOW (ref 26.0–34.0)
MCHC: 32.1 g/dL (ref 32.0–36.0)
MCV: 75.3 fL — ABNORMAL LOW (ref 80.0–100.0)
Platelets: 299 10*3/uL (ref 150–440)
RBC: 4.17 MIL/uL (ref 3.80–5.20)
RDW: 19.9 % — ABNORMAL HIGH (ref 11.5–14.5)
WBC: 7.7 10*3/uL (ref 3.6–11.0)

## 2017-01-28 LAB — DIFFERENTIAL
Basophils Absolute: 0 10*3/uL (ref 0–0.1)
Basophils Relative: 1 %
EOS ABS: 0.3 10*3/uL (ref 0–0.7)
EOS PCT: 4 %
LYMPHS ABS: 1.9 10*3/uL (ref 1.0–3.6)
Lymphocytes Relative: 24 %
MONOS PCT: 7 %
Monocytes Absolute: 0.5 10*3/uL (ref 0.2–0.9)
NEUTROS PCT: 64 %
Neutro Abs: 5.1 10*3/uL (ref 1.4–6.5)

## 2017-01-28 LAB — URINALYSIS, COMPLETE (UACMP) WITH MICROSCOPIC
BILIRUBIN URINE: NEGATIVE
Bacteria, UA: NONE SEEN
GLUCOSE, UA: NEGATIVE mg/dL
Hgb urine dipstick: NEGATIVE
KETONES UR: NEGATIVE mg/dL
Leukocytes, UA: NEGATIVE
NITRITE: NEGATIVE
PROTEIN: 100 mg/dL — AB
RBC / HPF: NONE SEEN RBC/hpf (ref 0–5)
Specific Gravity, Urine: 1.01 (ref 1.005–1.030)
pH: 5 (ref 5.0–8.0)

## 2017-01-28 LAB — LIPASE, BLOOD: Lipase: 32 U/L (ref 11–51)

## 2017-01-28 MED ORDER — IOPAMIDOL (ISOVUE-300) INJECTION 61%
75.0000 mL | Freq: Once | INTRAVENOUS | Status: AC | PRN
  Administered 2017-01-28: 75 mL via INTRAVENOUS

## 2017-01-28 MED ORDER — IOPAMIDOL (ISOVUE-300) INJECTION 61%
30.0000 mL | Freq: Once | INTRAVENOUS | Status: AC
  Administered 2017-01-28: 30 mL via ORAL

## 2017-01-28 NOTE — ED Triage Notes (Signed)
Pt states she had a hysterectomy in July. Pt states she now has swelling and pain behind incision. Noted swelling behind mid  abd incision above umbilicus noted. Pt denies vomiting, diarrhea, fever.

## 2017-01-28 NOTE — ED Provider Notes (Addendum)
[facility] Emergency Department Provider Note   ____________________________________________   First MD Initiated Contact with Patient 01/28/17 2043     (approximate)  I have reviewed the triage vital signs and the nursing notes.   HISTORY  Chief Complaint Abdominal Pain    HPI [name] is a 46 y.o. female The reports onset of lower abdominal pain and swelling where her hysterectomy was done this July. Started early today or yesterday. She has no change in her urination and burning frequency urgency no fever no nausea vomiting or diarrhea just the pain and some swelling she says behind the scar. Pain is moderate and is not made worse by the bumps in the road it is made worse when she attempts to sit up from a laying position   Past Medical History:  Diagnosis Date  . Anemia   . Coronary artery disease   . Hypertension   . MI (myocardial infarction) [facility])     Patient Active Problem List   Diagnosis Date Noted  . Fibroid uterus 11/04/2016  . Iron deficiency anemia due to chronic blood loss   . NSTEMI (non-ST elevated myocardial infarction) ([place]) 07/29/2016  . Severe anemia 09/13/2015  . IRREGULAR MENSES 05/28/2007  . COUGH 04/12/2007  . OBESITY, MORBID 11/24/2006  . ANEMIA-IRON DEFICIENCY 11/24/2006  . HYPERTENSION 11/24/2006    Past Surgical History:  Procedure Laterality Date  . CYSTOSCOPY Bilateral 11/06/2016   Procedure: CYSTOSCOPY with stent placement;  Surgeon: Benjaman Kindler, MD;  Location: [facility] ORS;  Service: Gynecology;  Laterality: Bilateral;  . HYSTERECTOMY ABDOMINAL WITH SALPINGECTOMY Left 11/06/2016   Procedure: HYSTERECTOMY ABDOMINAL WITH SALPINGECTOMY;  Surgeon: Benjaman Kindler, MD;  Location: [facility] ORS;  Service: Gynecology;  Laterality: Left;  . LEFT HEART CATH AND CORONARY ANGIOGRAPHY Right 08/01/2016   Procedure: Left Heart Cath and Coronary Angiography;  Surgeon: Dionisio David, MD;  Location: [place] [facility];   Service: Cardiovascular;  Laterality: Right;    Prior to Admission medications   Medication Sig Start Date End Date Taking? Authorizing Provider  acetaminophen (TYLENOL) 500 MG tablet Take 2 tablets (1,000 mg total) by mouth every 6 (six) hours. 11/10/16   Benjaman Kindler, MD  amLODipine (NORVASC) 5 MG tablet Take 1 tablet (5 mg total) by mouth daily. 11/11/16   Benjaman Kindler, MD  aspirin 325 MG tablet Take 1 tablet (325 mg total) by mouth daily. Patient taking differently: Take 81 mg by mouth 2 (two) times daily.  08/03/16   Demetrios Loll, MD  atorvastatin (LIPITOR) 40 MG tablet Take 1 tablet (40 mg total) by mouth daily at 6 PM. 08/02/16   Demetrios Loll, MD  docusate sodium (COLACE) 100 MG capsule Take 1 capsule (100 mg total) by mouth 2 (two) times daily. Patient not taking: Reported on 07/29/2016 09/14/15   Schermerhorn, Gwen Her, MD  furosemide (LASIX) 20 MG tablet Take 20 mg by mouth daily.    [provider]  hydrALAZINE (APRESOLINE) 100 MG tablet Take 1 tablet (100 mg total) by mouth 3 (three) times daily. 11/10/16   Benjaman Kindler, MD  metoprolol tartrate (LOPRESSOR) 100 MG tablet Take 1 tablet (100 mg total) by mouth 2 (two) times daily. 11/10/16   Benjaman Kindler, MD  ondansetron (ZOFRAN) 4 MG tablet Take 1 tablet (4 mg total) by mouth every 6 (six) hours as needed for nausea. Patient not taking: Reported on 09/05/2016 09/14/15   Schermerhorn, Gwen Her, MD  oxycodone (OXY-IR) 5 MG capsule Take 1 capsule (5 mg  total) by mouth every 6 (six) hours as needed for pain. 11/10/16   Benjaman Kindler, MD    Allergies Shrimp [shellfish allergy]; Ace inhibitors; and Latex  Family History  Problem Relation Age of Onset  . CAD Father     Social History Social History  Substance Use Topics  . Smoking status: Never Smoker  . Smokeless tobacco: Never Used  . Alcohol use No    Review of Systems  Constitutional: No fever/chills Eyes: No visual changes. ENT: No sore  throat. Cardiovascular: Denies chest pain. Respiratory: Denies shortness of breath. Gastrointestinal: see history of present illness Genitourinary: Negative for dysuria. Musculoskeletal: Negative for back pain. Skin: Negative for rash. Neurological: Negative for headaches, focal weakness   ____________________________________________   PHYSICAL EXAM:  VITAL SIGNS: ED Triage Vitals  Enc Vitals Group     BP 01/28/17 1950 (!) 167/96     Pulse Rate 01/28/17 1950 81     Resp 01/28/17 1950 16     Temp 01/28/17 1950 98.2 F (36.8 C)     Temp Source 01/28/17 1950 Oral     SpO2 01/28/17 1950 100 %     Weight 01/28/17 1951 234 lb (106.1 kg)     Height 01/28/17 1951 5\' 3"  (1.6 m)     Head Circumference --      Peak Flow --      Pain Score 01/28/17 1950 9     Pain Loc --      Pain Edu? --      Excl. in [place]? --     Constitutional: Alert and oriented. Well appearing and in no acute distress. Eyes: Conjunctivae are normal.  Head: Atraumatic. Nose: No congestion/rhinnorhea. Mouth/Throat: Mucous membranes are moist.  Oropharynx non-erythematous. Neck: No stridor. Cardiovascular: Normal rate, regular rhythm. Grossly normal heart sounds.  Good peripheral circulation. Respiratory: Normal respiratory effort.  No retractions. Lungs CTAB. Gastrointestinal: Soft tender palpation in the mid lower abdomen. I do not feel any swelling myself but the patient says there is some. No distention. No abdominal bruits. No CVA tenderness. Musculoskeletal: No lower extremity tenderness nor edema.  No joint effusions. Neurologic:  Normal speech and language. No gross focal neurologic deficits are appreciated. No gait instability. Skin:  Skin is warm, dry and intact. No rash noted. Psychiatric: Mood and affect are normal. Speech and behavior are normal.  ____________________________________________   LABS (all labs ordered are listed, but only abnormal results are displayed)  Labs Reviewed   COMPREHENSIVE METABOLIC PANEL - Abnormal; Notable for the following:       Result Value   Potassium 3.1 (*)    Glucose, Bld 119 (*)    BUN 27 (*)    Creatinine, Ser 1.84 (*)    Calcium 10.4 (*)    GFR calc non Af Amer 32 (*)    GFR calc Af Amer 37 (*)    All other components within normal limits  CBC - Abnormal; Notable for the following:    Hemoglobin 10.1 (*)    HCT 31.4 (*)    MCV 75.3 (*)    MCH 24.2 (*)    RDW 19.9 (*)    All other components within normal limits  URINALYSIS, COMPLETE (UACMP) WITH MICROSCOPIC - Abnormal; Notable for the following:    Color, Urine STRAW (*)    APPearance CLEAR (*)    Protein, ur 100 (*)    Squamous Epithelial / LPF 0-5 (*)    All other components within normal limits  LIPASE,  BLOOD  DIFFERENTIAL   ____________________________________________  EKG   ____________________________________________  RADIOLOGY  CT read by radiology as no evidence of infection is a little bit of fluid in the subcutaneous fat around the umbilicus that it ____________________________________________   PROCEDURES  Procedure(s) performed:  Procedures  Critical Care performed:  ____________________________________________   INITIAL IMPRESSION / ASSESSMENT AND PLAN / ED COURSE  Pertinent labs & imaging results that were available during my care of the patient were reviewed by me and considered in my medical decision making (see chart for details).   discussed labs and CT report with Dr. Leonides Schanz who is on-call for Dr. Leafy Ro Dr. Leafy Ro will be in the office this week patient will follow up with Dr. Leafy Ro and return if she is worse especially for fever vomiting or     ____________________________________________   FINAL CLINICAL IMPRESSION(S) / ED DIAGNOSES  Final diagnoses:  Abdominal pain, unspecified abdominal location      NEW MEDICATIONS STARTED DURING THIS VISIT:  New Prescriptions   No medications on file     Note:  This  document was prepared using Dragon voice recognition software and may include unintentional dictation errors.    Nena Polio, MD 01/28/17 [id number]    Nena Polio, MD 01/28/17 [phone]

## 2017-01-28 NOTE — Discharge Instructions (Signed)
there is a small amount of fluid under part of the scar from the hysterectomy that is showing up on the CT scan. There is no sign of infection though. I think the best thing to do is to call up your OB/GYN doctor tomorrow morning. in the meantime use Advil or Tylenol as needed for the pain. Return for worse pain fever or vomiting.

## 2017-02-12 ENCOUNTER — Other Ambulatory Visit
Admission: RE | Admit: 2017-02-12 | Discharge: 2017-02-12 | Disposition: A | Discharge status: Home / Self Care | Payer: Self-pay | Source: Ambulatory Visit | Attending: Nephrology | Admitting: Nephrology

## 2017-02-12 DIAGNOSIS — I129 Hypertensive chronic kidney disease with stage 1 through stage 4 chronic kidney disease, or unspecified chronic kidney disease: Secondary | ICD-10-CM | POA: Insufficient documentation

## 2017-02-12 DIAGNOSIS — N183 Chronic kidney disease, stage 3 (moderate): Secondary | ICD-10-CM | POA: Insufficient documentation

## 2017-02-12 LAB — CBC WITH DIFFERENTIAL/PLATELET
BASOS PCT: 1 %
Basophils Absolute: 0.1 10*3/uL (ref 0–0.1)
EOS ABS: 0.3 10*3/uL (ref 0–0.7)
EOS PCT: 5 %
HCT: 34.5 % — ABNORMAL LOW (ref 35.0–47.0)
HEMOGLOBIN: 10.8 g/dL — AB (ref 12.0–16.0)
LYMPHS ABS: 1.9 10*3/uL (ref 1.0–3.6)
Lymphocytes Relative: 28 %
MCH: 23.5 pg — ABNORMAL LOW (ref 26.0–34.0)
MCHC: 31.3 g/dL — AB (ref 32.0–36.0)
MCV: 75.2 fL — ABNORMAL LOW (ref 80.0–100.0)
Monocytes Absolute: 0.5 10*3/uL (ref 0.2–0.9)
Monocytes Relative: 8 %
Neutro Abs: 3.9 10*3/uL (ref 1.4–6.5)
Neutrophils Relative %: 58 %
Platelets: 344 10*3/uL (ref 150–440)
RBC: 4.59 MIL/uL (ref 3.80–5.20)
RDW: 19.7 % — ABNORMAL HIGH (ref 11.5–14.5)
WBC: 6.7 10*3/uL (ref 3.6–11.0)

## 2017-02-12 LAB — COMPREHENSIVE METABOLIC PANEL
ALBUMIN: 3.5 g/dL (ref 3.5–5.0)
ALK PHOS: 119 U/L (ref 38–126)
ALT: 15 U/L (ref 14–54)
ANION GAP: 6 (ref 5–15)
AST: 18 U/L (ref 15–41)
BUN: 27 mg/dL — AB (ref 6–20)
CALCIUM: 10.7 mg/dL — AB (ref 8.9–10.3)
CO2: 29 mmol/L (ref 22–32)
Chloride: 104 mmol/L (ref 101–111)
Creatinine, Ser: 1.71 mg/dL — ABNORMAL HIGH (ref 0.44–1.00)
GFR calc Af Amer: 40 mL/min — ABNORMAL LOW (ref >60)
GFR calc non Af Amer: 35 mL/min — ABNORMAL LOW (ref >60)
GLUCOSE: 92 mg/dL (ref 65–99)
POTASSIUM: 3.7 mmol/L (ref 3.5–5.1)
SODIUM: 139 mmol/L (ref 135–145)
Total Bilirubin: 0.6 mg/dL (ref 0.3–1.2)
Total Protein: 8.3 g/dL — ABNORMAL HIGH (ref 6.5–8.1)

## 2017-02-12 LAB — PROTEIN / CREATININE RATIO, URINE
Creatinine, Urine: 193 mg/dL
PROTEIN CREATININE RATIO: 1.23 mg/mg{creat} — AB (ref 0.00–0.15)
TOTAL PROTEIN, URINE: 237 mg/dL

## 2017-02-12 LAB — PHOSPHORUS: Phosphorus: 3.2 mg/dL (ref 2.5–4.6)

## 2017-02-13 LAB — PARATHYROID HORMONE, INTACT (NO CA): PTH: 58 pg/mL (ref 15–65)

## 2017-02-27 ENCOUNTER — Other Ambulatory Visit
Admission: RE | Admit: 2017-02-27 | Discharge: 2017-02-27 | Disposition: A | Discharge status: Home / Self Care | Payer: Self-pay | Source: Ambulatory Visit | Attending: Nephrology | Admitting: Nephrology

## 2017-02-27 DIAGNOSIS — N183 Chronic kidney disease, stage 3 (moderate): Secondary | ICD-10-CM | POA: Insufficient documentation

## 2017-02-27 DIAGNOSIS — I10 Essential (primary) hypertension: Secondary | ICD-10-CM | POA: Insufficient documentation

## 2017-02-28 LAB — PROTEIN ELECTROPHORESIS, SERUM
A/G Ratio: 0.8 (ref 0.7–1.7)
ALBUMIN ELP: 3.2 g/dL (ref 2.9–4.4)
Alpha-1-Globulin: 0.3 g/dL (ref 0.0–0.4)
Alpha-2-Globulin: 1.1 g/dL — ABNORMAL HIGH (ref 0.4–1.0)
BETA GLOBULIN: 1.3 g/dL (ref 0.7–1.3)
GAMMA GLOBULIN: 1.6 g/dL (ref 0.4–1.8)
Globulin, Total: 4.2 g/dL — ABNORMAL HIGH (ref 2.2–3.9)
TOTAL PROTEIN ELP: 7.4 g/dL (ref 6.0–8.5)

## 2017-02-28 LAB — PROTEIN ELECTRO, RANDOM URINE
ALPHA-2-GLOBULIN, U: 5.2 %
Albumin ELP, Urine: 65.3 %
Alpha-1-Globulin, U: 4.7 %
BETA GLOBULIN, U: 12.7 %
GAMMA GLOBULIN, U: 12.2 %
TOTAL PROTEIN, URINE-UPE24: 195.5 mg/dL

## 2017-06-19 ENCOUNTER — Other Ambulatory Visit
Admission: RE | Admit: 2017-06-19 | Discharge: 2017-06-19 | Disposition: A | Discharge status: Home / Self Care | Payer: Self-pay | Source: Ambulatory Visit | Attending: Nephrology | Admitting: Nephrology

## 2017-06-20 ENCOUNTER — Other Ambulatory Visit
Admission: RE | Admit: 2017-06-20 | Discharge: 2017-06-20 | Disposition: A | Discharge status: Home / Self Care | Payer: Self-pay | Source: Ambulatory Visit | Attending: Nephrology | Admitting: Nephrology

## 2017-06-20 DIAGNOSIS — D631 Anemia in chronic kidney disease: Secondary | ICD-10-CM | POA: Insufficient documentation

## 2017-06-20 DIAGNOSIS — N183 Chronic kidney disease, stage 3 (moderate): Secondary | ICD-10-CM | POA: Insufficient documentation

## 2017-06-20 LAB — CBC WITH DIFFERENTIAL/PLATELET
BASOS ABS: 0 10*3/uL (ref 0–0.1)
BASOS PCT: 1 %
Eosinophils Absolute: 0.3 10*3/uL (ref 0–0.7)
Eosinophils Relative: 6 %
HEMATOCRIT: 37.1 % (ref 35.0–47.0)
HEMOGLOBIN: 11.5 g/dL — AB (ref 12.0–16.0)
LYMPHS PCT: 32 %
Lymphs Abs: 1.5 10*3/uL (ref 1.0–3.6)
MCH: 24.7 pg — ABNORMAL LOW (ref 26.0–34.0)
MCHC: 31.1 g/dL — ABNORMAL LOW (ref 32.0–36.0)
MCV: 79.4 fL — AB (ref 80.0–100.0)
MONOS PCT: 9 %
Monocytes Absolute: 0.4 10*3/uL (ref 0.2–0.9)
NEUTROS ABS: 2.4 10*3/uL (ref 1.4–6.5)
NEUTROS PCT: 52 %
Platelets: 313 10*3/uL (ref 150–440)
RBC: 4.67 MIL/uL (ref 3.80–5.20)
RDW: 17.9 % — ABNORMAL HIGH (ref 11.5–14.5)
WBC: 4.7 10*3/uL (ref 3.6–11.0)

## 2017-06-20 LAB — COMPREHENSIVE METABOLIC PANEL
ALBUMIN: 3.4 g/dL — AB (ref 3.5–5.0)
ALK PHOS: 112 U/L (ref 38–126)
ALT: 12 U/L — ABNORMAL LOW (ref 14–54)
ANION GAP: 6 (ref 5–15)
AST: 14 U/L — ABNORMAL LOW (ref 15–41)
BUN: 30 mg/dL — ABNORMAL HIGH (ref 6–20)
CALCIUM: 10.6 mg/dL — AB (ref 8.9–10.3)
CO2: 25 mmol/L (ref 22–32)
Chloride: 107 mmol/L (ref 101–111)
Creatinine, Ser: 1.52 mg/dL — ABNORMAL HIGH (ref 0.44–1.00)
GFR calc non Af Amer: 40 mL/min — ABNORMAL LOW (ref >60)
GFR, EST AFRICAN AMERICAN: 46 mL/min — AB (ref >60)
Glucose, Bld: 88 mg/dL (ref 65–99)
POTASSIUM: 4.2 mmol/L (ref 3.5–5.1)
SODIUM: 138 mmol/L (ref 135–145)
TOTAL PROTEIN: 8 g/dL (ref 6.5–8.1)
Total Bilirubin: 0.6 mg/dL (ref 0.3–1.2)

## 2017-06-20 LAB — PHOSPHORUS: Phosphorus: 2.8 mg/dL (ref 2.5–4.6)

## 2017-06-21 LAB — PARATHYROID HORMONE, INTACT (NO CA): PTH: 100 pg/mL — AB (ref 15–65)

## 2017-12-28 ENCOUNTER — Other Ambulatory Visit: Payer: Self-pay | Admitting: Family Medicine

## 2017-12-28 ENCOUNTER — Ambulatory Visit
Admission: RE | Admit: 2017-12-28 | Discharge: 2017-12-28 | Disposition: A | Discharge status: Home / Self Care | Payer: Disability Insurance | Source: Ambulatory Visit | Attending: Family Medicine | Admitting: Family Medicine

## 2017-12-28 DIAGNOSIS — M170 Bilateral primary osteoarthritis of knee: Secondary | ICD-10-CM

## 2017-12-28 DIAGNOSIS — E7800 Pure hypercholesterolemia, unspecified: Secondary | ICD-10-CM | POA: Insufficient documentation

## 2017-12-28 DIAGNOSIS — Z8673 Personal history of transient ischemic attack (TIA), and cerebral infarction without residual deficits: Secondary | ICD-10-CM | POA: Diagnosis not present

## 2017-12-28 DIAGNOSIS — M2341 Loose body in knee, right knee: Secondary | ICD-10-CM | POA: Insufficient documentation

## 2017-12-28 DIAGNOSIS — I10 Essential (primary) hypertension: Secondary | ICD-10-CM | POA: Insufficient documentation

## 2017-12-28 DIAGNOSIS — I252 Old myocardial infarction: Secondary | ICD-10-CM | POA: Diagnosis not present

## 2018-01-22 ENCOUNTER — Other Ambulatory Visit
Admission: RE | Admit: 2018-01-22 | Discharge: 2018-01-22 | Disposition: A | Discharge status: Home / Self Care | Payer: Disability Insurance | Source: Ambulatory Visit | Attending: Nephrology | Admitting: Nephrology

## 2018-01-22 DIAGNOSIS — N183 Chronic kidney disease, stage 3 (moderate): Secondary | ICD-10-CM | POA: Diagnosis present

## 2018-01-22 LAB — COMPREHENSIVE METABOLIC PANEL
ALBUMIN: 3.7 g/dL (ref 3.5–5.0)
ALT: 19 U/L (ref 0–44)
ANION GAP: 7 (ref 5–15)
AST: 16 U/L (ref 15–41)
Alkaline Phosphatase: 118 U/L (ref 38–126)
BILIRUBIN TOTAL: 0.3 mg/dL (ref 0.3–1.2)
BUN: 24 mg/dL — ABNORMAL HIGH (ref 6–20)
CO2: 26 mmol/L (ref 22–32)
Calcium: 10.6 mg/dL — ABNORMAL HIGH (ref 8.9–10.3)
Chloride: 106 mmol/L (ref 98–111)
Creatinine, Ser: 1.41 mg/dL — ABNORMAL HIGH (ref 0.44–1.00)
GFR calc Af Amer: 50 mL/min — ABNORMAL LOW (ref >60)
GFR calc non Af Amer: 44 mL/min — ABNORMAL LOW (ref >60)
GLUCOSE: 88 mg/dL (ref 70–99)
POTASSIUM: 4.1 mmol/L (ref 3.5–5.1)
SODIUM: 139 mmol/L (ref 135–145)
TOTAL PROTEIN: 7.7 g/dL (ref 6.5–8.1)

## 2018-01-22 LAB — CBC WITH DIFFERENTIAL/PLATELET
BASOS PCT: 1 %
Basophils Absolute: 0 10*3/uL (ref 0–0.1)
EOS ABS: 0.4 10*3/uL (ref 0–0.7)
EOS PCT: 7 %
HCT: 41.9 % (ref 35.0–47.0)
Hemoglobin: 13.8 g/dL (ref 12.0–16.0)
Lymphocytes Relative: 28 %
Lymphs Abs: 1.6 10*3/uL (ref 1.0–3.6)
MCH: 28.3 pg (ref 26.0–34.0)
MCHC: 32.9 g/dL (ref 32.0–36.0)
MCV: 86.1 fL (ref 80.0–100.0)
MONO ABS: 0.5 10*3/uL (ref 0.2–0.9)
MONOS PCT: 9 %
Neutro Abs: 3.1 10*3/uL (ref 1.4–6.5)
Neutrophils Relative %: 55 %
Platelets: 233 10*3/uL (ref 150–440)
RBC: 4.87 MIL/uL (ref 3.80–5.20)
RDW: 16 % — AB (ref 11.5–14.5)
WBC: 5.6 10*3/uL (ref 3.6–11.0)

## 2018-01-22 LAB — PHOSPHORUS: Phosphorus: 2.2 mg/dL — ABNORMAL LOW (ref 2.5–4.6)

## 2018-01-23 LAB — MICROALBUMIN / CREATININE URINE RATIO
Creatinine, Urine: 122.7 mg/dL
Microalb Creat Ratio: 930.7 mg/g{creat} — ABNORMAL HIGH (ref 0.0–30.0)
Microalb, Ur: 1142 ug/mL — ABNORMAL HIGH

## 2018-01-23 LAB — PARATHYROID HORMONE, INTACT (NO CA): PTH: 91 pg/mL — ABNORMAL HIGH (ref 15–65)

## 2018-06-17 ENCOUNTER — Other Ambulatory Visit
Admission: RE | Admit: 2018-06-17 | Discharge: 2018-06-17 | Disposition: A | Discharge status: Home / Self Care | Payer: Self-pay | Attending: Nephrology | Admitting: Nephrology

## 2018-06-17 DIAGNOSIS — N183 Chronic kidney disease, stage 3 (moderate): Secondary | ICD-10-CM | POA: Insufficient documentation

## 2018-06-17 DIAGNOSIS — R809 Proteinuria, unspecified: Secondary | ICD-10-CM | POA: Insufficient documentation

## 2018-06-17 DIAGNOSIS — I129 Hypertensive chronic kidney disease with stage 1 through stage 4 chronic kidney disease, or unspecified chronic kidney disease: Secondary | ICD-10-CM | POA: Insufficient documentation

## 2018-06-17 DIAGNOSIS — N2581 Secondary hyperparathyroidism of renal origin: Secondary | ICD-10-CM | POA: Insufficient documentation

## 2018-06-17 LAB — CBC WITH DIFFERENTIAL/PLATELET
ABS IMMATURE GRANULOCYTES: 0.02 10*3/uL (ref 0.00–0.07)
BASOS ABS: 0 10*3/uL (ref 0.0–0.1)
Basophils Relative: 0 %
Eosinophils Absolute: 0.6 10*3/uL — ABNORMAL HIGH (ref 0.0–0.5)
Eosinophils Relative: 9 %
HCT: 42.9 % (ref 36.0–46.0)
Hemoglobin: 13.7 g/dL (ref 12.0–15.0)
IMMATURE GRANULOCYTES: 0 %
LYMPHS PCT: 29 %
Lymphs Abs: 2 10*3/uL (ref 0.7–4.0)
MCH: 28.5 pg (ref 26.0–34.0)
MCHC: 31.9 g/dL (ref 30.0–36.0)
MCV: 89.2 fL (ref 80.0–100.0)
Monocytes Absolute: 0.5 10*3/uL (ref 0.1–1.0)
Monocytes Relative: 7 %
NEUTROS ABS: 3.7 10*3/uL (ref 1.7–7.7)
NEUTROS PCT: 55 %
NRBC: 0 % (ref 0.0–0.2)
Platelets: 260 10*3/uL (ref 150–400)
RBC: 4.81 MIL/uL (ref 3.87–5.11)
RDW: 14.3 % (ref 11.5–15.5)
WBC: 6.8 10*3/uL (ref 4.0–10.5)

## 2018-06-17 LAB — COMPREHENSIVE METABOLIC PANEL
ALBUMIN: 3.6 g/dL (ref 3.5–5.0)
ALT: 26 U/L (ref 0–44)
ANION GAP: 7 (ref 5–15)
AST: 24 U/L (ref 15–41)
Alkaline Phosphatase: 111 U/L (ref 38–126)
BUN: 30 mg/dL — AB (ref 6–20)
CHLORIDE: 110 mmol/L (ref 98–111)
CO2: 27 mmol/L (ref 22–32)
Calcium: 11 mg/dL — ABNORMAL HIGH (ref 8.9–10.3)
Creatinine, Ser: 1.58 mg/dL — ABNORMAL HIGH (ref 0.44–1.00)
GFR calc Af Amer: 45 mL/min — ABNORMAL LOW (ref >60)
GFR calc non Af Amer: 39 mL/min — ABNORMAL LOW (ref >60)
Glucose, Bld: 96 mg/dL (ref 70–99)
POTASSIUM: 3.9 mmol/L (ref 3.5–5.1)
SODIUM: 144 mmol/L (ref 135–145)
Total Bilirubin: 0.6 mg/dL (ref 0.3–1.2)
Total Protein: 8 g/dL (ref 6.5–8.1)

## 2018-06-17 LAB — PHOSPHORUS: Phosphorus: 3.3 mg/dL (ref 2.5–4.6)

## 2018-06-18 LAB — PARATHYROID HORMONE, INTACT (NO CA): PTH: 94 pg/mL — ABNORMAL HIGH (ref 15–65)

## 2018-06-18 LAB — MICROALBUMIN / CREATININE URINE RATIO
Creatinine, Urine: 193.8 mg/dL
MICROALB UR: 1493.3 ug/mL — AB
MICROALB/CREAT RATIO: 771 mg/g{creat} — AB (ref 0–29)

## 2018-06-26 ENCOUNTER — Ambulatory Visit: Payer: Self-pay | Attending: Oncology | Admitting: *Deleted

## 2018-06-26 ENCOUNTER — Ambulatory Visit
Admission: RE | Admit: 2018-06-26 | Discharge: 2018-06-26 | Disposition: A | Discharge status: Home / Self Care | Payer: Self-pay | Source: Ambulatory Visit | Attending: Oncology | Admitting: Oncology

## 2018-06-26 ENCOUNTER — Encounter (INDEPENDENT_AMBULATORY_CARE_PROVIDER_SITE_OTHER): Payer: Self-pay

## 2018-06-26 ENCOUNTER — Encounter: Payer: Self-pay | Admitting: *Deleted

## 2018-06-26 VITALS — BP 135/87 | HR 59 | Temp 98.3°F | Ht 64.8 in | Wt 287.0 lb

## 2018-06-26 DIAGNOSIS — Z0000 Encounter for general adult medical examination without abnormal findings: Secondary | ICD-10-CM | POA: Insufficient documentation

## 2018-06-26 NOTE — Progress Notes (Signed)
  Subjective:     Patient ID: [name], female   DOB: Sep 19, 1970, 48 y.o.   MRN: [id number]  HPI   Review of Systems     Objective:   Physical Exam Chest:     Breasts:        Right: No swelling, bleeding, inverted nipple, mass, nipple discharge, skin change or tenderness.        Left: No swelling, bleeding, inverted nipple, mass, nipple discharge, skin change or tenderness.    Lymphadenopathy:     Upper Body:     Right upper body: No supraclavicular or axillary adenopathy.     Left upper body: No supraclavicular or axillary adenopathy.        Assessment:     48 year old 41 female referred to [place] [facility] clinical breast exam and mammogram.  Clinical breast exam unremarkable.  Taught self breast awareness.  Patient with a history of hysterectomy for fibroids.  Pap omitted per protocol.  Patient ambulates with a slight limp.  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  Patient has been screened for eligibility.  She does not have any insurance, Medicare or Medicaid.  She also meets financial eligibility.  Hand-out given on the Affordable Care Act.   Risk Assessment    Risk Scores      06/26/2018   Last edited by: Orson Slick, CMA   5-year risk: 1 %   Lifetime risk: 9 %            Plan:     Screening mammogram ordered.  Will follow-up per [facility] protocol.

## 2018-06-26 NOTE — Patient Instructions (Signed)
Gave patient hand-out, Women Staying Healthy, Active and Well from [facility], with education on breast health, pap smears, heart and colon health. 

## 2018-07-05 ENCOUNTER — Encounter: Payer: Self-pay | Admitting: *Deleted

## 2018-07-05 NOTE — Progress Notes (Signed)
Letter mailed from the [facility] to inform patient of her normal mammogram results.  Patient is to follow-up with annual screening in one year.  HSIS to [facility]. 

## 2018-10-18 ENCOUNTER — Other Ambulatory Visit
Admission: RE | Admit: 2018-10-18 | Discharge: 2018-10-18 | Disposition: A | Discharge status: Home / Self Care | Payer: Self-pay | Attending: Nephrology | Admitting: Nephrology

## 2018-10-18 ENCOUNTER — Other Ambulatory Visit: Payer: Self-pay

## 2018-10-18 DIAGNOSIS — I129 Hypertensive chronic kidney disease with stage 1 through stage 4 chronic kidney disease, or unspecified chronic kidney disease: Secondary | ICD-10-CM | POA: Insufficient documentation

## 2018-10-18 DIAGNOSIS — N183 Chronic kidney disease, stage 3 (moderate): Secondary | ICD-10-CM | POA: Insufficient documentation

## 2018-10-18 DIAGNOSIS — N2581 Secondary hyperparathyroidism of renal origin: Secondary | ICD-10-CM | POA: Insufficient documentation

## 2018-10-18 DIAGNOSIS — R809 Proteinuria, unspecified: Secondary | ICD-10-CM | POA: Insufficient documentation

## 2018-10-18 LAB — COMPREHENSIVE METABOLIC PANEL
ALT: 24 U/L (ref 0–44)
AST: 19 U/L (ref 15–41)
Albumin: 3.7 g/dL (ref 3.5–5.0)
Alkaline Phosphatase: 121 U/L (ref 38–126)
Anion gap: 6 (ref 5–15)
BUN: 24 mg/dL — ABNORMAL HIGH (ref 6–20)
CO2: 21 mmol/L — ABNORMAL LOW (ref 22–32)
Calcium: 10.2 mg/dL (ref 8.9–10.3)
Chloride: 108 mmol/L (ref 98–111)
Creatinine, Ser: 1.52 mg/dL — ABNORMAL HIGH (ref 0.44–1.00)
GFR calc Af Amer: 47 mL/min — ABNORMAL LOW (ref >60)
GFR calc non Af Amer: 40 mL/min — ABNORMAL LOW (ref >60)
Glucose, Bld: 94 mg/dL (ref 70–99)
Potassium: 3.6 mmol/L (ref 3.5–5.1)
Sodium: 135 mmol/L (ref 135–145)
Total Bilirubin: 0.8 mg/dL (ref 0.3–1.2)
Total Protein: 8.2 g/dL — ABNORMAL HIGH (ref 6.5–8.1)

## 2018-10-18 LAB — PROTEIN / CREATININE RATIO, URINE
Creatinine, Urine: 224 mg/dL
Protein Creatinine Ratio: 2.15 mg/mg{Cre} — ABNORMAL HIGH (ref 0.00–0.15)
Total Protein, Urine: 482 mg/dL

## 2018-10-18 LAB — CBC WITH DIFFERENTIAL/PLATELET
Abs Immature Granulocytes: 0.02 10*3/uL (ref 0.00–0.07)
Basophils Absolute: 0 10*3/uL (ref 0.0–0.1)
Basophils Relative: 0 %
Eosinophils Absolute: 0.4 10*3/uL (ref 0.0–0.5)
Eosinophils Relative: 6 %
HCT: 45.1 % (ref 36.0–46.0)
Hemoglobin: 14.7 g/dL (ref 12.0–15.0)
Immature Granulocytes: 0 %
Lymphocytes Relative: 25 %
Lymphs Abs: 1.7 10*3/uL (ref 0.7–4.0)
MCH: 28.8 pg (ref 26.0–34.0)
MCHC: 32.6 g/dL (ref 30.0–36.0)
MCV: 88.4 fL (ref 80.0–100.0)
Monocytes Absolute: 0.4 10*3/uL (ref 0.1–1.0)
Monocytes Relative: 5 %
Neutro Abs: 4.3 10*3/uL (ref 1.7–7.7)
Neutrophils Relative %: 64 %
Platelets: 265 10*3/uL (ref 150–400)
RBC: 5.1 MIL/uL (ref 3.87–5.11)
RDW: 14.3 % (ref 11.5–15.5)
WBC: 6.8 10*3/uL (ref 4.0–10.5)
nRBC: 0 % (ref 0.0–0.2)

## 2018-10-18 LAB — PHOSPHORUS: Phosphorus: 2.8 mg/dL (ref 2.5–4.6)

## 2018-10-19 LAB — PARATHYROID HORMONE, INTACT (NO CA): PTH: 171 pg/mL — ABNORMAL HIGH (ref 15–65)

## 2019-02-18 DIAGNOSIS — N184 Chronic kidney disease, stage 4 (severe): Secondary | ICD-10-CM | POA: Insufficient documentation

## 2019-03-18 IMAGING — CT CT HEAD W/O CM
3 series · 16 of 44 positions shown, 19 images · non-contrast
Comparison: CT scan of July 29, 2016.

CLINICAL DATA: Left upper extremity numbness.

EXAM:
CT HEAD WITHOUT CONTRAST
TECHNIQUE: Contiguous axial images were obtained from the base of the skull
through the vertex without intravenous contrast.

[Series 2: head wo · axial · 0.41mm/px · z∈[-134,-24]mm · 10 of 27 slices shown, 13 images]
[im 3/27  brain]
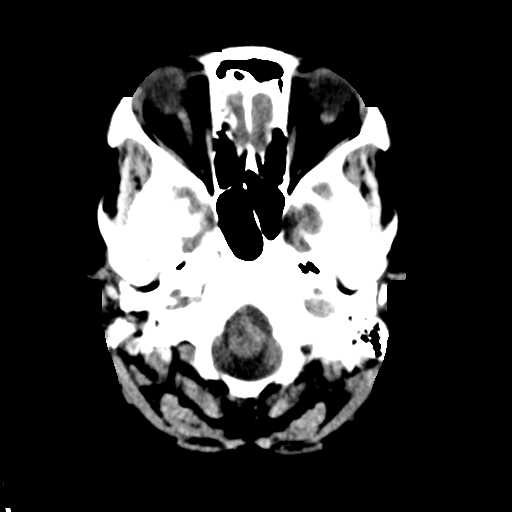
[im 3/27  bone]
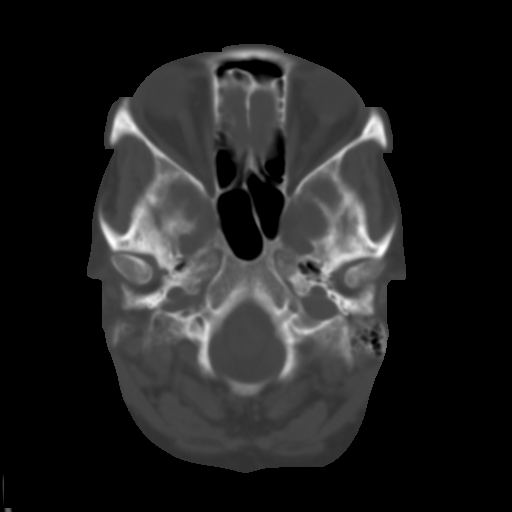
[im 5/27  brain]
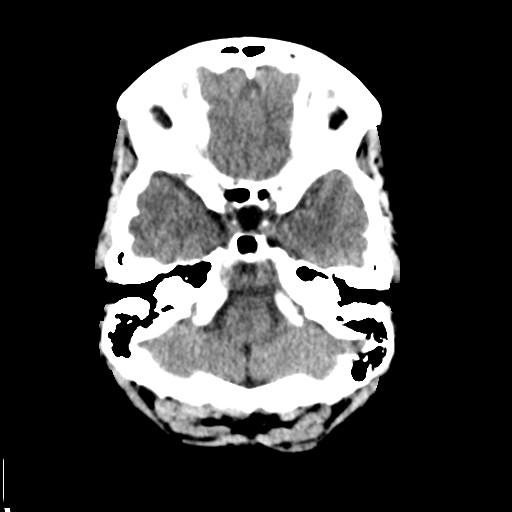
[im 8/27  brain]
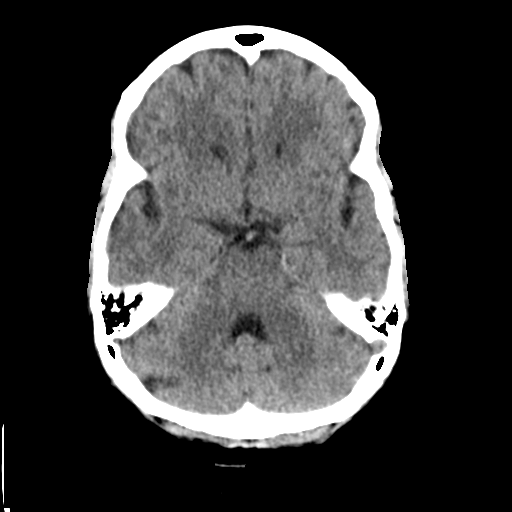
[im 10/27  brain]
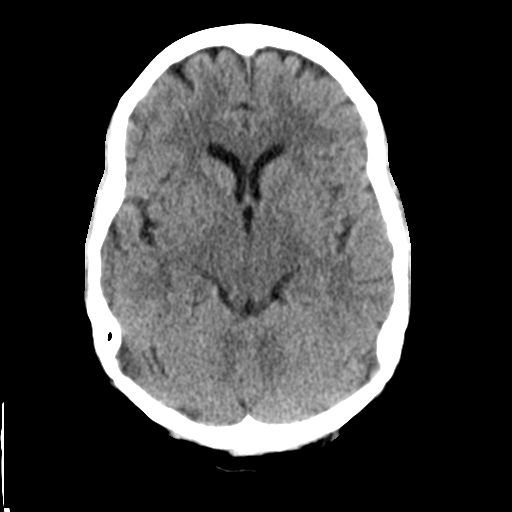
[im 13/27  brain]
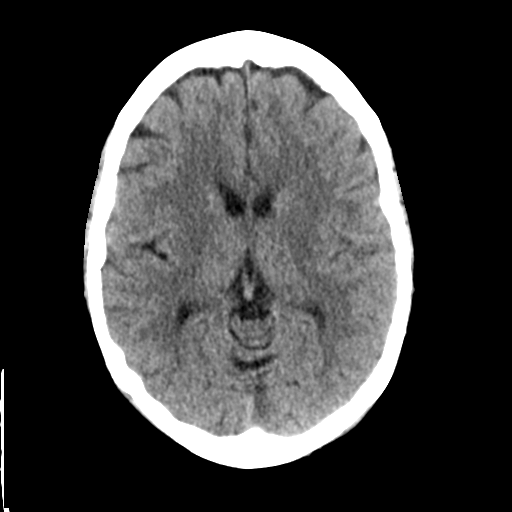
[im 13/27  bone]
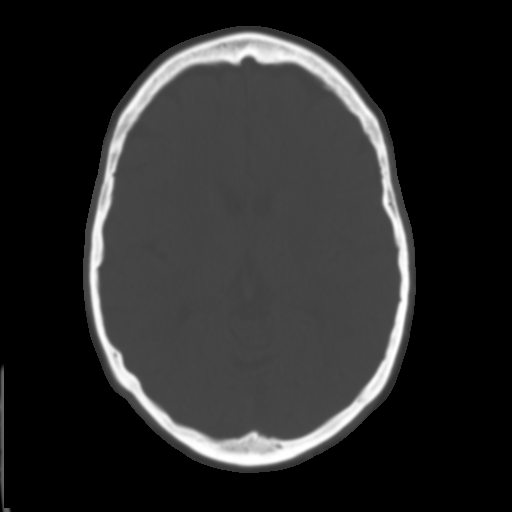
[im 15/27  brain]
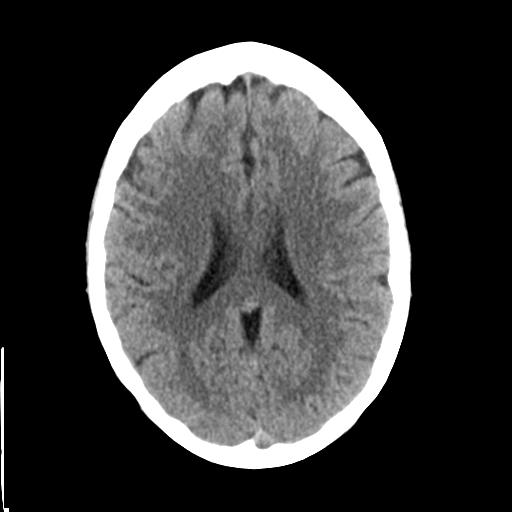
[im 18/27  brain]
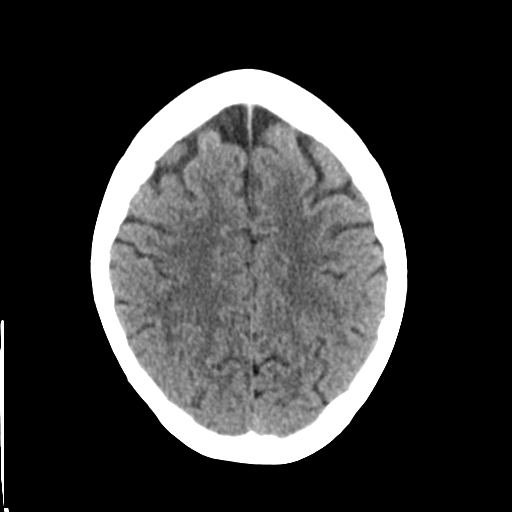
[im 20/27  brain]
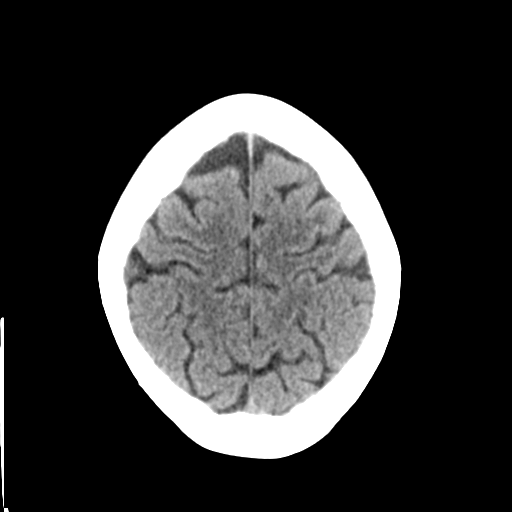
[im 23/27  brain]
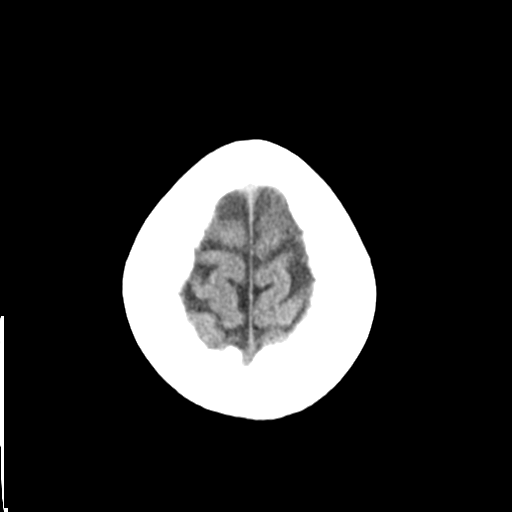
[im 23/27  bone]
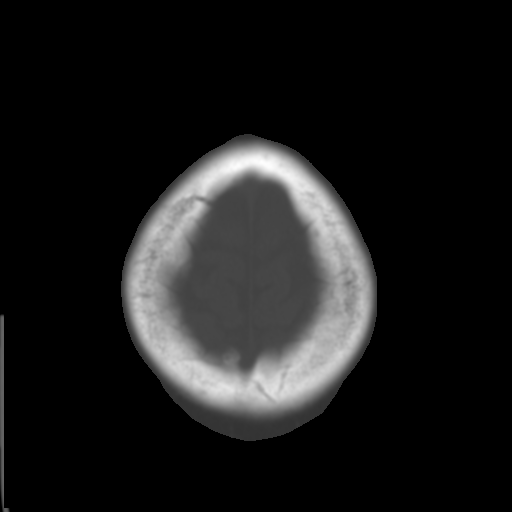
[im 25/27  brain]
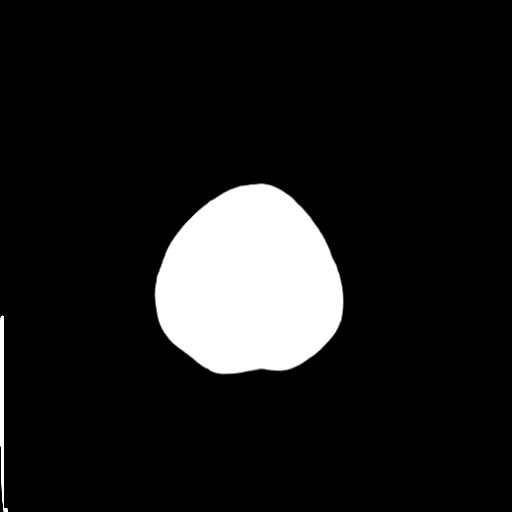

[Series 4: coronal soft tissue · coronal · 0.27mm/px · 3 of 61 slices shown]
[im 21/61  brain]
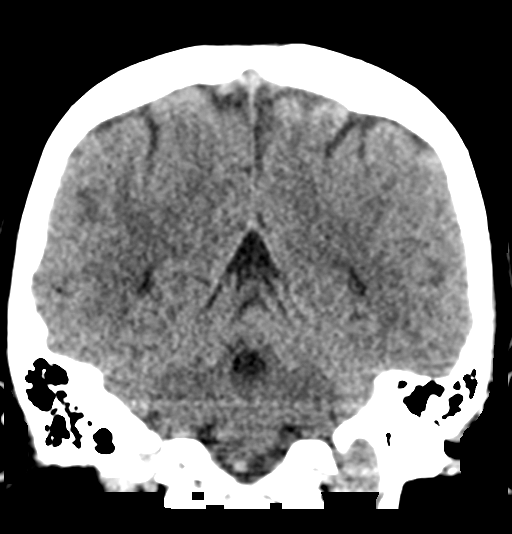
[im 27/61  brain]
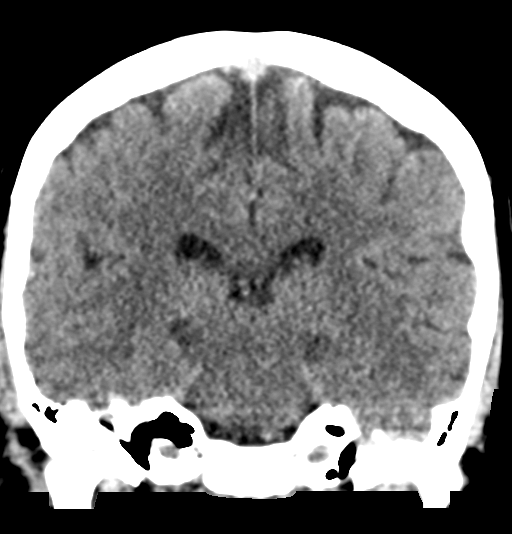
[im 34/61  brain]
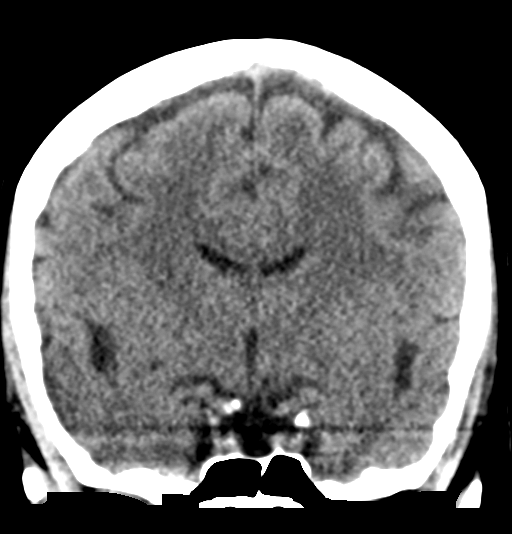

[Series 5: sagittal soft tissue · sagittal · 0.28mm/px · 3 of 46 slices shown]
[im 16/46  brain]
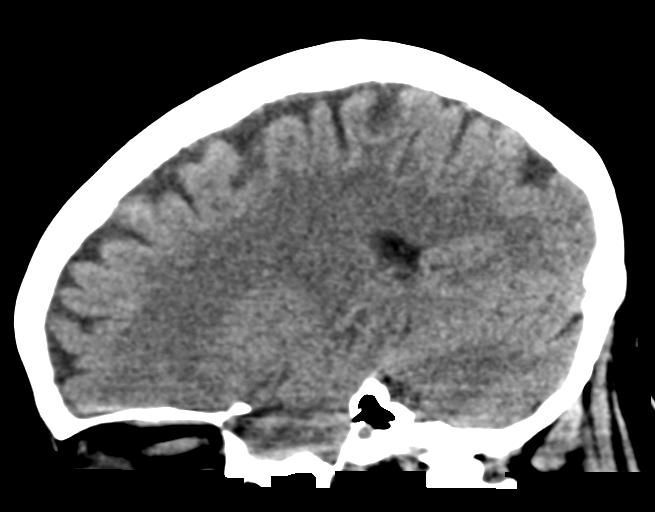
[im 23/46  brain]
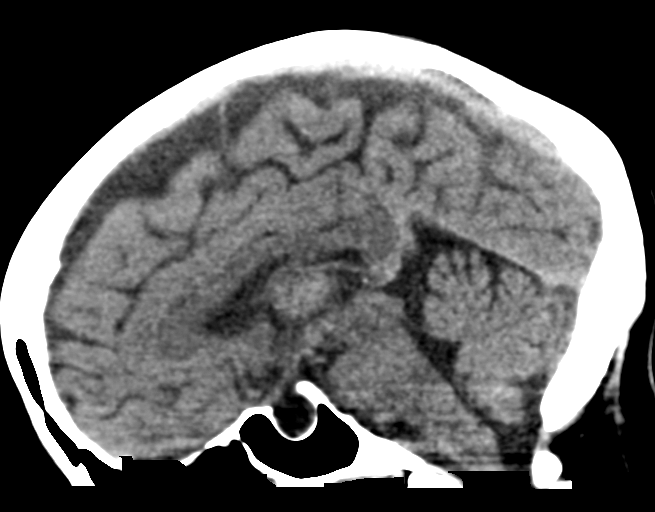
[im 31/46  brain]
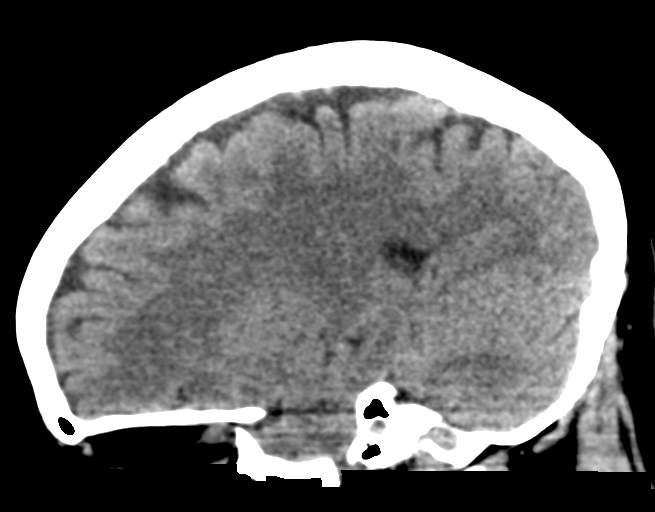

[16 of 44 positions shown; findings below may reference images not displayed]

FINDINGS: Brain: No mass effect or midline shift is noted. Ventricular size is
within normal limits. There is no evidence of hemorrhage or acute
infarction. Stable 12 mm calcified meningioma is seen involving high
right vertex.

Vascular: No hyperdense vessel or unexpected calcification.

Skull: Normal. Negative for fracture or focal lesion.

Sinuses/Orbits: No acute finding.

Other: None.
IMPRESSION: Stable 12 mm calcified meningioma in high right vertex. No acute
intracranial abnormality seen.

## 2019-05-30 ENCOUNTER — Other Ambulatory Visit: Payer: Self-pay

## 2019-06-02 NOTE — Progress Notes (Signed)
Patient pre-screened for [facility] eligibility due to COVID 19 precautions. Two patient identifiers used for verification that I was speaking to correct patient.  Patient to Present directly to [facility] 06/03/19 for [facility] screening mammogram.

## 2019-06-03 ENCOUNTER — Encounter: Payer: Self-pay | Admitting: *Deleted

## 2019-06-03 ENCOUNTER — Ambulatory Visit
Admission: RE | Admit: 2019-06-03 | Discharge: 2019-06-03 | Disposition: A | Discharge status: Home / Self Care | Payer: Self-pay | Source: Ambulatory Visit | Attending: Oncology | Admitting: Oncology

## 2019-06-03 ENCOUNTER — Ambulatory Visit: Payer: Self-pay | Attending: Oncology | Admitting: *Deleted

## 2019-06-03 DIAGNOSIS — Z0000 Encounter for general adult medical examination without abnormal findings: Secondary | ICD-10-CM | POA: Insufficient documentation

## 2019-06-03 NOTE — Progress Notes (Signed)
  Subjective:     Patient ID: [name], female   DOB: Jun 27, 1970, 49 y.o.   MRN: [id number]  HPI   Review of Systems     Objective:   Physical Exam     Assessment:     Due to Covid 19 pandemic a televisit was used to enroll patient into our BCCCP program and obtain her health history.  Patient has a history of hysterectomy for fibroids.  Pap omitted per protocol.  Patient has been screened for eligibility.  She does not have any insurance, Medicare or Medicaid.  She also meets financial eligibility.  Baker Janus model breast cancer risk assessment below: Risk Assessment    No risk assessment data for the current encounter   Risk Scores      05/30/2019   Last edited by: Orson Slick, CMA   5-year risk: 1 %   Lifetime risk: 8.9 %            Plan:     Screening mammogram ordered. Will follow up per [facility] protocol.

## 2019-06-03 NOTE — Progress Notes (Signed)
Letter mailed from the [facility] to inform patient of her normal mammogram results.  Patient is to follow-up with annual screening in one year. 

## 2019-07-04 ENCOUNTER — Other Ambulatory Visit
Admission: RE | Admit: 2019-07-04 | Discharge: 2019-07-04 | Disposition: A | Discharge status: Home / Self Care | Payer: Self-pay | Source: Ambulatory Visit | Attending: Nephrology | Admitting: Nephrology

## 2019-07-04 DIAGNOSIS — R809 Proteinuria, unspecified: Secondary | ICD-10-CM | POA: Insufficient documentation

## 2019-07-04 DIAGNOSIS — I10 Essential (primary) hypertension: Secondary | ICD-10-CM | POA: Insufficient documentation

## 2019-07-04 DIAGNOSIS — N183 Chronic kidney disease, stage 3 unspecified: Secondary | ICD-10-CM | POA: Insufficient documentation

## 2019-07-04 DIAGNOSIS — N2581 Secondary hyperparathyroidism of renal origin: Secondary | ICD-10-CM | POA: Insufficient documentation

## 2019-07-04 LAB — CBC WITH DIFFERENTIAL/PLATELET
Abs Immature Granulocytes: 0.03 10*3/uL (ref 0.00–0.07)
Basophils Absolute: 0 10*3/uL (ref 0.0–0.1)
Basophils Relative: 1 %
Eosinophils Absolute: 0.3 10*3/uL (ref 0.0–0.5)
Eosinophils Relative: 5 %
HCT: 44.3 % (ref 36.0–46.0)
Hemoglobin: 14.3 g/dL (ref 12.0–15.0)
Immature Granulocytes: 1 %
Lymphocytes Relative: 38 %
Lymphs Abs: 2.3 10*3/uL (ref 0.7–4.0)
MCH: 29.4 pg (ref 26.0–34.0)
MCHC: 32.3 g/dL (ref 30.0–36.0)
MCV: 91 fL (ref 80.0–100.0)
Monocytes Absolute: 0.5 10*3/uL (ref 0.1–1.0)
Monocytes Relative: 9 %
Neutro Abs: 2.8 10*3/uL (ref 1.7–7.7)
Neutrophils Relative %: 46 %
Platelets: 277 10*3/uL (ref 150–400)
RBC: 4.87 MIL/uL (ref 3.87–5.11)
RDW: 14.2 % (ref 11.5–15.5)
WBC: 6 10*3/uL (ref 4.0–10.5)
nRBC: 0 % (ref 0.0–0.2)

## 2019-07-04 LAB — RENAL FUNCTION PANEL
Albumin: 3.8 g/dL (ref 3.5–5.0)
Anion gap: 9 (ref 5–15)
BUN: 29 mg/dL — ABNORMAL HIGH (ref 6–20)
CO2: 22 mmol/L (ref 22–32)
Calcium: 11.3 mg/dL — ABNORMAL HIGH (ref 8.9–10.3)
Chloride: 107 mmol/L (ref 98–111)
Creatinine, Ser: 1.82 mg/dL — ABNORMAL HIGH (ref 0.44–1.00)
GFR calc Af Amer: 37 mL/min — ABNORMAL LOW (ref >60)
GFR calc non Af Amer: 32 mL/min — ABNORMAL LOW (ref >60)
Glucose, Bld: 94 mg/dL (ref 70–99)
Phosphorus: 2.8 mg/dL (ref 2.5–4.6)
Potassium: 4 mmol/L (ref 3.5–5.1)
Sodium: 138 mmol/L (ref 135–145)

## 2019-07-04 LAB — PROTEIN / CREATININE RATIO, URINE
Creatinine, Urine: 190 mg/dL
Protein Creatinine Ratio: 2 mg/mg{Cre} — ABNORMAL HIGH (ref 0.00–0.15)
Total Protein, Urine: 380 mg/dL

## 2019-07-05 LAB — PARATHYROID HORMONE, INTACT (NO CA): PTH: 91 pg/mL — ABNORMAL HIGH (ref 15–65)

## 2019-07-07 LAB — PROTEIN ELECTRO, RANDOM URINE
Albumin ELP, Urine: 74.4 %
Alpha-1-Globulin, U: 3.3 %
Alpha-2-Globulin, U: 3.6 %
Beta Globulin, U: 9.9 %
Gamma Globulin, U: 8.8 %
Total Protein, Urine: 395.9 mg/dL

## 2019-07-07 LAB — PROTEIN ELECTROPHORESIS, SERUM
A/G Ratio: 1 (ref 0.7–1.7)
Albumin ELP: 3.6 g/dL (ref 2.9–4.4)
Alpha-1-Globulin: 0.2 g/dL (ref 0.0–0.4)
Alpha-2-Globulin: 0.9 g/dL (ref 0.4–1.0)
Beta Globulin: 1.4 g/dL — ABNORMAL HIGH (ref 0.7–1.3)
Gamma Globulin: 1.2 g/dL (ref 0.4–1.8)
Globulin, Total: 3.7 g/dL (ref 2.2–3.9)
Total Protein ELP: 7.3 g/dL (ref 6.0–8.5)

## 2020-03-06 IMAGING — CR DG KNEE 1-2V*L*
2 series · 2 of 2 positions shown · non-contrast
Comparison: 03/22/2010

CLINICAL DATA: 47-year-old female with a history of knee pain

EXAM:
LEFT KNEE - 1-2 VIEW

[knee ap]
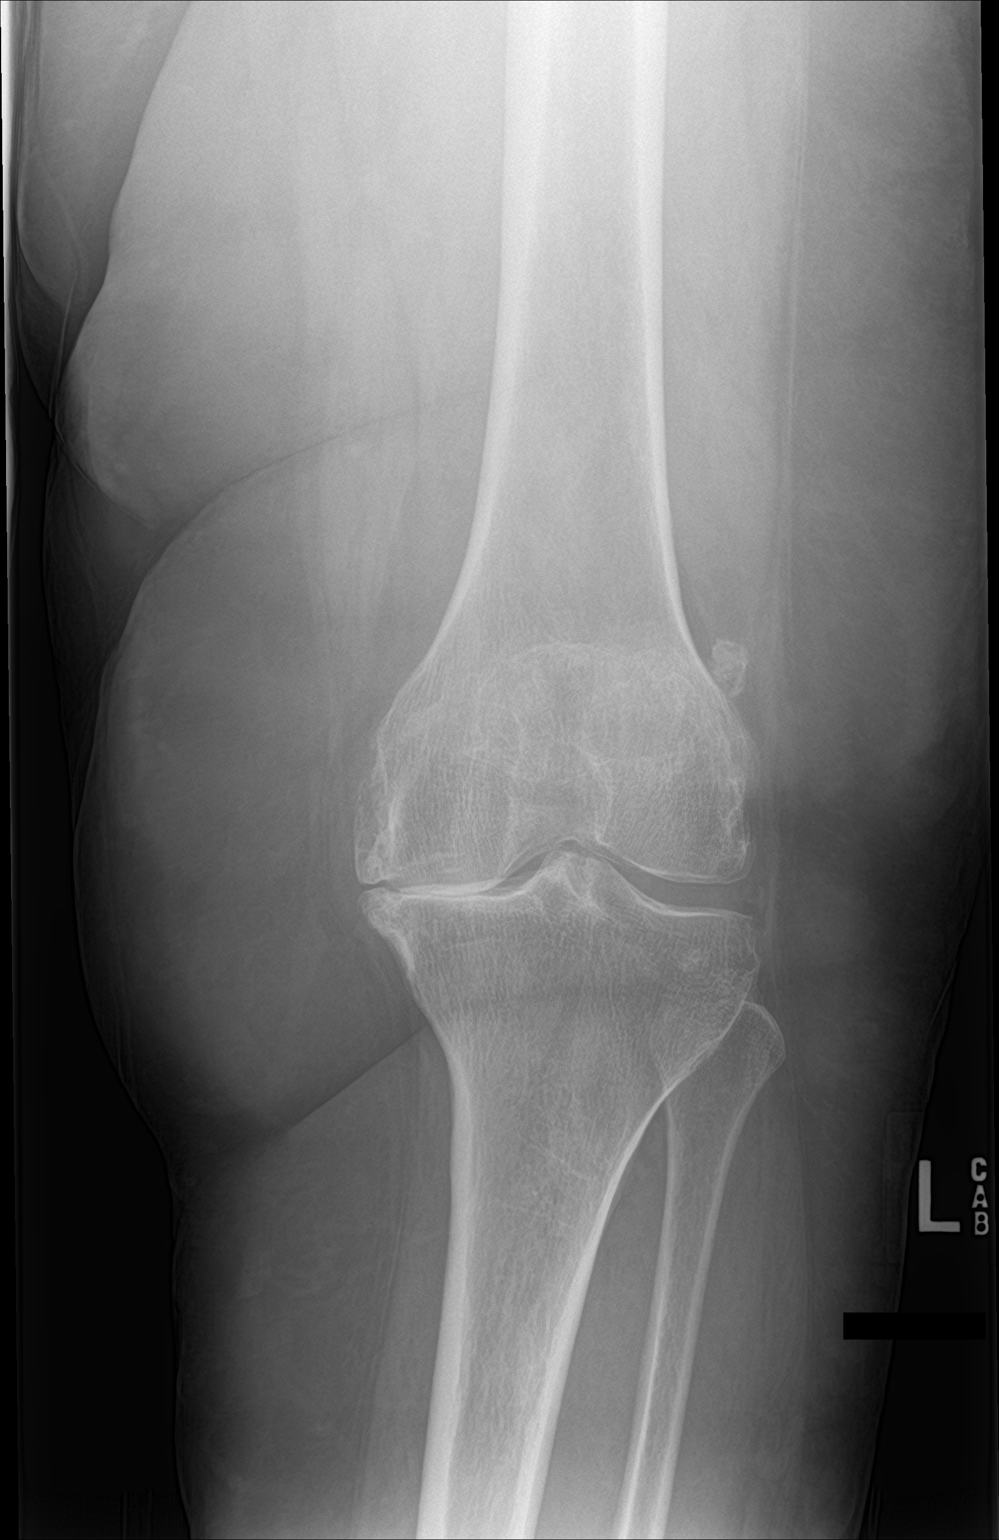

[knee lat]
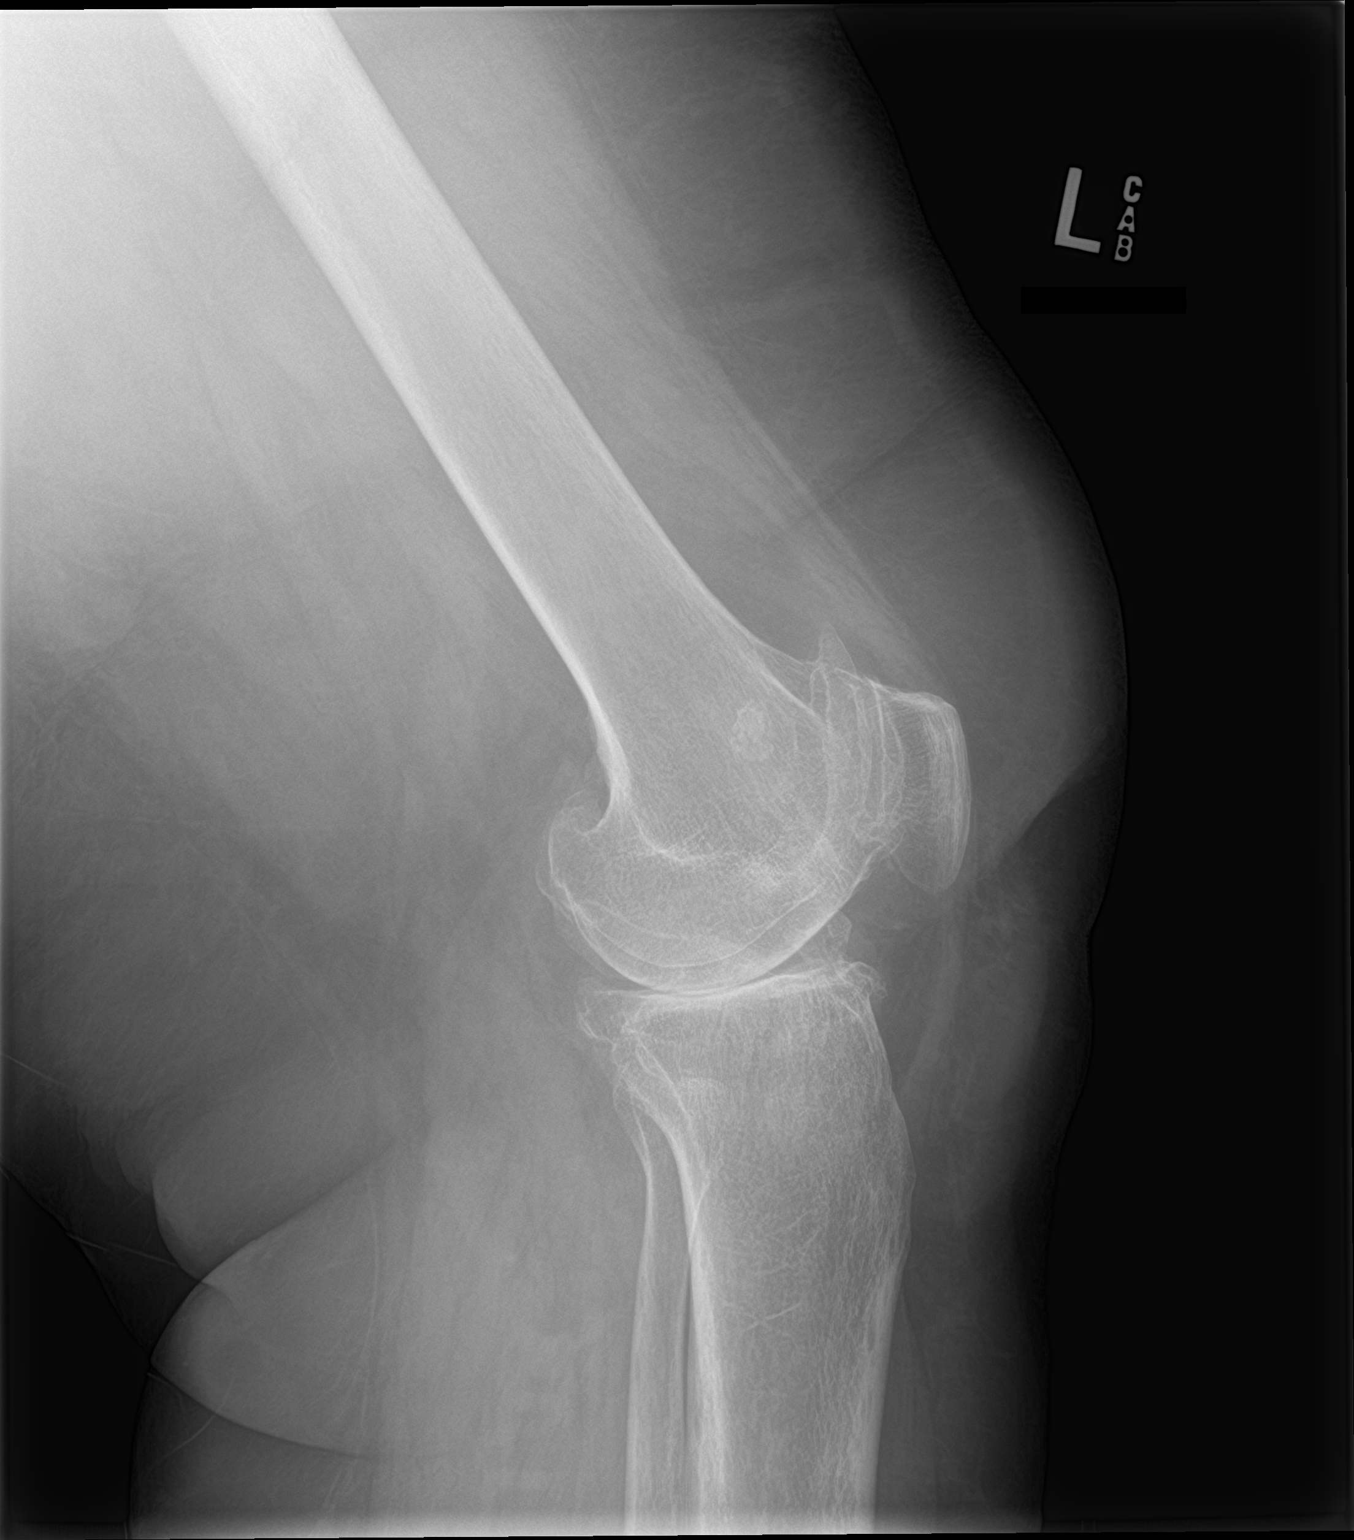

[2 of 2 positions shown; findings below may reference images not displayed]

FINDINGS: No acute displaced fracture. Medial greater than lateral joint space
narrowing with marginal osteophyte formation. Bone-on-bone
approximation at the medial compartment. No joint effusion. No focal
soft tissue swelling. Patellofemoral degenerative changes. No
radiopaque foreign body.
IMPRESSION: Negative for acute bony abnormality.

Advanced tricompartmental osteoarthritis

## 2020-06-01 ENCOUNTER — Other Ambulatory Visit: Payer: Self-pay | Admitting: Family Medicine

## 2020-06-01 DIAGNOSIS — Z1231 Encounter for screening mammogram for malignant neoplasm of breast: Secondary | ICD-10-CM

## 2020-06-07 HISTORY — PX: PARATHYROIDECTOMY: SHX19

## 2020-06-23 ENCOUNTER — Other Ambulatory Visit: Payer: Self-pay

## 2020-06-23 ENCOUNTER — Ambulatory Visit
Admission: RE | Admit: 2020-06-23 | Discharge: 2020-06-23 | Disposition: A | Discharge status: Home / Self Care | Payer: BLUE CROSS/BLUE SHIELD | Source: Ambulatory Visit | Attending: Family Medicine | Admitting: Family Medicine

## 2020-06-23 DIAGNOSIS — Z1231 Encounter for screening mammogram for malignant neoplasm of breast: Secondary | ICD-10-CM | POA: Insufficient documentation

## 2020-07-24 ENCOUNTER — Encounter: Payer: Self-pay | Admitting: Emergency Medicine

## 2020-07-24 ENCOUNTER — Emergency Department: Payer: BLUE CROSS/BLUE SHIELD

## 2020-07-24 ENCOUNTER — Emergency Department
Admission: EM | Admit: 2020-07-24 | Discharge: 2020-07-24 | Disposition: A | Discharge status: Home / Self Care | Payer: BLUE CROSS/BLUE SHIELD | Attending: Emergency Medicine | Admitting: Emergency Medicine

## 2020-07-24 ENCOUNTER — Other Ambulatory Visit: Payer: Self-pay

## 2020-07-24 DIAGNOSIS — I2510 Atherosclerotic heart disease of native coronary artery without angina pectoris: Secondary | ICD-10-CM | POA: Diagnosis not present

## 2020-07-24 DIAGNOSIS — Z91040 Latex allergy status: Secondary | ICD-10-CM | POA: Diagnosis not present

## 2020-07-24 DIAGNOSIS — I129 Hypertensive chronic kidney disease with stage 1 through stage 4 chronic kidney disease, or unspecified chronic kidney disease: Secondary | ICD-10-CM | POA: Diagnosis not present

## 2020-07-24 DIAGNOSIS — M79671 Pain in right foot: Secondary | ICD-10-CM | POA: Insufficient documentation

## 2020-07-24 DIAGNOSIS — Z7982 Long term (current) use of aspirin: Secondary | ICD-10-CM | POA: Diagnosis not present

## 2020-07-24 DIAGNOSIS — M10371 Gout due to renal impairment, right ankle and foot: Secondary | ICD-10-CM | POA: Insufficient documentation

## 2020-07-24 DIAGNOSIS — N183 Chronic kidney disease, stage 3 unspecified: Secondary | ICD-10-CM | POA: Insufficient documentation

## 2020-07-24 DIAGNOSIS — Z79899 Other long term (current) drug therapy: Secondary | ICD-10-CM | POA: Insufficient documentation

## 2020-07-24 HISTORY — DX: Disorder of kidney and ureter, unspecified: N28.9

## 2020-07-24 LAB — CBC WITH DIFFERENTIAL/PLATELET
Abs Immature Granulocytes: 0.04 10*3/uL (ref 0.00–0.07)
Basophils Absolute: 0 10*3/uL (ref 0.0–0.1)
Basophils Relative: 0 %
Eosinophils Absolute: 0.2 10*3/uL (ref 0.0–0.5)
Eosinophils Relative: 3 %
HCT: 40.1 % (ref 36.0–46.0)
Hemoglobin: 12.4 g/dL (ref 12.0–15.0)
Immature Granulocytes: 1 %
Lymphocytes Relative: 28 %
Lymphs Abs: 1.7 10*3/uL (ref 0.7–4.0)
MCH: 28 pg (ref 26.0–34.0)
MCHC: 30.9 g/dL (ref 30.0–36.0)
MCV: 90.5 fL (ref 80.0–100.0)
Monocytes Absolute: 0.5 10*3/uL (ref 0.1–1.0)
Monocytes Relative: 9 %
Neutro Abs: 3.6 10*3/uL (ref 1.7–7.7)
Neutrophils Relative %: 59 %
Platelets: 244 10*3/uL (ref 150–400)
RBC: 4.43 MIL/uL (ref 3.87–5.11)
RDW: 14 % (ref 11.5–15.5)
WBC: 6.1 10*3/uL (ref 4.0–10.5)
nRBC: 0 % (ref 0.0–0.2)

## 2020-07-24 LAB — COMPREHENSIVE METABOLIC PANEL
ALT: 18 U/L (ref 0–44)
AST: 18 U/L (ref 15–41)
Albumin: 3.2 g/dL — ABNORMAL LOW (ref 3.5–5.0)
Alkaline Phosphatase: 114 U/L (ref 38–126)
Anion gap: 8 (ref 5–15)
BUN: 33 mg/dL — ABNORMAL HIGH (ref 6–20)
CO2: 25 mmol/L (ref 22–32)
Calcium: 9.1 mg/dL (ref 8.9–10.3)
Chloride: 106 mmol/L (ref 98–111)
Creatinine, Ser: 2.28 mg/dL — ABNORMAL HIGH (ref 0.44–1.00)
GFR, Estimated: 26 mL/min — ABNORMAL LOW (ref >60)
Glucose, Bld: 94 mg/dL (ref 70–99)
Potassium: 3.9 mmol/L (ref 3.5–5.1)
Sodium: 139 mmol/L (ref 135–145)
Total Bilirubin: 0.7 mg/dL (ref 0.3–1.2)
Total Protein: 7.6 g/dL (ref 6.5–8.1)

## 2020-07-24 LAB — URIC ACID: Uric Acid, Serum: 8.7 mg/dL — ABNORMAL HIGH (ref 2.5–7.1)

## 2020-07-24 MED ORDER — NEOSPORIN PLUS PAIN RELIEF MS 3.5-10000-10 EX CREA: TOPICAL_CREAM | Freq: Two times a day (BID) | CUTANEOUS | 0 refills | Status: DC

## 2020-07-24 MED ORDER — INDOMETHACIN 50 MG PO CAPS: 50.0000 mg | ORAL_CAPSULE | Freq: Three times a day (TID) | ORAL | 0 refills | Status: DC

## 2020-07-24 MED ORDER — KETOROLAC TROMETHAMINE 30 MG/ML IJ SOLN
60.0000 mg | Freq: Once | INTRAMUSCULAR | Status: AC
  Administered 2020-07-24: 60 mg via INTRAMUSCULAR
  Filled 2020-07-24: qty 2

## 2020-07-24 MED ORDER — COLCHICINE 0.6 MG PO TABS: 0.6000 mg | ORAL_TABLET | Freq: Every day | ORAL | 0 refills | Status: DC

## 2020-07-24 MED ORDER — HYDROMORPHONE HCL 1 MG/ML IJ SOLN
1.0000 mg | Freq: Once | INTRAMUSCULAR | Status: AC
  Administered 2020-07-24: 1 mg via INTRAMUSCULAR
  Filled 2020-07-24: qty 1

## 2020-07-24 NOTE — ED Triage Notes (Signed)
Arrives with C/O right foot pain and swelling today.  States awoke this morning with symptoms.  Denies injury.

## 2020-07-24 NOTE — ED Notes (Signed)
Confirmed with patient that she had a ride to get back home and that she did not drive herself in. Pt states someone dropped her off and will pick her up when discharged.

## 2020-07-24 NOTE — Discharge Instructions (Addendum)
Follow discharge care instructions and take medication as directed.

## 2020-07-24 NOTE — ED Provider Notes (Signed)
[facility] Emergency Department Provider Note   ____________________________________________   Event Date/Time   First MD Initiated Contact with Patient 07/24/20 [phone]     (approximate)  I have reviewed the triage vital signs and the nursing notes.   HISTORY  Chief Complaint Foot Pain    HPI [name] is a 50 y.o. female patient working with increased pain and swelling to the right foot.  Patient denies provocative testing for complaint.  Patient rates pain as 8/10.  Patient described pain is "achy".  No palliative measure for complaint.  Patient states pain is dorsal lateral aspect of foot.  Patient states pain increases with moving toes and ambulation.         Past Medical History:  Diagnosis Date  . Anemia   . Coronary artery disease   . Hypertension   . MI (myocardial infarction) ([place])   . Renal disorder    CKD III    Patient Active Problem List   Diagnosis Date Noted  . Fibroid uterus 11/04/2016  . Iron deficiency anemia due to chronic blood loss   . NSTEMI (non-ST elevated myocardial infarction) ([place]) 07/29/2016  . Severe anemia 09/13/2015  . IRREGULAR MENSES 05/28/2007  . COUGH 04/12/2007  . OBESITY, MORBID 11/24/2006  . ANEMIA-IRON DEFICIENCY 11/24/2006  . HYPERTENSION 11/24/2006    Past Surgical History:  Procedure Laterality Date  . CYSTOSCOPY Bilateral 11/06/2016   Procedure: CYSTOSCOPY with stent placement;  Surgeon: Benjaman Kindler, MD;  Location: [facility] ORS;  Service: Gynecology;  Laterality: Bilateral;  . HYSTERECTOMY ABDOMINAL WITH SALPINGECTOMY Left 11/06/2016   Procedure: HYSTERECTOMY ABDOMINAL WITH SALPINGECTOMY;  Surgeon: Benjaman Kindler, MD;  Location: [facility] ORS;  Service: Gynecology;  Laterality: Left;  . LEFT HEART CATH AND CORONARY ANGIOGRAPHY Right 08/01/2016   Procedure: Left Heart Cath and Coronary Angiography;  Surgeon: Dionisio David, MD;  Location: [facility];  Service: Cardiovascular;   Laterality: Right;    Prior to Admission medications   Medication Sig Start Date End Date Taking? Authorizing Provider  colchicine 0.6 MG tablet Take 1 tablet (0.6 mg total) by mouth daily. 07/24/20 07/24/21 Yes Sable Feil, PA-C  indomethacin (INDOCIN) 50 MG capsule Take 1 capsule (50 mg total) by mouth 3 (three) times daily with meals. 07/24/20  Yes Sable Feil, PA-C  acetaminophen (TYLENOL) 500 MG tablet Take 2 tablets (1,000 mg total) by mouth every 6 (six) hours. 11/10/16   Benjaman Kindler, MD  amLODipine (NORVASC) 5 MG tablet Take 1 tablet (5 mg total) by mouth daily. 11/11/16   Benjaman Kindler, MD  aspirin 325 MG tablet Take 1 tablet (325 mg total) by mouth daily. Patient taking differently: Take 81 mg by mouth 2 (two) times daily.  08/03/16   Demetrios Loll, MD  atorvastatin (LIPITOR) 40 MG tablet Take 1 tablet (40 mg total) by mouth daily at 6 PM. 08/02/16   Demetrios Loll, MD  docusate sodium (COLACE) 100 MG capsule Take 1 capsule (100 mg total) by mouth 2 (two) times daily. Patient not taking: Reported on 07/29/2016 09/14/15   Schermerhorn, Gwen Her, MD  furosemide (LASIX) 20 MG tablet Take 20 mg by mouth daily.    [provider]  hydrALAZINE (APRESOLINE) 100 MG tablet Take 1 tablet (100 mg total) by mouth 3 (three) times daily. 11/10/16   Benjaman Kindler, MD  metoprolol tartrate (LOPRESSOR) 100 MG tablet Take 1 tablet (100 mg total) by mouth 2 (two) times daily. 11/10/16   Benjaman Kindler, MD  ondansetron (ZOFRAN) 4 MG tablet Take 1 tablet (4 mg total) by mouth every 6 (six) hours as needed for nausea. Patient not taking: Reported on 09/05/2016 09/14/15   Schermerhorn, Gwen Her, MD  oxycodone (OXY-IR) 5 MG capsule Take 1 capsule (5 mg total) by mouth every 6 (six) hours as needed for pain. 11/10/16   Benjaman Kindler, MD    Allergies Shrimp [shellfish allergy], Ace inhibitors, and Latex  Family History  Problem Relation Age of Onset  . CAD Father   . Breast cancer Neg Hx      Social History Social History   Tobacco Use  . Smoking status: Never Smoker  . Smokeless tobacco: Never Used  Substance Use Topics  . Alcohol use: No  . Drug use: No    Review of Systems Constitutional: No fever/chills Eyes: No visual changes. ENT: No sore throat. Cardiovascular: Denies chest pain. Respiratory: Denies shortness of breath. Gastrointestinal: No abdominal pain.  No nausea, no vomiting.  No diarrhea.  No constipation. Genitourinary: Negative for dysuria. Musculoskeletal: Right foot pain. Skin: Negative for rash. Neurological: Negative for headaches, focal weakness or numbness. Endocrine:  CKD, hypertension, and hyperparathyroidism Hematological/Lymphatic:  Anemia Allergic/Immunilogical: Shellfish, ACE inhibitor, and latex. ____________________________________________   PHYSICAL EXAM:  VITAL SIGNS: ED Triage Vitals  Enc Vitals Group     BP 07/24/20 0719 (!) 149/98     Pulse Rate 07/24/20 0719 79     Resp 07/24/20 0719 18     Temp 07/24/20 0719 98.4 F (36.9 C)     Temp Source 07/24/20 0719 Oral     SpO2 07/24/20 0719 100 %     Weight 07/24/20 0717 287 lb 0.6 oz (130.2 kg)     Height 07/24/20 0717 5' 4.8" (1.646 m)     Head Circumference --      Peak Flow --      Pain Score 07/24/20 0717 8     Pain Loc --      Pain Edu? --      Excl. in [place]? --    Constitutional: Alert and oriented. Well appearing and in no acute distress.  BMI is 53.38 Cardiovascular: Normal rate, regular rhythm. Grossly normal heart sounds.  Good peripheral circulation. Respiratory: Normal respiratory effort.  No retractions. Lungs CTAB. Gastrointestinal: Soft and nontender. No distention. No abdominal bruits. No CVA tenderness. Genitourinary: Deferred Musculoskeletal: No obvious deformity.  1+ bilateral nonpitting edema.  Moderate guarding palpation of the right dorsal lateral foot. Neurologic:  Normal speech and language. No gross focal neurologic deficits are appreciated. No  gait instability. Skin:  Skin is warm, dry and intact. No rash noted. Psychiatric: Mood and affect are normal. Speech and behavior are normal.  ____________________________________________   LABS (all labs ordered are listed, but only abnormal results are displayed)  Labs Reviewed  COMPREHENSIVE METABOLIC PANEL - Abnormal; Notable for the following components:      Result Value   BUN 33 (*)    Creatinine, Ser 2.28 (*)    Albumin 3.2 (*)    GFR, Estimated 26 (*)    All other components within normal limits  URIC ACID - Abnormal; Notable for the following components:   Uric Acid, Serum 8.7 (*)    All other components within normal limits  CBC WITH DIFFERENTIAL/PLATELET   ____________________________________________  EKG   ____________________________________________  RADIOLOGY I, Sable Feil, personally viewed and evaluated these images (plain radiographs) as part of my medical decision making, as well as reviewing the written report  by the radiologist.  ED MD interpretation: No acute findings x-ray of the right foot.  Official radiology report(s): DG Foot Complete Right  Result Date: 07/24/2020 CLINICAL DATA:  Nontraumatic anterior foot pain and swelling EXAM: RIGHT FOOT COMPLETE - 3+ VIEW COMPARISON:  None. FINDINGS: Soft tissue swelling/thickening is present at the dorsal foot and ankle. No fracture, erosion, or soft tissue gas. Plantar heel spur. Osteopenic appearance diffusely. IMPRESSION: Soft tissue swelling without acute or focal osseous finding. Electronically Signed   By: Monte Fantasia M.D.   On: 07/24/2020 08:41    ____________________________________________   PROCEDURES  Procedure(s) performed (including Critical Care):  Procedures   ____________________________________________   INITIAL IMPRESSION / ASSESSMENT AND PLAN / ED COURSE  As part of my medical decision making, I reviewed the following data within the [place]          Patient presents and waking right foot pain.  No provocative incident for complaint.  Discussed x-rays and lab results with patient.  Patient complaint physical exam is consistent with gout.  Patient given discharge care instruction advised take medication as directed.  Patient advised to follow-up PCP and make them aware of this new finding.      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 She was evaluated in the context of the global COVID-19 pandemic, which necessitated consideration that the patient might be at risk for infection with the SARS-CoV-2 virus that causes COVID-19. Institutional protocols and algorithms that pertain to the evaluation of patients at risk for COVID-19 are in a state of rapid change based on information released by regulatory bodies including the CDC and federal and state organizations. These policies and algorithms were followed during the patient's care in the ED.  Some ED evaluations and interventions may be delayed as a result of limited staffing during and the pandemic.*   Note:  This document was prepared using Dragon voice recognition software and may include unintentional dictation errors.    Sable Feil, PA-C 07/24/20 5732    Vladimir Crofts,  MD 07/24/20 1540

## 2020-10-23 ENCOUNTER — Emergency Department: Payer: BLUE CROSS/BLUE SHIELD

## 2020-10-23 ENCOUNTER — Emergency Department
Admission: EM | Admit: 2020-10-23 | Discharge: 2020-10-23 | Disposition: A | Discharge status: Home / Self Care | Payer: BLUE CROSS/BLUE SHIELD | Attending: Emergency Medicine | Admitting: Emergency Medicine

## 2020-10-23 ENCOUNTER — Other Ambulatory Visit: Payer: Self-pay

## 2020-10-23 DIAGNOSIS — I2510 Atherosclerotic heart disease of native coronary artery without angina pectoris: Secondary | ICD-10-CM | POA: Insufficient documentation

## 2020-10-23 DIAGNOSIS — M5416 Radiculopathy, lumbar region: Secondary | ICD-10-CM | POA: Insufficient documentation

## 2020-10-23 DIAGNOSIS — Z91040 Latex allergy status: Secondary | ICD-10-CM | POA: Diagnosis not present

## 2020-10-23 DIAGNOSIS — I129 Hypertensive chronic kidney disease with stage 1 through stage 4 chronic kidney disease, or unspecified chronic kidney disease: Secondary | ICD-10-CM | POA: Diagnosis not present

## 2020-10-23 DIAGNOSIS — Z7982 Long term (current) use of aspirin: Secondary | ICD-10-CM | POA: Insufficient documentation

## 2020-10-23 DIAGNOSIS — N184 Chronic kidney disease, stage 4 (severe): Secondary | ICD-10-CM | POA: Diagnosis not present

## 2020-10-23 DIAGNOSIS — Z79899 Other long term (current) drug therapy: Secondary | ICD-10-CM | POA: Insufficient documentation

## 2020-10-23 DIAGNOSIS — M25551 Pain in right hip: Secondary | ICD-10-CM | POA: Diagnosis present

## 2020-10-23 MED ORDER — PREDNISONE 20 MG PO TABS: 20.0000 mg | ORAL_TABLET | Freq: Two times a day (BID) | ORAL | 0 refills | Status: AC

## 2020-10-23 MED ORDER — PREDNISONE 20 MG PO TABS: 20.0000 mg | ORAL_TABLET | Freq: Two times a day (BID) | ORAL | 0 refills | Status: DC

## 2020-10-23 NOTE — ED Triage Notes (Signed)
Pt c/o of R hip and leg pain. A&O, ambulatory. Denies fall/injury.

## 2020-10-23 NOTE — ED Provider Notes (Signed)
[facility]-EMERGENCY DEPARTMENT  ____________________________________________  Time seen: Approximately 4:46 PM  I have reviewed the triage vital signs and the nursing notes.   HISTORY  Chief Complaint Hip Pain and Back Pain   Historian Patient    HPI [name] is a 50 y.o. female presents to the emergency department with right lateral hip pain that radiates to the right ankle.  Patient denies dysuria, increased urinary frequency, fever or nausea.  Patient states that she has experienced these symptoms in the past.  She denies recent falls or mechanisms of trauma.  Patient has stage IV chronic kidney disease and cannot tolerate anti-inflammatories at home.  Patient states that she has taken 40 mg of prednisone in the past which is resolved her symptoms.   Past Medical History:  Diagnosis Date   Anemia    Coronary artery disease    Hypertension    MI (myocardial infarction) ([place])    Renal disorder    CKD III     Immunizations up to date:  Yes.     Past Medical History:  Diagnosis Date   Anemia    Coronary artery disease    Hypertension    MI (myocardial infarction) ([place])    Renal disorder    CKD III    Patient Active Problem List   Diagnosis Date Noted   Fibroid uterus 11/04/2016   Iron deficiency anemia due to chronic blood loss    NSTEMI (non-ST elevated myocardial infarction) ([place]) 07/29/2016   Severe anemia 09/13/2015   IRREGULAR MENSES 05/28/2007   COUGH 04/12/2007   OBESITY, MORBID 11/24/2006   ANEMIA-IRON DEFICIENCY 11/24/2006   HYPERTENSION 11/24/2006    Past Surgical History:  Procedure Laterality Date   CYSTOSCOPY Bilateral 11/06/2016   Procedure: CYSTOSCOPY with stent placement;  Surgeon: Benjaman Kindler, MD;  Location: [facility] ORS;  Service: Gynecology;  Laterality: Bilateral;   HYSTERECTOMY ABDOMINAL WITH SALPINGECTOMY Left 11/06/2016   Procedure: HYSTERECTOMY ABDOMINAL WITH SALPINGECTOMY;  Surgeon: Benjaman Kindler, MD;  Location: [facility]  ORS;  Service: Gynecology;  Laterality: Left;   LEFT HEART CATH AND CORONARY ANGIOGRAPHY Right 08/01/2016   Procedure: Left Heart Cath and Coronary Angiography;  Surgeon: Dionisio David, MD;  Location: [facility];  Service: Cardiovascular;  Laterality: Right;   PARATHYROIDECTOMY      Prior to Admission medications   Medication Sig Start Date End Date Taking? Authorizing Provider  predniSONE (DELTASONE) 20 MG tablet Take 1 tablet (20 mg total) by mouth 2 (two) times daily for 5 days. 10/23/20 10/28/20 Yes Vallarie Mare M, PA-C  acetaminophen (TYLENOL) 500 MG tablet Take 2 tablets (1,000 mg total) by mouth every 6 (six) hours. 11/10/16   Benjaman Kindler, MD  amLODipine (NORVASC) 5 MG tablet Take 1 tablet (5 mg total) by mouth daily. 11/11/16   Benjaman Kindler, MD  aspirin 325 MG tablet Take 1 tablet (325 mg total) by mouth daily. Patient taking differently: Take 81 mg by mouth 2 (two) times daily.  08/03/16   Demetrios Loll, MD  atorvastatin (LIPITOR) 40 MG tablet Take 1 tablet (40 mg total) by mouth daily at 6 PM. 08/02/16   Demetrios Loll, MD  colchicine 0.6 MG tablet Take 1 tablet (0.6 mg total) by mouth daily. 07/24/20 07/24/21  Sable Feil, PA-C  docusate sodium (COLACE) 100 MG capsule Take 1 capsule (100 mg total) by mouth 2 (two) times daily. Patient not taking: Reported on 07/29/2016 09/14/15   Schermerhorn, Gwen Her, MD  furosemide (LASIX) 20 MG tablet Take  20 mg by mouth daily.    [provider]  hydrALAZINE (APRESOLINE) 100 MG tablet Take 1 tablet (100 mg total) by mouth 3 (three) times daily. 11/10/16   Benjaman Kindler, MD  metoprolol tartrate (LOPRESSOR) 100 MG tablet Take 1 tablet (100 mg total) by mouth 2 (two) times daily. 11/10/16   Benjaman Kindler, MD  ondansetron (ZOFRAN) 4 MG tablet Take 1 tablet (4 mg total) by mouth every 6 (six) hours as needed for nausea. Patient not taking: Reported on 09/05/2016 09/14/15   Schermerhorn, Gwen Her, MD  oxycodone (OXY-IR) 5 MG capsule  Take 1 capsule (5 mg total) by mouth every 6 (six) hours as needed for pain. 11/10/16   Benjaman Kindler, MD    Allergies Shrimp [shellfish allergy], Ace inhibitors, and Latex  Family History  Problem Relation Age of Onset   CAD Father    Breast cancer Neg Hx     Social History Social History   Tobacco Use   Smoking status: Never   Smokeless tobacco: Never  Substance Use Topics   Alcohol use: No   Drug use: No     Review of Systems  Constitutional: No fever/chills Eyes:  No discharge ENT: No upper respiratory complaints. Respiratory: no cough. No SOB/ use of accessory muscles to breath Gastrointestinal:   No nausea, no vomiting.  No diarrhea.  No constipation. Musculoskeletal: Patient has right lateral hip pain.  Skin: Negative for rash, abrasions, lacerations, ecchymosis.   ____________________________________________   PHYSICAL EXAM:  VITAL SIGNS: ED Triage Vitals  Enc Vitals Group     BP 10/23/20 1517 (!) 147/97     Pulse Rate 10/23/20 1517 (!) 55     Resp 10/23/20 1517 18     Temp 10/23/20 1517 98.4 F (36.9 C)     Temp Source 10/23/20 1517 Oral     SpO2 10/23/20 1517 96 %     Weight 10/23/20 1516 (!) 319 lb (144.7 kg)     Height 10/23/20 1516 5\' 3"  (1.6 m)     Head Circumference --      Peak Flow --      Pain Score 10/23/20 1516 10     Pain Loc --      Pain Edu? --      Excl. in [place]? --      Constitutional: Alert and oriented. Well appearing and in no acute distress. Eyes: Conjunctivae are normal. PERRL. EOMI. Head: Atraumatic. ENT:  Cardiovascular: Normal rate, regular rhythm. Normal S1 and S2.  Good peripheral circulation. Respiratory: Normal respiratory effort without tachypnea or retractions. Lungs CTAB. Good air entry to the bases with no decreased or absent breath sounds Gastrointestinal: Bowel sounds x 4 quadrants. Soft and nontender to palpation. No guarding or rigidity. No distention. Musculoskeletal: Full range of motion to all  extremities. No obvious deformities noted.  Positive straight leg raise test, right. Neurologic:  Normal for age. No gross focal neurologic deficits are appreciated.  Skin:  Skin is warm, dry and intact. No rash noted. Psychiatric: Mood and affect are normal for age. Speech and behavior are normal.   ____________________________________________   LABS (all labs ordered are listed, but only abnormal results are displayed)  Labs Reviewed - No data to display ____________________________________________  EKG   ____________________________________________  RADIOLOGY Unk Pinto, personally viewed and evaluated these images (plain radiographs) as part of my medical decision making, as well as reviewing the written report by the radiologist.  DG Hip Unilat W or Wo  Pelvis 2-3 Views Right  Result Date: 10/23/2020 CLINICAL DATA:  Right hip pain x3 days. EXAM: DG HIP (WITH OR WITHOUT PELVIS) 2-3V RIGHT COMPARISON:  None. FINDINGS: There is no evidence of hip fracture or dislocation. There is no evidence of arthropathy or other focal bone abnormality. IMPRESSION: Negative. Electronically Signed   By: Virgina Norfolk M.D.   On: 10/23/2020 15:45    ____________________________________________    PROCEDURES  Procedure(s) performed:     Procedures     Medications - No data to display   ____________________________________________   INITIAL IMPRESSION / ASSESSMENT AND PLAN / ED COURSE  Pertinent labs & imaging results that were available during my care of the patient were reviewed by me and considered in my medical decision making (see chart for details).      Assessment and plan Lumbar radiculopathy 50 year old female presents to the emergency department with right-sided low back pain that radiates along the lateral aspect of the right lower extremity.  Patient was started on prednisone twice daily for the next 5 days.  She is advised to follow-up with primary care as  needed.  All patient questions were answered.     ____________________________________________  FINAL CLINICAL IMPRESSION(S) / ED DIAGNOSES  Final diagnoses:  Lumbar radiculopathy      NEW MEDICATIONS STARTED DURING THIS VISIT:  ED Discharge Orders          Ordered    predniSONE (DELTASONE) 20 MG tablet  2 times daily        10/23/20 1643                This chart was dictated using voice recognition software/Dragon. Despite best efforts to proofread, errors can occur which can change the meaning. Any change was purely unintentional.     Lannie Fields, PA-C 10/23/20 1650    Vanessa [place], MD 10/24/20 [phone]

## 2020-10-23 NOTE — ED Provider Notes (Signed)
Emergency Medicine Provider Triage Evaluation Note  [name] , a 50 y.o. female  was evaluated in triage.  Pt complains of right hip pain x 3 days radiating towards knee. Reports hx of bilateral knee pain that is still present but reports this is her norm. Denies any new falls or trauma. Pain is worse when sitting or lying on the hip, made better by standing.  Review of Systems  Positive: Right hip pain Negative: Fever, chills, weakness  Physical Exam  BP (!) 147/97 (BP Location: Left Wrist)   Pulse (!) 55   Temp 98.4 F (36.9 C) (Oral)   Resp 18   Ht 5\' 3"  (1.6 m)   Wt (!) 144.7 kg   LMP  (LMP Unknown)   SpO2 96%   BMI 56.51 kg/m  Gen:   Awake, no distress   Resp:  Normal effort  MSK:   Moves extremities without difficulty  Other:    Medical Decision Making  Medically screening exam initiated at 3:21 PM.  Appropriate orders placed.  AHNNA DUNGAN was informed that the remainder of the evaluation will be completed by another provider, this initial triage assessment does not replace that evaluation, and the importance of remaining in the ED until their evaluation is complete.     Marlana Salvage, PA 10/23/20 1522    Lucrezia Starch, MD 10/23/20 [phone]

## 2020-10-23 NOTE — Discharge Instructions (Addendum)
Take Prednisone twice daily for five days.

## 2020-12-21 ENCOUNTER — Other Ambulatory Visit
Admission: RE | Admit: 2020-12-21 | Discharge: 2020-12-21 | Disposition: A | Discharge status: Home / Self Care | Payer: BLUE CROSS/BLUE SHIELD | Attending: Nephrology | Admitting: Nephrology

## 2020-12-21 DIAGNOSIS — N184 Chronic kidney disease, stage 4 (severe): Secondary | ICD-10-CM | POA: Insufficient documentation

## 2020-12-21 LAB — RENAL FUNCTION PANEL
Albumin: 3.5 g/dL (ref 3.5–5.0)
Anion gap: 9 (ref 5–15)
BUN: 38 mg/dL — ABNORMAL HIGH (ref 6–20)
CO2: 23 mmol/L (ref 22–32)
Calcium: 9.4 mg/dL (ref 8.9–10.3)
Chloride: 106 mmol/L (ref 98–111)
Creatinine, Ser: 2.55 mg/dL — ABNORMAL HIGH (ref 0.44–1.00)
GFR, Estimated: 22 mL/min — ABNORMAL LOW (ref >60)
Glucose, Bld: 102 mg/dL — ABNORMAL HIGH (ref 70–99)
Phosphorus: 3.9 mg/dL (ref 2.5–4.6)
Potassium: 4.1 mmol/L (ref 3.5–5.1)
Sodium: 138 mmol/L (ref 135–145)

## 2020-12-21 LAB — CBC WITH DIFFERENTIAL/PLATELET
Abs Immature Granulocytes: 0.02 10*3/uL (ref 0.00–0.07)
Basophils Absolute: 0 10*3/uL (ref 0.0–0.1)
Basophils Relative: 0 %
Eosinophils Absolute: 0.4 10*3/uL (ref 0.0–0.5)
Eosinophils Relative: 6 %
HCT: 42.5 % (ref 36.0–46.0)
Hemoglobin: 13.3 g/dL (ref 12.0–15.0)
Immature Granulocytes: 0 %
Lymphocytes Relative: 34 %
Lymphs Abs: 2.3 10*3/uL (ref 0.7–4.0)
MCH: 27.9 pg (ref 26.0–34.0)
MCHC: 31.3 g/dL (ref 30.0–36.0)
MCV: 89.1 fL (ref 80.0–100.0)
Monocytes Absolute: 0.5 10*3/uL (ref 0.1–1.0)
Monocytes Relative: 8 %
Neutro Abs: 3.5 10*3/uL (ref 1.7–7.7)
Neutrophils Relative %: 52 %
Platelets: 300 10*3/uL (ref 150–400)
RBC: 4.77 MIL/uL (ref 3.87–5.11)
RDW: 14.2 % (ref 11.5–15.5)
WBC: 6.8 10*3/uL (ref 4.0–10.5)
nRBC: 0 % (ref 0.0–0.2)

## 2020-12-21 LAB — IRON AND TIBC
Iron: 60 ug/dL (ref 28–170)
Saturation Ratios: 17 % (ref 10.4–31.8)
TIBC: 347 ug/dL (ref 250–450)
UIBC: 287 ug/dL

## 2020-12-21 LAB — URIC ACID: Uric Acid, Serum: 4.4 mg/dL (ref 2.5–7.1)

## 2020-12-22 LAB — PTH, INTACT AND CALCIUM
Calcium, Total (PTH): 9.4 mg/dL (ref 8.7–10.2)
PTH: 260 pg/mL — ABNORMAL HIGH (ref 15–65)

## 2021-03-21 ENCOUNTER — Other Ambulatory Visit: Payer: Self-pay

## 2021-03-21 ENCOUNTER — Other Ambulatory Visit
Admission: RE | Admit: 2021-03-21 | Discharge: 2021-03-21 | Disposition: A | Discharge status: Home / Self Care | Payer: BLUE CROSS/BLUE SHIELD | Attending: Nephrology | Admitting: Nephrology

## 2021-03-21 DIAGNOSIS — N184 Chronic kidney disease, stage 4 (severe): Secondary | ICD-10-CM | POA: Insufficient documentation

## 2021-03-21 LAB — RENAL FUNCTION PANEL
Albumin: 3.5 g/dL (ref 3.5–5.0)
Anion gap: 9 (ref 5–15)
BUN: 42 mg/dL — ABNORMAL HIGH (ref 6–20)
CO2: 23 mmol/L (ref 22–32)
Calcium: 9.7 mg/dL (ref 8.9–10.3)
Chloride: 107 mmol/L (ref 98–111)
Creatinine, Ser: 2.51 mg/dL — ABNORMAL HIGH (ref 0.44–1.00)
GFR, Estimated: 23 mL/min — ABNORMAL LOW (ref >60)
Glucose, Bld: 79 mg/dL (ref 70–99)
Phosphorus: 4.2 mg/dL (ref 2.5–4.6)
Potassium: 4.2 mmol/L (ref 3.5–5.1)
Sodium: 139 mmol/L (ref 135–145)

## 2021-03-21 LAB — CBC WITH DIFFERENTIAL/PLATELET
Abs Immature Granulocytes: 0.01 10*3/uL (ref 0.00–0.07)
Basophils Absolute: 0 10*3/uL (ref 0.0–0.1)
Basophils Relative: 0 %
Eosinophils Absolute: 0.2 10*3/uL (ref 0.0–0.5)
Eosinophils Relative: 4 %
HCT: 42.1 % (ref 36.0–46.0)
Hemoglobin: 12.7 g/dL (ref 12.0–15.0)
Immature Granulocytes: 0 %
Lymphocytes Relative: 38 %
Lymphs Abs: 2.1 10*3/uL (ref 0.7–4.0)
MCH: 27 pg (ref 26.0–34.0)
MCHC: 30.2 g/dL (ref 30.0–36.0)
MCV: 89.4 fL (ref 80.0–100.0)
Monocytes Absolute: 0.5 10*3/uL (ref 0.1–1.0)
Monocytes Relative: 9 %
Neutro Abs: 2.7 10*3/uL (ref 1.7–7.7)
Neutrophils Relative %: 49 %
Platelets: 286 10*3/uL (ref 150–400)
RBC: 4.71 MIL/uL (ref 3.87–5.11)
RDW: 14.6 % (ref 11.5–15.5)
WBC: 5.5 10*3/uL (ref 4.0–10.5)
nRBC: 0 % (ref 0.0–0.2)

## 2021-04-04 HISTORY — PX: LEFT HEART CATH AND CORONARY ANGIOGRAPHY: CATH118249

## 2021-05-31 ENCOUNTER — Other Ambulatory Visit: Payer: Self-pay | Admitting: Family Medicine

## 2021-05-31 DIAGNOSIS — Z1231 Encounter for screening mammogram for malignant neoplasm of breast: Secondary | ICD-10-CM

## 2021-08-01 ENCOUNTER — Ambulatory Visit
Admission: RE | Admit: 2021-08-01 | Discharge: 2021-08-01 | Disposition: A | Discharge status: Home / Self Care | Payer: BLUE CROSS/BLUE SHIELD | Source: Ambulatory Visit | Attending: Family Medicine | Admitting: Family Medicine

## 2021-08-01 DIAGNOSIS — Z1231 Encounter for screening mammogram for malignant neoplasm of breast: Secondary | ICD-10-CM | POA: Insufficient documentation

## 2022-05-10 ENCOUNTER — Other Ambulatory Visit: Payer: Self-pay

## 2022-05-10 DIAGNOSIS — N184 Chronic kidney disease, stage 4 (severe): Secondary | ICD-10-CM

## 2022-05-15 ENCOUNTER — Other Ambulatory Visit (INDEPENDENT_AMBULATORY_CARE_PROVIDER_SITE_OTHER): Payer: Self-pay

## 2022-05-15 ENCOUNTER — Encounter (INDEPENDENT_AMBULATORY_CARE_PROVIDER_SITE_OTHER): Payer: Self-pay

## 2022-05-15 ENCOUNTER — Encounter (INDEPENDENT_AMBULATORY_CARE_PROVIDER_SITE_OTHER): Payer: Medicaid Other | Admitting: Nurse Practitioner

## 2022-05-26 ENCOUNTER — Other Ambulatory Visit: Payer: Self-pay | Admitting: Family Medicine

## 2022-05-26 DIAGNOSIS — Z1231 Encounter for screening mammogram for malignant neoplasm of breast: Secondary | ICD-10-CM

## 2022-06-14 ENCOUNTER — Ambulatory Visit: Payer: Medicaid Other | Admitting: Podiatry

## 2022-06-14 ENCOUNTER — Encounter: Payer: Self-pay | Admitting: Podiatry

## 2022-06-14 DIAGNOSIS — M79675 Pain in left toe(s): Secondary | ICD-10-CM

## 2022-06-14 DIAGNOSIS — L84 Corns and callosities: Secondary | ICD-10-CM | POA: Diagnosis not present

## 2022-06-14 DIAGNOSIS — B351 Tinea unguium: Secondary | ICD-10-CM | POA: Diagnosis not present

## 2022-06-14 DIAGNOSIS — M79674 Pain in right toe(s): Secondary | ICD-10-CM

## 2022-06-14 NOTE — Patient Instructions (Signed)
Look for salicylic acid [id number] strength cream/ointment or pads and apply to the callus daily

## 2022-06-19 ENCOUNTER — Encounter (INDEPENDENT_AMBULATORY_CARE_PROVIDER_SITE_OTHER): Payer: Self-pay | Admitting: Vascular Surgery

## 2022-06-19 ENCOUNTER — Ambulatory Visit (INDEPENDENT_AMBULATORY_CARE_PROVIDER_SITE_OTHER): Payer: Medicaid Other | Admitting: Vascular Surgery

## 2022-06-19 ENCOUNTER — Ambulatory Visit (INDEPENDENT_AMBULATORY_CARE_PROVIDER_SITE_OTHER): Payer: Medicaid Other

## 2022-06-19 ENCOUNTER — Encounter: Payer: Self-pay | Admitting: Podiatry

## 2022-06-19 VITALS — BP 135/82 | HR 48 | Resp 18 | Ht 64.0 in | Wt 335.6 lb

## 2022-06-19 DIAGNOSIS — K219 Gastro-esophageal reflux disease without esophagitis: Secondary | ICD-10-CM | POA: Diagnosis not present

## 2022-06-19 DIAGNOSIS — N184 Chronic kidney disease, stage 4 (severe): Secondary | ICD-10-CM

## 2022-06-19 DIAGNOSIS — I214 Non-ST elevation (NSTEMI) myocardial infarction: Secondary | ICD-10-CM | POA: Diagnosis not present

## 2022-06-19 DIAGNOSIS — E782 Mixed hyperlipidemia: Secondary | ICD-10-CM

## 2022-06-19 DIAGNOSIS — I10 Essential (primary) hypertension: Secondary | ICD-10-CM

## 2022-06-19 DIAGNOSIS — E785 Hyperlipidemia, unspecified: Secondary | ICD-10-CM | POA: Insufficient documentation

## 2022-06-19 NOTE — Progress Notes (Signed)
  Subjective:  Patient ID: [name], female    DOB: 11-24-1970,  MRN: [id number]  Chief Complaint  Patient presents with   Nail Problem    "I have Gout in my left foot and my toenail needs to be cut."  I also have a callus under the bottom of my right foot."    52 y.o. female presents with the above complaint. History confirmed with patient.  Her nails are thickened elongated she has difficulty cutting them they are discolored.  She is having a fistula created next week, she is soon to go on dialysis  Objective:  Physical Exam: warm, good capillary refill, no trophic changes or ulcerative lesions, normal DP and PT pulses, and normal sensory exam. Left Foot: dystrophic yellowed discolored nail plates with subungual debris Right Foot: dystrophic yellowed discolored nail plates with subungual debris and submetatarsal 5 hyperkeratotic lesion   Assessment:   1. Pain due to onychomycosis of toenails of both feet   2. Callus of foot      Plan:  Patient was evaluated and treated and all questions answered.  Discussed the etiology and treatment options for the condition in detail with the patient. Educated patient on the topical and oral treatment options for mycotic nails. Recommended debridement of the nails today. Sharp and mechanical debridement performed of all painful and mycotic nails today. Nails debrided in length and thickness using a nail nipper to level of comfort. Discussed treatment options including appropriate shoe gear. Follow up as needed for painful nails.  All symptomatic hyperkeratoses were safely debrided as a courtesy with a sterile #15 blade to patient's level of comfort without incident. We discussed preventative and palliative care of these lesions including supportive and accommodative shoegear, padding, prefabricated and custom molded accommodative orthoses, use of a pumice stone and lotions/creams daily.  I dispensed dancers pads to offload the  lesion   Return if symptoms worsen or fail to improve.

## 2022-06-19 NOTE — Progress Notes (Signed)
MRN : [id number]  [name] is a 52 y.o. (1971-04-23) female who presents with chief complaint of check access.  History of Present Illness: The patient is seen for evaluation for dialysis access. The patient has chronic renal insufficiency stage V secondary to hypertension. The patient's most recent creatinine clearance is 17 cc/min on 05/29/2022 (BUN/cr 55/3.28). The patient's volume status has not yet become an issue. Patient's blood pressures been relatively well controlled. There are mild uremic symptoms which appear to be relatively well tolerated at this time.  The patient notes the kidney problem has been present for a long time and has been progressively getting worse.  The patient is followed by nephrology.    The patient is right-handed.  The patient has been considering the various methods of dialysis and wishes to proceed with hemodialysis and therefore creation of AV access.  No recent shortening of the patient's walking distance or new symptoms consistent with claudication.  No history of rest pain symptoms. No new ulcers or wounds of the lower extremities have occurred.  The patient denies amaurosis fugax or recent TIA symptoms. There are no recent neurological changes noted. There is no history of DVT, PE or superficial thrombophlebitis. No recent episodes of angina or shortness of breath documented.  Vein mapping bilateral upper extremities demonstrates the cephalic vein at the wrist is too small for fistula creation bilaterally.  At the level of the antecubital fossa it does appear to be adequate for fistula creation.  Arterial duplex demonstrates triphasic signals throughout.  No outpatient medications have been marked as taking for the 06/19/22 encounter (Appointment) with Delana Meyer, Dolores Lory, MD.    Past Medical History:  Diagnosis Date   Anemia    Coronary artery disease    Hypertension    MI (myocardial infarction) ([place])     Renal disorder    CKD III    Past Surgical History:  Procedure Laterality Date   CYSTOSCOPY Bilateral 11/06/2016   Procedure: CYSTOSCOPY with stent placement;  Surgeon: Benjaman Kindler, MD;  Location: [facility];  Service: Gynecology;  Laterality: Bilateral;   HYSTERECTOMY ABDOMINAL WITH SALPINGECTOMY Left 11/06/2016   Procedure: HYSTERECTOMY ABDOMINAL WITH SALPINGECTOMY;  Surgeon: Benjaman Kindler, MD;  Location: [facility];  Service: Gynecology;  Laterality: Left;   LEFT HEART CATH AND CORONARY ANGIOGRAPHY Right 08/01/2016   Procedure: Left Heart Cath and Coronary Angiography;  Surgeon: Dionisio David, MD;  Location: [facility];  Service: Cardiovascular;  Laterality: Right;   PARATHYROIDECTOMY      Social History Social History   Tobacco Use   Smoking status: Never   Smokeless tobacco: Never  Substance Use Topics   Alcohol use: No   Drug use: No    Family History Family History  Problem Relation Age of Onset   CAD Father    Breast cancer Neg Hx     Allergies  Allergen Reactions   Shrimp [Shellfish Allergy] Anaphylaxis   Ace Inhibitors Cough   Latex Swelling     REVIEW OF SYSTEMS (Negative unless checked)  Constitutional: []$ Weight loss  []$ Fever  []$ Chills Cardiac: []$ Chest pain   []$ Chest pressure   []$ Palpitations   []$ Shortness of breath when laying flat   []$ Shortness of breath with exertion. Vascular:  []$ Pain in legs with walking   []$ Pain in legs at rest  []$ History of DVT   []$ Phlebitis   []$   Swelling in legs   []$ Varicose veins   []$ Non-healing ulcers Pulmonary:   []$ Uses home oxygen   []$ Productive cough   []$ Hemoptysis   []$ Wheeze  []$ COPD   []$ Asthma Neurologic:  []$ Dizziness   []$ Seizures   []$ History of stroke   []$ History of TIA  []$ Aphasia   []$ Vissual changes   []$ Weakness or numbness in arm   []$ Weakness or numbness in leg Musculoskeletal:   []$ Joint swelling   []$ Joint pain   []$ Low back pain Hematologic:  []$ Easy bruising  []$ Easy bleeding   []$ Hypercoagulable state    []$ Anemic Gastrointestinal:  []$ Diarrhea   []$ Vomiting  []$ Gastroesophageal reflux/heartburn   []$ Difficulty swallowing. Genitourinary:  [x]$ Chronic kidney disease   []$ Difficult urination  []$ Frequent urination   []$ Blood in urine Skin:  []$ Rashes   []$ Ulcers  Psychological:  []$ History of anxiety   []$  History of major depression.  Physical Examination  There were no vitals filed for this visit. There is no height or weight on file to calculate BMI. Gen: WD/WN, NAD Head: [place]/AT, No temporalis wasting.  Ear/Nose/Throat: Hearing grossly intact, nares w/o erythema or drainage Eyes: PER, EOMI, sclera nonicteric.  Neck: Supple, no gross masses or lesions.  No JVD.  Pulmonary:  Good air movement, no audible wheezing, no use of accessory muscles.  Cardiac: RRR, precordium non-hyperdynamic. Vascular:   Both arms are very large Vessel Right Left  Radial Palpable Palpable  Brachial Palpable Palpable  Gastrointestinal: soft, non-distended. No guarding/no peritoneal signs.  Musculoskeletal: M/S 5/5 throughout.  No deformity.  Neurologic: CN 2-12 intact. Pain and light touch intact in extremities.  Symmetrical.  Speech is fluent. Motor exam as listed above. Psychiatric: Judgment intact, Mood & affect appropriate for pt's clinical situation. Dermatologic: No rashes or ulcers noted.  No changes consistent with cellulitis.   CBC Lab Results  Component Value Date   WBC 5.5 03/21/2021   HGB 12.7 03/21/2021   HCT 42.1 03/21/2021   MCV 89.4 03/21/2021   PLT 286 03/21/2021    BMET    Component Value Date/Time   NA 139 03/21/2021 1041   K 4.2 03/21/2021 1041   CL 107 03/21/2021 1041   CO2 23 03/21/2021 1041   GLUCOSE 79 03/21/2021 1041   BUN 42 (H) 03/21/2021 1041   CREATININE 2.51 (H) 03/21/2021 1041   CALCIUM 9.7 03/21/2021 1041   CALCIUM 9.4 12/21/2020 1145   GFRNONAA 23 (L) 03/21/2021 1041   GFRAA 37 (L) 07/04/2019 1109   CrCl cannot be calculated (Patient's most recent lab result is older  than the maximum 21 days allowed.).  COAG Lab Results  Component Value Date   INR 1.00 01/08/2017   INR 0.95 08/01/2016   INR 0.89 07/29/2016    Radiology No results found.   Assessment/Plan 1. Stage 4 chronic kidney disease ([place]) Recommend:  At this time the patient does not have appropriate extremity access for dialysis.  Patient is stage IV and therefore is not an urgent need of dialysis access.  Mapping today demonstrates good arterial flow.  She does have a cephalic vein at the level of the antecubital fossa which is adequate.  I have discussed with her that given the size of her arm this will be a two-stage procedure.  We will create the fistula monitor it for maturation in size and then at a point when she is closer to doing dialysis transpose it or superficial eyes it so that it can be readily accessed.  Patient should have a left brachiocephalic created.  As stage I of  2 surgeries.  The risks, benefits and alternative therapies were reviewed in detail with the patient.  All questions were answered.  The patient agrees to proceed with surgery.   The patient will follow up with me in the office after the surgery.  2. NSTEMI (non-ST elevated myocardial infarction) ([place]) Continue cardiac and antihypertensive medications as already ordered and reviewed, no changes at this time.  Patient relates she is followed by Dr. Rodney Langton, [facility] cardiology [place].  Continue statin as ordered and reviewed, no changes at this time  Nitrates PRN for chest pain  3. Essential hypertension Continue antihypertensive medications as already ordered, these medications have been reviewed and there are no changes at this time.  4. Gastroesophageal reflux disease without esophagitis Continue PPI as already ordered, this medication has been reviewed and there are no changes at this time.  Avoidence of caffeine and alcohol  Moderate elevation of the head of the bed   5. Mixed  hyperlipidemia Continue statin as ordered and reviewed, no changes at this time    Hortencia Pilar, MD  06/19/2022 8:36 AM

## 2022-08-04 ENCOUNTER — Ambulatory Visit
Admission: RE | Admit: 2022-08-04 | Discharge: 2022-08-04 | Disposition: A | Discharge status: Home / Self Care | Payer: Medicaid Other | Source: Ambulatory Visit | Attending: Family Medicine | Admitting: Family Medicine

## 2022-08-04 DIAGNOSIS — Z1231 Encounter for screening mammogram for malignant neoplasm of breast: Secondary | ICD-10-CM | POA: Insufficient documentation

## 2022-09-21 ENCOUNTER — Telehealth (INDEPENDENT_AMBULATORY_CARE_PROVIDER_SITE_OTHER): Payer: Self-pay

## 2022-09-21 NOTE — Telephone Encounter (Signed)
Spoke with the patient and she is scheduled with Dr. Gilda Crease on 10/20/22 for a left brachial cephalic fistula at [facility]. Pre-op is on 10/10/22 at 9:00 am at the [facility]. Pre-surgical instructions will be mailed. Patient was offered 10/06/22 and declined.

## 2022-10-10 ENCOUNTER — Other Ambulatory Visit: Payer: Self-pay

## 2022-10-10 ENCOUNTER — Other Ambulatory Visit (INDEPENDENT_AMBULATORY_CARE_PROVIDER_SITE_OTHER): Payer: Self-pay | Admitting: Nurse Practitioner

## 2022-10-10 ENCOUNTER — Encounter
Admission: RE | Admit: 2022-10-10 | Discharge: 2022-10-10 | Disposition: A | Discharge status: Home / Self Care | Payer: Medicaid Other | Source: Ambulatory Visit | Attending: Vascular Surgery | Admitting: Vascular Surgery

## 2022-10-10 VITALS — BP 131/92 | HR 77 | Resp 18 | Ht 64.0 in | Wt 353.2 lb

## 2022-10-10 DIAGNOSIS — I252 Old myocardial infarction: Secondary | ICD-10-CM | POA: Diagnosis not present

## 2022-10-10 DIAGNOSIS — I214 Non-ST elevation (NSTEMI) myocardial infarction: Secondary | ICD-10-CM

## 2022-10-10 DIAGNOSIS — N184 Chronic kidney disease, stage 4 (severe): Secondary | ICD-10-CM

## 2022-10-10 DIAGNOSIS — Z01812 Encounter for preprocedural laboratory examination: Secondary | ICD-10-CM

## 2022-10-10 DIAGNOSIS — Z01818 Encounter for other preprocedural examination: Secondary | ICD-10-CM | POA: Insufficient documentation

## 2022-10-10 DIAGNOSIS — I493 Ventricular premature depolarization: Secondary | ICD-10-CM | POA: Diagnosis not present

## 2022-10-10 HISTORY — DX: Angina pectoris, unspecified: I20.9

## 2022-10-10 HISTORY — DX: Anxiety disorder, unspecified: F41.9

## 2022-10-10 HISTORY — DX: Unspecified osteoarthritis, unspecified site: M19.90

## 2022-10-10 HISTORY — DX: Gastro-esophageal reflux disease without esophagitis: K21.9

## 2022-10-10 HISTORY — DX: Cerebral infarction, unspecified: I63.9

## 2022-10-10 LAB — CBC
HCT: 41 % (ref 36.0–46.0)
Hemoglobin: 12.2 g/dL (ref 12.0–15.0)
MCH: 26.8 pg (ref 26.0–34.0)
MCHC: 29.8 g/dL — ABNORMAL LOW (ref 30.0–36.0)
MCV: 89.9 fL (ref 80.0–100.0)
Platelets: 295 10*3/uL (ref 150–400)
RBC: 4.56 MIL/uL (ref 3.87–5.11)
RDW: 14.8 % (ref 11.5–15.5)
WBC: 6.9 10*3/uL (ref 4.0–10.5)
nRBC: 0 % (ref 0.0–0.2)

## 2022-10-10 LAB — SURGICAL PCR SCREEN
MRSA, PCR: NEGATIVE
Staphylococcus aureus: NEGATIVE

## 2022-10-10 LAB — TYPE AND SCREEN

## 2022-10-10 NOTE — Patient Instructions (Addendum)
Your procedure is scheduled on: 10/20/22 - Friday Report to the Registration Desk on the 1st floor of the [facility]. To find out your arrival time, please call [phone] between 1PM - 3PM on: 10/19/22 - Thursday If your arrival time is 6:00 am, do not arrive before that time as the [facility] entrance doors do not open until 6:00 am.  REMEMBER: Instructions that are not followed completely may result in serious medical risk, up to and including death; or upon the discretion of your surgeon and anesthesiologist your surgery may need to be rescheduled.  Do not eat food or drink any liquids after midnight the night before surgery.  No gum chewing or hard candies.  One week prior to surgery: Stop Anti-inflammatories (NSAIDS) such as Advil, Aleve, Ibuprofen, Motrin, Naproxen, Naprosyn and Aspirin based products such as Excedrin, Goody's Powder, BC Powder.  Stop ANY OVER THE COUNTER supplements until after surgery.  You may however, continue to take Tylenol if needed for pain up until the day of surgery.   TAKE ONLY THESE MEDICATIONS THE MORNING OF SURGERY WITH A SIP OF WATER:  omeprazole (PRILOSEC) - (take one the night before and one on the morning of surgery - helps to prevent nausea after surgery.) amLODipine (NORVASC)  febuxostat (ULORIC)  gabapentin (NEURONTIN)  hydrALAZINE (APRESOLINE)  metoprolol tartrate (LOPRESSOR)    No Alcohol for 24 hours before or after surgery.  No Smoking including e-cigarettes for 24 hours before surgery.  No chewable tobacco products for at least 6 hours before surgery.  No nicotine patches on the day of surgery.  Do not use any "recreational" drugs for at least a week (preferably 2 weeks) before your surgery.  Please be advised that the combination of cocaine and anesthesia may have negative outcomes, up to and including death. If you test positive for cocaine, your surgery will be cancelled.  On the morning of surgery brush your teeth  with toothpaste and water, you may rinse your mouth with mouthwash if you wish. Do not swallow any toothpaste or mouthwash.  Use CHG Soap or wipes as directed on instruction sheet.  Do not wear jewelry, make-up, hairpins, clips or nail polish.  Do not wear lotions, powders, or perfumes.   Do not shave body hair from the neck down 48 hours before surgery.  Contact lenses, hearing aids and dentures may not be worn into surgery.  Do not bring valuables to the hospital. [facility] is not responsible for any missing/lost belongings or valuables.   Notify your doctor if there is any change in your medical condition (cold, fever, infection).  Wear comfortable clothing (specific to your surgery type) to the hospital.  After surgery, you can help prevent lung complications by doing breathing exercises.  Take deep breaths and cough every 1-2 hours. Your doctor may order a device called an Incentive Spirometer to help you take deep breaths. When coughing or sneezing, hold a pillow firmly against your incision with both hands. This is called "splinting." Doing this helps protect your incision. It also decreases belly discomfort.  If you are being admitted to the hospital overnight, leave your suitcase in the car. After surgery it may be brought to your room.  In case of increased patient census, it may be necessary for you, the patient, to continue your postoperative care in the Same Day Surgery department.  If you are being discharged the day of surgery, you will not be allowed to drive home. You will need a responsible  individual to drive you home and stay with you for 24 hours after surgery.   If you are taking public transportation, you will need to have a responsible individual with you.  Please call the Pre-admissions Testing Dept. at [phone] if you have any questions about these instructions.  Surgery Visitation Policy:  Patients having surgery or a procedure may have two  visitors.  Children under the age of 17 must have an adult with them who is not the patient.  Inpatient Visitation:    Visiting hours are 7 a.m. to 8 p.m. Up to four visitors are allowed at one time in a patient room. The visitors may rotate out with other people during the day.  One visitor age 53 or older may stay with the patient overnight and must be in the room by 8 p.m.    Preparing for Surgery with CHLORHEXIDINE GLUCONATE (CHG) Soap  Chlorhexidine Gluconate (CHG) Soap  o An antiseptic cleaner that kills germs and bonds with the skin to continue killing germs even after washing  o Used for showering the night before surgery and morning of surgery  Before surgery, you can play an important role by reducing the number of germs on your skin.  CHG (Chlorhexidine gluconate) soap is an antiseptic cleanser which kills germs and bonds with the skin to continue killing germs even after washing.  Please do not use if you have an allergy to CHG or antibacterial soaps. If your skin becomes reddened/irritated stop using the CHG.  1. Shower the NIGHT BEFORE SURGERY and the MORNING OF SURGERY with CHG soap.  2. If you choose to wash your hair, wash your hair first as usual with your normal shampoo.  3. After shampooing, rinse your hair and body thoroughly to remove the shampoo.  4. Use CHG as you would any other liquid soap. You can apply CHG directly to the skin and wash gently with a scrungie or a clean washcloth.  5. Apply the CHG soap to your body only from the neck down. Do not use on open wounds or open sores. Avoid contact with your eyes, ears, mouth, and genitals (private parts). Wash face and genitals (private parts) with your normal soap.  6. Wash thoroughly, paying special attention to the area where your surgery will be performed.  7. Thoroughly rinse your body with warm water.  8. Do not shower/wash with your normal soap after using and rinsing off the CHG soap.  9. Pat  yourself dry with a clean towel.  10. Wear clean pajamas to bed the night before surgery.  12. Place clean sheets on your bed the night of your first shower and do not sleep with pets.  13. Shower again with the CHG soap on the day of surgery prior to arriving at the hospital.  14. Do not apply any deodorants/lotions/powders.  15. Please wear clean clothes to the hospital.

## 2022-10-17 ENCOUNTER — Encounter: Payer: Self-pay | Admitting: Urgent Care

## 2022-10-17 NOTE — Progress Notes (Signed)
Perioperative / Anesthesia Services  Pre-Admission Testing Clinical Review / Preoperative Anesthesia Consult  Date: 10/19/22  Patient Demographics:  Name: LETTYE Estes DOB:   1970/08/11 MRN:   [id number]  Planned Surgical Procedure(s):    Case: [id number] Date/Time: 10/20/22 1200   Procedure: ARTERIOVENOUS (AV) FISTULA CREATION ( BRACHIALCEPHALIC) (Left)   Anesthesia type: General   Pre-op diagnosis: STAGE 4 CHRONIC KIDNEY DISEASE   Location: [facility] OR ROOM 08 / [facility]   Surgeons: Renford Dills, MD     NOTE: Available PAT nursing documentation and vital signs have been reviewed. Clinical nursing staff has updated patient's PMH/PSHx, current medication list, and drug allergies/intolerances to ensure comprehensive history available to assist in medical decision making as it pertains to the aforementioned surgical procedure and anticipated anesthetic course. Extensive review of available clinical information personally performed. [name] PMH and PSHx updated with any diagnoses/procedures that  may have been inadvertently omitted during her intake with the pre-admission testing department's nursing staff.  Clinical Discussion:  [name] is a 52 y.o. female who is submitted for pre-surgical anesthesia review and clearance prior to her undergoing the above procedure. Patient has never been a smoker. Pertinent PMH includes: CAD, NSTEMI, cerebellar stroke, cardiomegaly, angina, HTN, HLD, DOE, OSAH (does not utilize nocturnal PAP therapy), CKD-IV, GERD (on daily PPI), anemia, primary hyperparathyroidism (s/p parathyroidectomy), meningioma, OA, anxiety (on BZO).   Patient is followed by cardiology Tanda Rockers, MD). She was last seen in the cardiology clinic on 10/12/2022; notes reviewed. At the time of her clinic visit, patient doing well overall from a cardiovascular perspective. Patient with complaints of exertional dyspnea associated with climbing stairs.  Symptom attributed to weight and physical deconditioning. Patient denied any chest pain, PND, orthopnea, palpitations, significant peripheral edema, weakness, fatigue, vertiginous symptoms, or presyncope/syncope. Patient with a past medical history significant for cardiovascular diagnoses. Documented physical exam was grossly benign, providing no evidence of acute exacerbation and/or decompensation of the patient's known cardiovascular conditions.  Patient suffered an NSTEMI on 07/29/2016.  Troponins were trended: 2.54 --> 3.02 --> 3.87 --> 3.80 ng/L.  Diagnostic LEFT heart catheterization was performed on 08/01/2016 revealing normal coronary anatomy with no evidence of obstructive CAD.  CT imaging of the head performed on 07/29/2016 revealed an small old infarct at the posterior RIGHT cerebellum.  Patient with no residual deficits following neurological event.  Myocardial PET CT was performed on 02/28/2021 revealing a normal left ventricular systolic function with an EF of greater than 60%.  Sensitivity and specificity of the test reduced by patient's BMI.  There were no significant coronary carotid calcifications noted on the attenuation correction images.  There was a small, mildly severe, completely reversible perfusion defect involving the basal anterolateral and mid anterolateral segments.  Findings consistent with possible ischemia.  Study determined to be abnormal overall.  Patient underwent diagnostic LEFT heart catheterization on 04/04/2021 revealing normal left ventricular systolic function.  LVEDP elevated at 22 mmHg.  There were no findings of significant coronary artery disease.  Ectasia of the proximal LAD and LCx was noted.  Recommendations were for aggressive secondary prevention.  Blood pressure well controlled at 120/76 mmHg on currently prescribed CCB (amlodipine), diuretic (furosemide), vasodilator (hydralazine), ARB (losartan) and beta-blocker (metoprolol tartrate) therapies.  Patient  is on atorvastatin for her HLD diagnosis and ASCVD prevention. Patient is not diabetic. Patient does have an OSAH diagnosis, however does not utilize nocturnal PAP therapy. Functional capacity limited by obesity and her various medical comorbidities. With  that said, patient questionably able to achieve 4 METS of physical activity without, at least to some degree, experiencing angina/anginal equivalent symptoms. No changes were made to her medication regimen during her visit with cardiology. On efforts to further evaluate her complaints of exertional dyspnea, cardiology ordered a repeat TTE. Patient scheduled to follow-up with outpatient cardiology after non-invasive study to discuss results and ongoing care.   Patient was scheduled for TTE and follow up visit with cardiology on 10/19/2022, however due to issues with transportation, she could not make it to her scheduled appointments. Patient has been rescheduled for the echocardiogram and cardiology follow up in July.   [name] is scheduled for creation of a LEFT BRACHIOCEPHALIC ARTERIOVENOUS FISTULA on 10/20/2022 with Dr. Levora Dredge, MD.  Given patient's past medical history significant for cardiovascular diagnoses and other multiple medical comorbidities, presurgical clearances were sought from patient's cardiology office and primary care provider. Again, cardiology unable to sign off pending completion of ordered noninvasive studies. Clearance from medicine was obtained as follows:  Per internal medicine Jesse Sans, PA-C), "patient is optimized for surgery and may proceed with the planned surgical intervention at an overall LOW risk of significant perioperative complications".  In review of her medication reconciliation, it is noted that patient is currently on prescribed daily antithrombotic therapy.  Given her cardiovascular history, and per standing orders from Dr. Gilda Crease for this particular procedure, patient will continue her daily FULL  DOSE ASA throughout her perioperative course.  Patient denies previous perioperative complications with anesthesia in the past. In review of the available records, it is noted that patient underwent a general anesthetic course at [facility] ([facility]) in 06/2020 without documented complications.      10/10/2022    9:39 AM 10/10/2022    9:03 AM 06/19/2022    9:06 AM  Vitals with BMI  Height  5\' 4"  5\' 4"   Weight  353 lbs 3 oz 335 lbs 10 oz  BMI  60.59 57.58  Systolic 131  135  Diastolic 92  82  Pulse  77 48    Providers/Specialists:   NOTE: Primary physician provider listed below. Patient may have been seen by APP or partner within same practice.   PROVIDER ROLE / SPECIALTY LAST OV  Schnier, Latina Craver, MD Vascular Surgery (Surgeon) 06/19/2022  Shane Crutch, [place] Primary Care Provider 08/07/2022  Manuella Ghazi, MD Cardiology 10/12/2022  Dulcy Fanny, Jonetta Speak, MD Endocrinology 10/06/2022  Mady Haagensen, MD Nephrology 09/21/2022   Allergies:  Shrimp [shellfish allergy], Ace inhibitors, and Latex  Current Home Medications:   No current facility-administered medications for this encounter.    acetaminophen (TYLENOL) 500 MG tablet   amLODipine (NORVASC) 5 MG tablet   aspirin 325 MG tablet   atorvastatin (LIPITOR) 40 MG tablet   cyclobenzaprine (FLEXERIL) 10 MG tablet   diazepam (VALIUM) 10 MG tablet   docusate sodium (COLACE) 100 MG capsule   febuxostat (ULORIC) 40 MG tablet   furosemide (LASIX) 20 MG tablet   gabapentin (NEURONTIN) 300 MG capsule   hydrALAZINE (APRESOLINE) 100 MG tablet   losartan (COZAAR) 100 MG tablet   metoprolol tartrate (LOPRESSOR) 100 MG tablet   omeprazole (PRILOSEC) 20 MG capsule   traMADol (ULTRAM) 50 MG tablet   History:   Past Medical History:  Diagnosis Date   Anemia    Anginal pain (HCC)    Anxiety    a.) on BZO (diazepam) PRN   Arthritis    Cerebellar stroke (HCC)  a.) CT head 07/29/2016  --> small old infarct at posterior RIGHT cerebellum   Cholelithiasis    CKD (chronic kidney disease), stage IV (HCC)    Diastasis recti    Diastolic dysfunction 07/31/2016   a.) TTE 07/31/2016: EF 65%, mild LV dil, mod MR, G1DD   Diverticulosis    DOE (dyspnea on exertion)    Fibroid uterus    GERD (gastroesophageal reflux disease)    History of left heart catheterization 08/01/2016   a.) LHC 08/01/2016 --> normal coronaries; b.) LHC 04/04/2021: normal coronaries   Hypertension    Long term current use of aspirin    Meningioma (HCC) 07/29/2016   a.) CT head 07/29/2016:  11 x 12 x 11 mm at the high posterior RIGHT parietal region; b.) CT head 01/08/2017: stable at 12 mm   Mild cardiomegaly    NSTEMI (non-ST elevated myocardial infarction) (HCC) 07/29/2016   a.) NSTEMI 07/29/2016: troponins trended 2.54 --> 3.02 --> 3.87 --> 3.80 ng/L; b.) LHC 08/01/2016: normal coronaries   OSA (obstructive sleep apnea)    a.) does not utilize nocturnal PAP therapy   Primary hyperparathyroidism (HCC)    a.) s/p parathyroidectomy 06/07/2020; parathyroid adenoma identifed and resected   Umbilical hernia    Past Surgical History:  Procedure Laterality Date   CYSTOSCOPY Bilateral 11/06/2016   Procedure: CYSTOSCOPY with stent placement;  Surgeon: Christeen Douglas, MD;  Location: [facility] ORS;  Service: Gynecology;  Laterality: Bilateral;   HYSTERECTOMY ABDOMINAL WITH SALPINGECTOMY Left 11/06/2016   Procedure: HYSTERECTOMY ABDOMINAL WITH SALPINGECTOMY;  Surgeon: Christeen Douglas, MD;  Location: [facility] ORS;  Service: Gynecology;  Laterality: Left;   LEFT HEART CATH AND CORONARY ANGIOGRAPHY Right 08/01/2016   Procedure: Left Heart Cath and Coronary Angiography;  Surgeon: Laurier Nancy, MD;  Location: [facility] INVASIVE CV LAB;  Service: Cardiovascular;  Laterality: Right;   LEFT HEART CATH AND CORONARY ANGIOGRAPHY Left 04/04/2021   Procedure: LEFT HEART CATH AND CORONARY ANGIOGRAPHY; Location: [facility]; Surgeon: Eppie Gibson, MD   PARATHYROIDECTOMY N/A 06/07/2020   ROTATOR CUFF REPAIR Left    Family History  Problem Relation Age of Onset   CAD Father    Breast cancer Neg Hx    Social History   Tobacco Use   Smoking status: Never   Smokeless tobacco: Never  Substance Use Topics   Alcohol use: No   Drug use: No    Pertinent Clinical Results:  LABS:   No visits with results within 3 Day(s) from this visit.  Latest known visit with results is:  Hospital Outpatient Visit on 10/10/2022  Component Date Value Ref Range Status   WBC 10/10/2022 6.9  4.0 - 10.5 K/uL Final   RBC 10/10/2022 4.56  3.87 - 5.11 MIL/uL Final   Hemoglobin 10/10/2022 12.2  12.0 - 15.0 g/dL Final   HCT 14/78/2956 41.0  36.0 - 46.0 % Final   MCV 10/10/2022 89.9  80.0 - 100.0 fL Final   MCH 10/10/2022 26.8  26.0 - 34.0 pg Final   MCHC 10/10/2022 29.8 (L)  30.0 - 36.0 g/dL Final   RDW 21/30/8657 14.8  11.5 - 15.5 % Final   Platelets 10/10/2022 295  150 - 400 K/uL Final   nRBC 10/10/2022 0.0  0.0 - 0.2 % Final   Performed at [facility], [address] [place] [place], [place] [address]   ABO/RH(D) 10/10/2022 O POS   Final   Antibody Screen 10/10/2022 POS   Final   Sample Expiration 10/10/2022 10/24/2022,2359   Final  Extend sample reason 10/10/2022 NO TRANSFUSIONS OR PREGNANCY IN THE PAST 3 MONTHS   Final   Antibody Identification 10/10/2022 ANTI E   Final   PT AG Type 10/10/2022 POSITIVE FOR c ANTIGEN   Final   Unit Number 10/10/2022 [id number]   Final   Blood Component Type 10/10/2022 RED CELLS,LR   Final   Unit division 10/10/2022 00   Final   Status of Unit 10/10/2022 ALLOCATED   Final   Transfusion Status 10/10/2022 OK TO TRANSFUSE   Final   Crossmatch Result 10/10/2022 COMPATIBLE   Final   Unit Number 10/10/2022 [id number]   Final   Blood Component Type 10/10/2022 RED CELLS,LR   Final   Unit division 10/10/2022 00   Final   Status of Unit 10/10/2022 ALLOCATED   Final   Transfusion Status 10/10/2022 OK  TO TRANSFUSE   Final   Crossmatch Result 10/10/2022 COMPATIBLE   Final   MRSA, PCR 10/10/2022 NEGATIVE  NEGATIVE Final   Staphylococcus aureus 10/10/2022 NEGATIVE  NEGATIVE Final   Comment: (NOTE) The Xpert SA Assay (FDA approved for NASAL specimens in patients 59 years of age and older), is one component of a comprehensive surveillance program. It is not intended to diagnose infection nor to guide or monitor treatment. Performed at [facility], [address] [place], [place] [address]    Blood Product Unit Number 10/10/2022 [id number]   Final   PRODUCT CODE 10/10/2022 [id number]   Final   Unit Type and Rh 10/10/2022 5100   Final   Blood Product Expiration Date 10/10/2022 [id number]   Final   Blood Product Unit Number 10/10/2022 [id number]   Final   PRODUCT CODE 10/10/2022 [id number]   Final   Unit Type and Rh 10/10/2022 5100   Final   Blood Product Expiration Date 10/10/2022 [id number]   Final    Component Ref Range & Units 09/21/2022  Glucose 65 - 99 mg/dL 88  BUN 7 - 25 mg/dL 61 High   Creatinine 6.30 - 1.03 mg/dL 1.60 High   eGFR CKD-EPI CR 2021 > OR = 60 mL/min/1.71m2 17 Low   BUN/Creatinine Ratio 6 - 22 (calc) 20  Sodium 135 - 146 mmol/L 140  Potassium 3.5 - 5.3 mmol/L 4.9  Chloride 98 - 110 mmol/L 106  Bicarbonate (CO2) 20 - 32 mmol/L 25  Calcium 8.6 - 10.4 mg/dL 9.5  Phosphorus 2.5 - 4.5 mg/dL 4.1  Albumin 3.6 - 5.1 g/dL 3.6  Specimen Collected: 09/21/22 09:17   Performed by: Chancy Hurter Last Resulted: 09/22/22 13:37  Received From: [facility]  Result Received: 10/08/22 17:12    ECG: Date: 10/10/2022 Time ECG obtained: 1007 AM Rate: 70 bpm Rhythm:  Sinus rhythm with occasional PVCs Axis (leads I and aVF): Normal Intervals: PR 176 ms. QRS 96 ms. QTc 444 ms. ST segment and T wave changes: Nonspecific lateral T wave abnormalities  Comparison: Similar to previous tracing obtained on 03/28/2021   IMAGING / PROCEDURES: LEFT  HEART CATHETERIZATION AND CORONARY ANGIOGRAPHY performed on 04/04/2021 Normal left ventricular systolic function with an EF of greater than 55% Elevated LVEDP = 22 mmHg No angiographically significant coronary artery disease Ectasia of the proximal LAD and LCx Recommendations: aggressive secondary prevention  PET/CT MYOCARDIAL PERFUSION MULTIPLE performed on 02/28/2021 Abnormal myocardial perfusion study  There is a small, mildly severe, completely reversible perfusion defect involving the basal anterolateral and mid anterolateral segments. This is consistent with possible ischemia.  Left ventricular systolic function is normal. Post stress the ejection fraction  is > 60%.  Sensitivity and specificity of this test are reduced by the noted increased BMI  No significant coronary calcifications were noted on the attenuation CT   TRANSTHORACIC ECHOCARDIOGRAM performed on 07/31/2016 Normal left ventricular systolic function with an EF of 65% Mildly dilated left ventricular cavity size No regional wall motion abnormalities Left ventricular diastolic Doppler parameters consistent with abnormal relaxation (G1DD). Moderate mitral valve regurgitation Normal gradients; no valvular stenosis  Impression and Plan:  [name] has been referred for pre-anesthesia review and clearance prior to her undergoing the planned anesthetic and procedural courses. Available labs, pertinent testing, and imaging results were personally reviewed by me in preparation for upcoming operative/procedural course. [facility] medical record has been updated following extensive record review and patient interview with PAT staff.   This patient has been appropriately cleared by internal/family medicine (LOW) with the indicated risk of significant perioperative complications. Cardiology clearance pending based on repeat echocardiogram. Case discussed with attending anesthesiologist on call Karlton Lemon, MD). TTE was normal in  2018. Patient had a normal LHC in 12.2022; no CAD. Her only symptom is DOE, however that can effectively be attributed to her weight and physical deconditioning. She adamantly denies chest pain. Per Dr. Karlton Lemon, with medical clearance in place, patient should be acceptable risk to proceed with case as planned.   Based on clinical review performed today (10/19/22), barring any significant acute changes in the patient's overall condition, it is anticipated that she will be able to proceed with the planned surgical intervention. Any acute changes in clinical condition may necessitate her procedure being postponed and/or cancelled. Patient will meet with anesthesia team (MD and/or CRNA) on the day of her procedure for preoperative evaluation/assessment. Questions regarding anesthetic course will be fielded at that time.   Pre-surgical instructions were reviewed with the patient during her PAT appointment, and questions were fielded to satisfaction by PAT clinical staff. She has been instructed on which medications that she will need to hold prior to surgery, as well as the ones that have been deemed safe/appropriate to take on the day of her procedure. As part of the general education provided by PAT, patient made aware both verbally and in writing, that she would need to abstain from the use of any illegal substances during her perioperative course.  She was advised that failure to follow the provided instructions could necessitate case cancellation or result in serious perioperative complications up to and including death. Patient encouraged to contact PAT and/or her surgeon's office to discuss any questions or concerns that may arise prior to surgery; verbalized understanding.   Quentin Mulling, MSN, APRN, FNP-C, CEN [facility]  Peri-operative Services Nurse Practitioner Phone: [phone] Fax: [phone] 10/19/22 5:00 PM  NOTE: This note has been prepared using Dragon dictation  software. Despite my best ability to proofread, there is always the potential that unintentional transcriptional errors may still occur from this process.

## 2022-10-19 ENCOUNTER — Encounter: Payer: Self-pay | Admitting: Vascular Surgery

## 2022-10-19 LAB — TYPE AND SCREEN: PT AG Type: POSITIVE

## 2022-10-19 LAB — BPAM RBC
Blood Product Expiration Date: 202407182359
Unit Type and Rh: 5100

## 2022-10-19 MED ORDER — CHLORHEXIDINE GLUCONATE CLOTH 2 % EX PADS: 6.0000 | MEDICATED_PAD | Freq: Once | CUTANEOUS | Status: DC

## 2022-10-19 MED ORDER — ORAL CARE MOUTH RINSE: 15.0000 mL | Freq: Once | OROMUCOSAL | Status: DC

## 2022-10-19 MED ORDER — CEFAZOLIN SODIUM-DEXTROSE 2-4 GM/100ML-% IV SOLN: 2.0000 g | INTRAVENOUS | Status: DC

## 2022-10-19 MED ORDER — CHLORHEXIDINE GLUCONATE 0.12 % MT SOLN: 15.0000 mL | Freq: Once | OROMUCOSAL | Status: DC

## 2022-10-19 MED ORDER — SODIUM CHLORIDE 0.9 % IV SOLN: INTRAVENOUS | Status: DC

## 2022-10-20 ENCOUNTER — Encounter: Payer: Self-pay | Admitting: Vascular Surgery

## 2022-10-20 ENCOUNTER — Ambulatory Visit
Admission: RE | Admit: 2022-10-20 | Discharge: 2022-10-20 | Disposition: A | Discharge status: Home / Self Care | Payer: Medicaid Other | Attending: Vascular Surgery | Admitting: Vascular Surgery

## 2022-10-20 ENCOUNTER — Ambulatory Visit: Payer: Medicaid Other | Admitting: Urgent Care

## 2022-10-20 ENCOUNTER — Encounter
Admission: RE | Disposition: A | Discharge status: Home / Self Care | Payer: Self-pay | Source: Home / Self Care | Attending: Vascular Surgery

## 2022-10-20 ENCOUNTER — Other Ambulatory Visit: Payer: Self-pay

## 2022-10-20 DIAGNOSIS — I120 Hypertensive chronic kidney disease with stage 5 chronic kidney disease or end stage renal disease: Secondary | ICD-10-CM | POA: Diagnosis not present

## 2022-10-20 DIAGNOSIS — E782 Mixed hyperlipidemia: Secondary | ICD-10-CM | POA: Diagnosis not present

## 2022-10-20 DIAGNOSIS — N186 End stage renal disease: Secondary | ICD-10-CM | POA: Diagnosis not present

## 2022-10-20 DIAGNOSIS — F419 Anxiety disorder, unspecified: Secondary | ICD-10-CM | POA: Insufficient documentation

## 2022-10-20 DIAGNOSIS — Z79899 Other long term (current) drug therapy: Secondary | ICD-10-CM | POA: Insufficient documentation

## 2022-10-20 DIAGNOSIS — G4733 Obstructive sleep apnea (adult) (pediatric): Secondary | ICD-10-CM | POA: Insufficient documentation

## 2022-10-20 DIAGNOSIS — K219 Gastro-esophageal reflux disease without esophagitis: Secondary | ICD-10-CM | POA: Insufficient documentation

## 2022-10-20 DIAGNOSIS — I252 Old myocardial infarction: Secondary | ICD-10-CM | POA: Insufficient documentation

## 2022-10-20 DIAGNOSIS — Z01812 Encounter for preprocedural laboratory examination: Secondary | ICD-10-CM

## 2022-10-20 DIAGNOSIS — I2510 Atherosclerotic heart disease of native coronary artery without angina pectoris: Secondary | ICD-10-CM | POA: Diagnosis not present

## 2022-10-20 HISTORY — DX: Primary hyperparathyroidism: E21.0

## 2022-10-20 HISTORY — DX: Umbilical hernia without obstruction or gangrene: K42.9

## 2022-10-20 HISTORY — DX: Cardiomegaly: I51.7

## 2022-10-20 HISTORY — DX: Diverticulosis of intestine, part unspecified, without perforation or abscess without bleeding: K57.90

## 2022-10-20 HISTORY — DX: Other forms of dyspnea: R06.09

## 2022-10-20 HISTORY — DX: Chronic kidney disease, stage 4 (severe): N18.4

## 2022-10-20 HISTORY — DX: Long term (current) use of aspirin: Z79.82

## 2022-10-20 HISTORY — PX: AV FISTULA PLACEMENT: SHX1204

## 2022-10-20 HISTORY — DX: Leiomyoma of uterus, unspecified: D25.9

## 2022-10-20 HISTORY — DX: Separation of muscle (nontraumatic), other site: M62.08

## 2022-10-20 HISTORY — DX: Cerebral infarction, unspecified: I63.9

## 2022-10-20 HISTORY — DX: Calculus of gallbladder without cholecystitis without obstruction: K80.20

## 2022-10-20 HISTORY — DX: Obstructive sleep apnea (adult) (pediatric): G47.33

## 2022-10-20 HISTORY — DX: Chronic kidney disease, stage 5: N18.5

## 2022-10-20 LAB — POCT I-STAT, CHEM 8
BUN: 42 mg/dL — ABNORMAL HIGH (ref 6–20)
Calcium, Ion: 1.19 mmol/L (ref 1.15–1.40)
Chloride: 109 mmol/L (ref 98–111)
Creatinine, Ser: 2.6 mg/dL — ABNORMAL HIGH (ref 0.44–1.00)
Glucose, Bld: 84 mg/dL (ref 70–99)
HCT: 38 % (ref 36.0–46.0)
Hemoglobin: 12.9 g/dL (ref 12.0–15.0)
Potassium: 4.1 mmol/L (ref 3.5–5.1)
Sodium: 142 mmol/L (ref 135–145)
TCO2: 24 mmol/L (ref 22–32)

## 2022-10-20 SURGERY — ARTERIOVENOUS (AV) FISTULA CREATION
Anesthesia: General | Laterality: Left

## 2022-10-20 MED ORDER — HYDROCODONE-ACETAMINOPHEN 5-325 MG PO TABS: 1.0000 | ORAL_TABLET | Freq: Four times a day (QID) | ORAL | 0 refills | Status: DC | PRN

## 2022-10-20 MED ORDER — FENTANYL CITRATE (PF) 100 MCG/2ML IJ SOLN
INTRAMUSCULAR | Status: AC
  Filled 2022-10-20: qty 2

## 2022-10-20 MED ORDER — MIDAZOLAM HCL 2 MG/2ML IJ SOLN
INTRAMUSCULAR | Status: AC
  Filled 2022-10-20: qty 2

## 2022-10-20 MED ORDER — BUPIVACAINE LIPOSOME 1.3 % IJ SUSP
INTRAMUSCULAR | Status: AC
  Filled 2022-10-20: qty 20

## 2022-10-20 MED ORDER — ONDANSETRON HCL 4 MG/2ML IJ SOLN
INTRAMUSCULAR | Status: DC | PRN
  Administered 2022-10-20: 4 mg via INTRAVENOUS

## 2022-10-20 MED ORDER — DEXMEDETOMIDINE HCL IN NACL 80 MCG/20ML IV SOLN
INTRAVENOUS | Status: DC | PRN
  Administered 2022-10-20 (×2): 8 ug via INTRAVENOUS
  Administered 2022-10-20: 4 ug via INTRAVENOUS

## 2022-10-20 MED ORDER — HEPARIN SODIUM (PORCINE) 5000 UNIT/ML IJ SOLN
INTRAMUSCULAR | Status: AC
  Filled 2022-10-20: qty 1

## 2022-10-20 MED ORDER — CEFAZOLIN IN SODIUM CHLORIDE 3-0.9 GM/100ML-% IV SOLN
3.0000 g | Freq: Once | INTRAVENOUS | Status: DC
  Administered 2022-10-20: 3 g via INTRAVENOUS
  Filled 2022-10-20: qty 100

## 2022-10-20 MED ORDER — BUPIVACAINE HCL (PF) 0.5 % IJ SOLN
INTRAMUSCULAR | Status: AC
  Filled 2022-10-20: qty 30

## 2022-10-20 MED ORDER — MIDAZOLAM HCL 2 MG/2ML IJ SOLN
INTRAMUSCULAR | Status: DC | PRN
  Administered 2022-10-20: 2 mg via INTRAVENOUS

## 2022-10-20 MED ORDER — DEXAMETHASONE SODIUM PHOSPHATE 10 MG/ML IJ SOLN
INTRAMUSCULAR | Status: DC | PRN
  Administered 2022-10-20: 5 mg via INTRAVENOUS

## 2022-10-20 MED ORDER — CHLORHEXIDINE GLUCONATE 0.12 % MT SOLN
OROMUCOSAL | Status: AC
  Filled 2022-10-20: qty 15

## 2022-10-20 MED ORDER — CEFAZOLIN SODIUM-DEXTROSE 2-4 GM/100ML-% IV SOLN
INTRAVENOUS | Status: AC
  Filled 2022-10-20: qty 100

## 2022-10-20 MED ORDER — LIDOCAINE HCL (CARDIAC) PF 100 MG/5ML IV SOSY
PREFILLED_SYRINGE | INTRAVENOUS | Status: DC | PRN
  Administered 2022-10-20: 100 mg via INTRAVENOUS

## 2022-10-20 MED ORDER — CEFAZOLIN SODIUM-DEXTROSE 1-4 GM/50ML-% IV SOLN
INTRAVENOUS | Status: AC
  Filled 2022-10-20: qty 50

## 2022-10-20 MED ORDER — SODIUM CHLORIDE 0.9 % IV SOLN
INTRAVENOUS | Status: DC | PRN
  Administered 2022-10-20: 500 mL

## 2022-10-20 MED ORDER — MICROFIBRILLAR COLL HEMOSTAT EX PADS
MEDICATED_PAD | CUTANEOUS | Status: DC | PRN
  Administered 2022-10-20: 1 via TOPICAL

## 2022-10-20 MED ORDER — PHENYLEPHRINE HCL (PRESSORS) 10 MG/ML IV SOLN
INTRAVENOUS | Status: DC | PRN
  Administered 2022-10-20 (×2): 80 ug via INTRAVENOUS

## 2022-10-20 MED ORDER — SUGAMMADEX SODIUM 200 MG/2ML IV SOLN
INTRAVENOUS | Status: DC | PRN
  Administered 2022-10-20: 200 mg via INTRAVENOUS

## 2022-10-20 MED ORDER — ONDANSETRON HCL 4 MG/2ML IJ SOLN: 4.0000 mg | Freq: Four times a day (QID) | INTRAMUSCULAR | Status: DC | PRN

## 2022-10-20 MED ORDER — PROPOFOL 10 MG/ML IV BOLUS
INTRAVENOUS | Status: DC | PRN
  Administered 2022-10-20: 180 mg via INTRAVENOUS

## 2022-10-20 MED ORDER — BUPIVACAINE LIPOSOME 1.3 % IJ SUSP
INTRAMUSCULAR | Status: DC | PRN
  Administered 2022-10-20: 41 mL

## 2022-10-20 MED ORDER — FENTANYL CITRATE (PF) 100 MCG/2ML IJ SOLN
INTRAMUSCULAR | Status: DC | PRN
  Administered 2022-10-20: 50 ug via INTRAVENOUS

## 2022-10-20 MED ORDER — CEFAZOLIN SODIUM-DEXTROSE 2-4 GM/100ML-% IV SOLN: 2.0000 g | Freq: Once | INTRAVENOUS | Status: DC

## 2022-10-20 MED ORDER — HYDROMORPHONE HCL 1 MG/ML IJ SOLN: 1.0000 mg | Freq: Once | INTRAMUSCULAR | Status: DC | PRN

## 2022-10-20 MED ORDER — ACETAMINOPHEN 10 MG/ML IV SOLN
INTRAVENOUS | Status: AC
  Filled 2022-10-20: qty 100

## 2022-10-20 MED ORDER — ROCURONIUM BROMIDE 100 MG/10ML IV SOLN
INTRAVENOUS | Status: DC | PRN
  Administered 2022-10-20: 60 mg via INTRAVENOUS

## 2022-10-20 MED ORDER — CEFAZOLIN SODIUM-DEXTROSE 1-4 GM/50ML-% IV SOLN: 1.0000 g | Freq: Once | INTRAVENOUS | Status: DC

## 2022-10-20 MED ORDER — ACETAMINOPHEN 10 MG/ML IV SOLN
INTRAVENOUS | Status: DC | PRN
  Administered 2022-10-20: 1000 mg via INTRAVENOUS

## 2022-10-20 SURGICAL SUPPLY — 58 items
ADH SKN CLS APL DERMABOND .7 (GAUZE/BANDAGES/DRESSINGS) ×1
APL PRP STRL LF DISP 70% ISPRP (MISCELLANEOUS) ×1
APPLIER CLIP 11 MED OPEN (CLIP)
APPLIER CLIP 9.375 SM OPEN (CLIP)
APR CLP MED 11 20 MLT OPN (CLIP)
APR CLP SM 9.3 20 MLT OPN (CLIP)
BAG DECANTER FOR FLEXI CONT (MISCELLANEOUS) ×1 IMPLANT
BLADE SURG SZ11 CARB STEEL (BLADE) ×1 IMPLANT
BOOT SUTURE VASCULAR YLW (MISCELLANEOUS) ×1
BRUSH SCRUB EZ 4% CHG (MISCELLANEOUS) ×1 IMPLANT
CHLORAPREP W/TINT 26 (MISCELLANEOUS) ×1 IMPLANT
CLAMP SUTURE YELLOW 5 PAIRS (MISCELLANEOUS) ×1 IMPLANT
CLIP APPLIE 11 MED OPEN (CLIP) IMPLANT
CLIP APPLIE 9.375 SM OPEN (CLIP) IMPLANT
DERMABOND ADVANCED .7 DNX12 (GAUZE/BANDAGES/DRESSINGS) ×1 IMPLANT
DRESSING SURGICEL FIBRLLR 1X2 (HEMOSTASIS) ×1 IMPLANT
DRSG SURGICEL FIBRILLAR 1X2 (HEMOSTASIS) ×1
ELECT CAUTERY BLADE 6.4 (BLADE) ×1 IMPLANT
ELECT REM PT RETURN 9FT ADLT (ELECTROSURGICAL) ×1
ELECTRODE REM PT RTRN 9FT ADLT (ELECTROSURGICAL) ×1 IMPLANT
GEL ULTRASOUND 20GR AQUASONIC (MISCELLANEOUS) IMPLANT
GLOVE BIO SURGEON STRL SZ7 (GLOVE) ×1 IMPLANT
GLOVE SURG SYN 8.0 (GLOVE) ×1 IMPLANT
GLOVE SURG SYN 8.0 PF PI (GLOVE) ×1 IMPLANT
GOWN STRL REUS W/ TWL LRG LVL3 (GOWN DISPOSABLE) ×2 IMPLANT
GOWN STRL REUS W/ TWL XL LVL3 (GOWN DISPOSABLE) ×1 IMPLANT
GOWN STRL REUS W/TWL LRG LVL3 (GOWN DISPOSABLE) ×2
GOWN STRL REUS W/TWL XL LVL3 (GOWN DISPOSABLE) ×1
IV NS 500ML (IV SOLUTION) ×1
IV NS 500ML BAXH (IV SOLUTION) ×1 IMPLANT
KIT TURNOVER KIT A (KITS) ×1 IMPLANT
LABEL OR SOLS (LABEL) ×1 IMPLANT
LOOP VESSEL MAXI 1X406 RED (MISCELLANEOUS) ×1 IMPLANT
LOOP VESSEL MINI 0.8X406 BLUE (MISCELLANEOUS) ×2 IMPLANT
MANIFOLD NEPTUNE II (INSTRUMENTS) ×1 IMPLANT
NDL FILTER BLUNT 18X1 1/2 (NEEDLE) ×1 IMPLANT
NDL HYPO 30X.5 LL (NEEDLE) IMPLANT
NEEDLE FILTER BLUNT 18X1 1/2 (NEEDLE) ×1 IMPLANT
NEEDLE HYPO 30X.5 LL (NEEDLE) IMPLANT
PACK EXTREMITY ARMC (MISCELLANEOUS) ×1 IMPLANT
PAD PREP OB/GYN DISP 24X41 (PERSONAL CARE ITEMS) ×1 IMPLANT
STOCKINETTE 48X4 2 PLY STRL (GAUZE/BANDAGES/DRESSINGS) ×1 IMPLANT
STOCKINETTE STRL 4IN 9604848 (GAUZE/BANDAGES/DRESSINGS) ×1 IMPLANT
SUT MNCRL+ 5-0 UNDYED PC-3 (SUTURE) ×1 IMPLANT
SUT PROLENE 6 0 BV (SUTURE) ×4 IMPLANT
SUT SILK 2 0 (SUTURE) ×1
SUT SILK 2-0 18XBRD TIE 12 (SUTURE) ×1 IMPLANT
SUT SILK 3 0 (SUTURE) ×1
SUT SILK 3-0 18XBRD TIE 12 (SUTURE) ×1 IMPLANT
SUT SILK 4 0 (SUTURE) ×1
SUT SILK 4-0 18XBRD TIE 12 (SUTURE) ×1 IMPLANT
SUT VIC AB 3-0 SH 27 (SUTURE) ×1
SUT VIC AB 3-0 SH 27X BRD (SUTURE) ×1 IMPLANT
SYR 20ML LL LF (SYRINGE) ×1 IMPLANT
SYR 3ML LL SCALE MARK (SYRINGE) ×1 IMPLANT
TAG SUTURE CLAMP YLW 5PR (MISCELLANEOUS) ×1
TRAP FLUID SMOKE EVACUATOR (MISCELLANEOUS) ×1 IMPLANT
WATER STERILE IRR 500ML POUR (IV SOLUTION) ×1 IMPLANT

## 2022-10-20 NOTE — Transfer of Care (Signed)
Immediate Anesthesia Transfer of Care Note  Patient: [name]  Procedure(s) Performed: ARTERIOVENOUS (AV) FISTULA CREATION ( BRACHIALCEPHALIC) (Left)  Patient Location: PACU  Anesthesia Type:General  Level of Consciousness: awake  Airway & Oxygen Therapy: Patient Spontanous Breathing and Patient connected to face mask oxygen  Post-op Assessment: Report given to RN and Post -op Vital signs reviewed and stable  Post vital signs: Reviewed and stable  Last Vitals:  Vitals Value Taken Time  BP 96/65 10/20/22 1426  Temp    Pulse 85 10/20/22 1430  Resp 18 10/20/22 1430  SpO2 96 % 10/20/22 1430  Vitals shown include unvalidated device data.  Last Pain:  Vitals:   10/20/22 1039  PainSc: 0-No pain         Complications: No notable events documented.

## 2022-10-20 NOTE — Interval H&P Note (Signed)
History and Physical Interval Note:  10/20/2022 12:19 PM  [name]  has presented today for surgery, with the diagnosis of STAGE 4 CHRONIC KIDNEY DISEASE.  The various methods of treatment have been discussed with the patient and family. After consideration of risks, benefits and other options for treatment, the patient has consented to  Procedure(s): ARTERIOVENOUS (AV) FISTULA CREATION ( BRACHIALCEPHALIC) (Left) as a surgical intervention.  The patient's history has been reviewed, patient examined, no change in status, stable for surgery.  I have reviewed the patient's chart and labs.  Questions were answered to the patient's satisfaction.     Levora Dredge

## 2022-10-20 NOTE — Anesthesia Postprocedure Evaluation (Signed)
Anesthesia Post Note  Patient: [name]  Procedure(s) Performed: ARTERIOVENOUS (AV) FISTULA CREATION ( BRACHIALCEPHALIC) (Left)  Patient location during evaluation: PACU Anesthesia Type: General Level of consciousness: awake and alert Pain management: pain level controlled Vital Signs Assessment: post-procedure vital signs reviewed and stable Respiratory status: spontaneous breathing, nonlabored ventilation, respiratory function stable and patient connected to nasal cannula oxygen Cardiovascular status: blood pressure returned to baseline and stable Postop Assessment: no apparent nausea or vomiting Anesthetic complications: no   No notable events documented.   Last Vitals:  Vitals:   10/20/22 1430 10/20/22 1445  BP: 103/73 113/86  Pulse: 85 75  Resp: 18 17  Temp:  (!) 36.2 C  SpO2: 96% 95%    Last Pain:  Vitals:   10/20/22 1445  PainSc: 0-No pain                 Yevette Edwards

## 2022-10-20 NOTE — Anesthesia Preprocedure Evaluation (Signed)
Anesthesia Evaluation  Patient identified by MRN, date of birth, ID band Patient awake    Reviewed: Allergy & Precautions, H&P , NPO status , Patient's Chart, lab work & pertinent test results, reviewed documented beta blocker date and time   Airway Mallampati: II  TM Distance: >3 FB Neck ROM: full    Dental  (+) Teeth Intact   Pulmonary sleep apnea and Continuous Positive Airway Pressure Ventilation    Pulmonary exam normal        Cardiovascular Exercise Tolerance: Poor hypertension, On Medications + angina with exertion + CAD, + Past MI and + DOE  Normal cardiovascular exam Rate:Normal     Neuro/Psych   Anxiety      Neuromuscular disease CVA  negative psych ROS   GI/Hepatic Neg liver ROS,GERD  ,,  Endo/Other  negative endocrine ROS    Renal/GU Renal disease  negative genitourinary   Musculoskeletal   Abdominal   Peds  Hematology  (+) Blood dyscrasia, anemia   Anesthesia Other Findings   Reproductive/Obstetrics negative OB ROS                             Anesthesia Physical Anesthesia Plan  ASA: 4  Anesthesia Plan: General LMA   Post-op Pain Management:    Induction:   PONV Risk Score and Plan: 4 or greater  Airway Management Planned:   Additional Equipment:   Intra-op Plan:   Post-operative Plan:   Informed Consent: I have reviewed the patients History and Physical, chart, labs and discussed the procedure including the risks, benefits and alternatives for the proposed anesthesia with the patient or authorized representative who has indicated his/her understanding and acceptance.       Plan Discussed with: CRNA  Anesthesia Plan Comments:        Anesthesia Quick Evaluation

## 2022-10-20 NOTE — Progress Notes (Signed)
                    MRN : [id number]  [name] is a 51 y.o. (11/12/1970) female who presents with chief complaint of check access.  History of Present Illness:   The patient presents to [facility] for creation of dialysis access.   The patient has chronic renal insufficiency stage V secondary to hypertension. The patient's most recent creatinine clearance is 17 cc/min on 05/29/2022 (BUN/cr 55/3.28). The patient's volume status has not yet become an issue. Patient's blood pressures been relatively well controlled. There are mild uremic symptoms which appear to be relatively well tolerated at this time.   The patient notes the kidney problem has been present for a long time and has been progressively getting worse.   The patient is followed by nephrology.     The patient is right-handed.   The patient has been considering the various methods of dialysis and wishes to proceed with hemodialysis and therefore creation of AV access.   No recent shortening of the patient's walking distance or new symptoms consistent with claudication.  No history of rest pain symptoms. No new ulcers or wounds of the lower extremities have occurred.   The patient denies amaurosis fugax or recent TIA symptoms. There are no recent neurological changes noted. There is no history of DVT, PE or superficial thrombophlebitis. No recent episodes of angina or shortness of breath documented.   Vein mapping bilateral upper extremities demonstrates the cephalic vein at the wrist is too small for fistula creation bilaterally.  At the level of the antecubital fossa it does appear to be adequate for fistula creation.   Arterial duplex demonstrates triphasic signals throughout.  Current Meds  Medication Sig   acetaminophen (TYLENOL) 500 MG tablet Take 2 tablets (1,000 mg total) by mouth every 6 (six) hours.   amLODipine (NORVASC) 5 MG tablet Take 1 tablet (5 mg total) by mouth daily.   aspirin 325 MG tablet Take 1  tablet (325 mg total) by mouth daily. (Patient taking differently: Take 81 mg by mouth 2 (two) times daily.)   atorvastatin (LIPITOR) 40 MG tablet Take 1 tablet (40 mg total) by mouth daily at 6 PM.   docusate sodium (COLACE) 100 MG capsule Take 1 capsule (100 mg total) by mouth 2 (two) times daily.   febuxostat (ULORIC) 40 MG tablet Take 40 mg by mouth daily.   furosemide (LASIX) 20 MG tablet Take 20 mg by mouth daily.   gabapentin (NEURONTIN) 300 MG capsule TAKE 1 CAPSULE BY MOUTH THREE TIMES DAILY MAY CAUSE DROWSINESS   hydrALAZINE (APRESOLINE) 100 MG tablet Take 1 tablet (100 mg total) by mouth 3 (three) times daily. (Patient taking differently: Take 100 mg by mouth 2 (two) times daily.)   losartan (COZAAR) 100 MG tablet Take 100 mg by mouth daily.   metoprolol tartrate (LOPRESSOR) 100 MG tablet Take 1 tablet (100 mg total) by mouth 2 (two) times daily.   omeprazole (PRILOSEC) 20 MG capsule Take 20 mg by mouth daily.   traMADol (ULTRAM) 50 MG tablet Take 50 mg by mouth every 12 (twelve) hours as needed for moderate pain.    Past Medical History:  Diagnosis Date   Anemia    Anginal pain (HCC)    Anxiety    a.) on BZO (diazepam) PRN   Arthritis    Cerebellar stroke (HCC)    a.) CT head 07/29/2016 --> small old infarct at posterior RIGHT cerebellum     Cholelithiasis    CKD (chronic kidney disease), stage IV (HCC)    Diastasis recti    Diastolic dysfunction 07/31/2016   a.) TTE 07/31/2016: EF 65%, mild LV dil, mod MR, G1DD   Diverticulosis    DOE (dyspnea on exertion)    Fibroid uterus    GERD (gastroesophageal reflux disease)    History of left heart catheterization 08/01/2016   a.) LHC 08/01/2016 --> normal coronaries; b.) LHC 04/04/2021: normal coronaries   Hypertension    Long term current use of aspirin    Meningioma (HCC) 07/29/2016   a.) CT head 07/29/2016:  11 x 12 x 11 mm at the high posterior RIGHT parietal region; b.) CT head 01/08/2017: stable at 12 mm   Mild  cardiomegaly    NSTEMI (non-ST elevated myocardial infarction) (HCC) 07/29/2016   a.) NSTEMI 07/29/2016: troponins trended 2.54 --> 3.02 --> 3.87 --> 3.80 ng/L; b.) LHC 08/01/2016: normal coronaries   OSA (obstructive sleep apnea)    a.) does not utilize nocturnal PAP therapy   Primary hyperparathyroidism (HCC)    a.) s/p parathyroidectomy 06/07/2020; parathyroid adenoma identifed and resected   Umbilical hernia     Past Surgical History:  Procedure Laterality Date   CYSTOSCOPY Bilateral 11/06/2016   Procedure: CYSTOSCOPY with stent placement;  Surgeon: Beasley, Bethany, MD;  Location: [facility] ORS;  Service: Gynecology;  Laterality: Bilateral;   HYSTERECTOMY ABDOMINAL WITH SALPINGECTOMY Left 11/06/2016   Procedure: HYSTERECTOMY ABDOMINAL WITH SALPINGECTOMY;  Surgeon: Beasley, Bethany, MD;  Location: [facility] ORS;  Service: Gynecology;  Laterality: Left;   LEFT HEART CATH AND CORONARY ANGIOGRAPHY Right 08/01/2016   Procedure: Left Heart Cath and Coronary Angiography;  Surgeon: Shaukat A Khan, MD;  Location: [facility];  Service: Cardiovascular;  Laterality: Right;   LEFT HEART CATH AND CORONARY ANGIOGRAPHY Left 04/04/2021   Procedure: LEFT HEART CATH AND CORONARY ANGIOGRAPHY; Location: [facility]; Surgeon: Joseph Rossi, MD   PARATHYROIDECTOMY N/A 06/07/2020   ROTATOR CUFF REPAIR Left     Social History Social History   Tobacco Use   Smoking status: Never   Smokeless tobacco: Never  Substance Use Topics   Alcohol use: No   Drug use: No    Family History Family History  Problem Relation Age of Onset   CAD Father    Breast cancer Neg Hx     Allergies  Allergen Reactions   Shrimp [Shellfish Allergy] Anaphylaxis   Ace Inhibitors Cough   Latex Swelling     REVIEW OF SYSTEMS (Negative unless checked)  Constitutional: []Weight loss  []Fever  []Chills Cardiac: []Chest pain   []Chest pressure   []Palpitations   []Shortness of breath when laying flat   []Shortness of breath with  exertion. Vascular:  []Pain in legs with walking   []Pain in legs at rest  []History of DVT   []Phlebitis   []Swelling in legs   []Varicose veins   []Non-healing ulcers Pulmonary:   []Uses home oxygen   []Productive cough   []Hemoptysis   []Wheeze  []COPD   []Asthma Neurologic:  []Dizziness   []Seizures   []History of stroke   []History of TIA  []Aphasia   []Vissual changes   []Weakness or numbness in arm   []Weakness or numbness in leg Musculoskeletal:   []Joint swelling   []Joint pain   []Low back pain Hematologic:  []Easy bruising  []Easy bleeding   []Hypercoagulable state   []Anemic Gastrointestinal:  []Diarrhea   []Vomiting  []Gastroesophageal reflux/heartburn   []Difficulty swallowing. Genitourinary:  [x]Chronic   kidney disease   []Difficult urination  []Frequent urination   []Blood in urine Skin:  []Rashes   []Ulcers  Psychological:  []History of anxiety   [] History of major depression.  Physical Examination  Vitals:   10/20/22 1039  Weight: (!) 160.6 kg  Height: 5' 4" (1.626 m)   Body mass index is 60.76 kg/m. Gen: WD/WN, NAD Head: [place]/AT, No temporalis wasting.  Ear/Nose/Throat: Hearing grossly intact, nares w/o erythema or drainage Eyes: PER, EOMI, sclera nonicteric.  Neck: Supple, no gross masses or lesions.  No JVD.  Pulmonary:  Good air movement, no audible wheezing, no use of accessory muscles.  Cardiac: RRR, precordium non-hyperdynamic. Vascular:    Vessel Right Left  Radial Palpable Palpable  Brachial Palpable Palpable  Gastrointestinal: soft, non-distended. No guarding/no peritoneal signs.  Musculoskeletal: M/S 5/5 throughout.  No deformity.  Neurologic: CN 2-12 intact. Pain and light touch intact in extremities.  Symmetrical.  Speech is fluent. Motor exam as listed above. Psychiatric: Judgment intact, Mood & affect appropriate for pt's clinical situation. Dermatologic: No rashes or ulcers noted.  No changes consistent with cellulitis.   CBC Lab Results   Component Value Date   WBC 6.9 10/10/2022   HGB 12.9 10/20/2022   HCT 38.0 10/20/2022   MCV 89.9 10/10/2022   PLT 295 10/10/2022    BMET    Component Value Date/Time   NA 142 10/20/2022 1108   K 4.1 10/20/2022 1108   CL 109 10/20/2022 1108   CO2 23 03/21/2021 1041   GLUCOSE 84 10/20/2022 1108   BUN 42 (H) 10/20/2022 1108   CREATININE 2.60 (H) 10/20/2022 1108   CALCIUM 9.7 03/21/2021 1041   CALCIUM 9.4 12/21/2020 1145   GFRNONAA 23 (L) 03/21/2021 1041   GFRAA 37 (L) 07/04/2019 1109   Estimated Creatinine Clearance: 39.2 mL/min (A) (by C-G formula based on SCr of 2.6 mg/dL (H)).  COAG Lab Results  Component Value Date   INR 1.00 01/08/2017   INR 0.95 08/01/2016   INR 0.89 07/29/2016    Radiology No results found.   Assessment/Plan 1. Stage 4 chronic kidney disease (HCC) Recommend:   At this time the patient does not have appropriate extremity access for dialysis.   Patient is stage IV and therefore is not an urgent need of dialysis access.  Mapping today demonstrates good arterial flow.  She does have a cephalic vein at the level of the antecubital fossa which is adequate.  I have discussed with her that given the size of her arm this will be a two-stage procedure.  We will create the fistula monitor it for maturation in size and then at a point when she is closer to doing dialysis transpose it or superficial eyes it so that it can be readily accessed.   Patient should have a left brachiocephalic created.  As stage I of 2 surgeries.   The risks, benefits and alternative therapies were reviewed in detail with the patient.  All questions were answered.  The patient agrees to proceed with surgery.    The patient will follow up with me in the office after the surgery.   2. NSTEMI (non-ST elevated myocardial infarction) (HCC) Continue cardiac and antihypertensive medications as already ordered and reviewed, no changes at this time.  Patient relates she is followed by  Dr. Tim Nichols, [facility] cardiology [place].   Continue statin as ordered and reviewed, no changes at this time   Nitrates PRN for chest pain   3. Essential hypertension Continue   antihypertensive medications as already ordered, these medications have been reviewed and there are no changes at this time.   4. Gastroesophageal reflux disease without esophagitis Continue PPI as already ordered, this medication has been reviewed and there are no changes at this time.   Avoidence of caffeine and alcohol   Moderate elevation of the head of the bed    5. Mixed hyperlipidemia Continue statin as ordered and reviewed, no changes at this time   Marzell Isakson, MD  10/20/2022 12:17 PM   

## 2022-10-20 NOTE — Anesthesia Procedure Notes (Signed)
Procedure Name: Intubation Date/Time: 10/20/2022 12:34 PM  Performed by: Cheral Bay, CRNAPre-anesthesia Checklist: Patient identified, Emergency Drugs available, Suction available and Patient being monitored Patient Re-evaluated:Patient Re-evaluated prior to induction Oxygen Delivery Method: Circle system utilized Preoxygenation: Pre-oxygenation with 100% oxygen Induction Type: IV induction Ventilation: Mask ventilation without difficulty Laryngoscope Size: McGraph and 3 Grade View: Grade I Tube type: Oral Tube size: 7.5 mm Number of attempts: 1 Airway Equipment and Method: Stylet Placement Confirmation: ETT inserted through vocal cords under direct vision, positive ETCO2 and breath sounds checked- equal and bilateral Secured at: 20 cm Tube secured with: Tape Dental Injury: Teeth and Oropharynx as per pre-operative assessment

## 2022-10-20 NOTE — H&P (View-Only) (Signed)
MRN : [id number]  [name] is a 52 y.o. ([birth year]) female who presents with chief complaint of check access.  History of Present Illness:   The patient presents to [facility] for creation of dialysis access.   The patient has chronic renal insufficiency stage V secondary to hypertension. The patient's most recent creatinine clearance is 17 cc/min on 05/29/2022 (BUN/cr 55/3.28). The patient's volume status has not yet become an issue. Patient's blood pressures been relatively well controlled. There are mild uremic symptoms which appear to be relatively well tolerated at this time.   The patient notes the kidney problem has been present for a long time and has been progressively getting worse.   The patient is followed by nephrology.     The patient is right-handed.   The patient has been considering the various methods of dialysis and wishes to proceed with hemodialysis and therefore creation of AV access.   No recent shortening of the patient's walking distance or new symptoms consistent with claudication.  No history of rest pain symptoms. No new ulcers or wounds of the lower extremities have occurred.   The patient denies amaurosis fugax or recent TIA symptoms. There are no recent neurological changes noted. There is no history of DVT, PE or superficial thrombophlebitis. No recent episodes of angina or shortness of breath documented.   Vein mapping bilateral upper extremities demonstrates the cephalic vein at the wrist is too small for fistula creation bilaterally.  At the level of the antecubital fossa it does appear to be adequate for fistula creation.   Arterial duplex demonstrates triphasic signals throughout.  Current Meds  Medication Sig   acetaminophen (TYLENOL) 500 MG tablet Take 2 tablets (1,000 mg total) by mouth every 6 (six) hours.   amLODipine (NORVASC) 5 MG tablet Take 1 tablet (5 mg total) by mouth daily.   aspirin 325 MG tablet Take 1  tablet (325 mg total) by mouth daily. (Patient taking differently: Take 81 mg by mouth 2 (two) times daily.)   atorvastatin (LIPITOR) 40 MG tablet Take 1 tablet (40 mg total) by mouth daily at 6 PM.   docusate sodium (COLACE) 100 MG capsule Take 1 capsule (100 mg total) by mouth 2 (two) times daily.   febuxostat (ULORIC) 40 MG tablet Take 40 mg by mouth daily.   furosemide (LASIX) 20 MG tablet Take 20 mg by mouth daily.   gabapentin (NEURONTIN) 300 MG capsule TAKE 1 CAPSULE BY MOUTH THREE TIMES DAILY MAY CAUSE DROWSINESS   hydrALAZINE (APRESOLINE) 100 MG tablet Take 1 tablet (100 mg total) by mouth 3 (three) times daily. (Patient taking differently: Take 100 mg by mouth 2 (two) times daily.)   losartan (COZAAR) 100 MG tablet Take 100 mg by mouth daily.   metoprolol tartrate (LOPRESSOR) 100 MG tablet Take 1 tablet (100 mg total) by mouth 2 (two) times daily.   omeprazole (PRILOSEC) 20 MG capsule Take 20 mg by mouth daily.   traMADol (ULTRAM) 50 MG tablet Take 50 mg by mouth every 12 (twelve) hours as needed for moderate pain.    Past Medical History:  Diagnosis Date   Anemia    Anginal pain (HCC)    Anxiety    a.) on BZO (diazepam) PRN   Arthritis    Cerebellar stroke (HCC)    a.) CT head 07/29/2016 --> small old infarct at posterior RIGHT cerebellum  Cholelithiasis    CKD (chronic kidney disease), stage IV (HCC)    Diastasis recti    Diastolic dysfunction 07/31/2016   a.) TTE 07/31/2016: EF 65%, mild LV dil, mod MR, G1DD   Diverticulosis    DOE (dyspnea on exertion)    Fibroid uterus    GERD (gastroesophageal reflux disease)    History of left heart catheterization 08/01/2016   a.) LHC 08/01/2016 --> normal coronaries; b.) LHC 04/04/2021: normal coronaries   Hypertension    Long term current use of aspirin    Meningioma (HCC) 07/29/2016   a.) CT head 07/29/2016:  11 x 12 x 11 mm at the high posterior RIGHT parietal region; b.) CT head 01/08/2017: stable at 12 mm   Mild  cardiomegaly    NSTEMI (non-ST elevated myocardial infarction) (HCC) 07/29/2016   a.) NSTEMI 07/29/2016: troponins trended 2.54 --> 3.02 --> 3.87 --> 3.80 ng/L; b.) LHC 08/01/2016: normal coronaries   OSA (obstructive sleep apnea)    a.) does not utilize nocturnal PAP therapy   Primary hyperparathyroidism (HCC)    a.) s/p parathyroidectomy 06/07/2020; parathyroid adenoma identifed and resected   Umbilical hernia     Past Surgical History:  Procedure Laterality Date   CYSTOSCOPY Bilateral 11/06/2016   Procedure: CYSTOSCOPY with stent placement;  Surgeon: Christeen Douglas, MD;  Location: [facility] ORS;  Service: Gynecology;  Laterality: Bilateral;   HYSTERECTOMY ABDOMINAL WITH SALPINGECTOMY Left 11/06/2016   Procedure: HYSTERECTOMY ABDOMINAL WITH SALPINGECTOMY;  Surgeon: Christeen Douglas, MD;  Location: [facility] ORS;  Service: Gynecology;  Laterality: Left;   LEFT HEART CATH AND CORONARY ANGIOGRAPHY Right 08/01/2016   Procedure: Left Heart Cath and Coronary Angiography;  Surgeon: Laurier Nancy, MD;  Location: [facility];  Service: Cardiovascular;  Laterality: Right;   LEFT HEART CATH AND CORONARY ANGIOGRAPHY Left 04/04/2021   Procedure: LEFT HEART CATH AND CORONARY ANGIOGRAPHY; Location: [facility]; Surgeon: Eppie Gibson, MD   PARATHYROIDECTOMY N/A 06/07/2020   ROTATOR CUFF REPAIR Left     Social History Social History   Tobacco Use   Smoking status: Never   Smokeless tobacco: Never  Substance Use Topics   Alcohol use: No   Drug use: No    Family History Family History  Problem Relation Age of Onset   CAD Father    Breast cancer Neg Hx     Allergies  Allergen Reactions   Shrimp [Shellfish Allergy] Anaphylaxis   Ace Inhibitors Cough   Latex Swelling     REVIEW OF SYSTEMS (Negative unless checked)  Constitutional: [] Weight loss  [] Fever  [] Chills Cardiac: [] Chest pain   [] Chest pressure   [] Palpitations   [] Shortness of breath when laying flat   [] Shortness of breath with  exertion. Vascular:  [] Pain in legs with walking   [] Pain in legs at rest  [] History of DVT   [] Phlebitis   [] Swelling in legs   [] Varicose veins   [] Non-healing ulcers Pulmonary:   [] Uses home oxygen   [] Productive cough   [] Hemoptysis   [] Wheeze  [] COPD   [] Asthma Neurologic:  [] Dizziness   [] Seizures   [] History of stroke   [] History of TIA  [] Aphasia   [] Vissual changes   [] Weakness or numbness in arm   [] Weakness or numbness in leg Musculoskeletal:   [] Joint swelling   [] Joint pain   [] Low back pain Hematologic:  [] Easy bruising  [] Easy bleeding   [] Hypercoagulable state   [] Anemic Gastrointestinal:  [] Diarrhea   [] Vomiting  [] Gastroesophageal reflux/heartburn   [] Difficulty swallowing. Genitourinary:  [x] Chronic  kidney disease   [] Difficult urination  [] Frequent urination   [] Blood in urine Skin:  [] Rashes   [] Ulcers  Psychological:  [] History of anxiety   []  History of major depression.  Physical Examination  Vitals:   10/20/22 1039  Weight: (!) 160.6 kg  Height: 5\' 4"  (1.626 m)   Body mass index is 60.76 kg/m. Gen: WD/WN, NAD Head: [place]/AT, No temporalis wasting.  Ear/Nose/Throat: Hearing grossly intact, nares w/o erythema or drainage Eyes: PER, EOMI, sclera nonicteric.  Neck: Supple, no gross masses or lesions.  No JVD.  Pulmonary:  Good air movement, no audible wheezing, no use of accessory muscles.  Cardiac: RRR, precordium non-hyperdynamic. Vascular:    Vessel Right Left  Radial Palpable Palpable  Brachial Palpable Palpable  Gastrointestinal: soft, non-distended. No guarding/no peritoneal signs.  Musculoskeletal: M/S 5/5 throughout.  No deformity.  Neurologic: CN 2-12 intact. Pain and light touch intact in extremities.  Symmetrical.  Speech is fluent. Motor exam as listed above. Psychiatric: Judgment intact, Mood & affect appropriate for pt's clinical situation. Dermatologic: No rashes or ulcers noted.  No changes consistent with cellulitis.   CBC Lab Results   Component Value Date   WBC 6.9 10/10/2022   HGB 12.9 10/20/2022   HCT 38.0 10/20/2022   MCV 89.9 10/10/2022   PLT 295 10/10/2022    BMET    Component Value Date/Time   NA 142 10/20/2022 1108   K 4.1 10/20/2022 1108   CL 109 10/20/2022 1108   CO2 23 03/21/2021 1041   GLUCOSE 84 10/20/2022 1108   BUN 42 (H) 10/20/2022 1108   CREATININE 2.60 (H) 10/20/2022 1108   CALCIUM 9.7 03/21/2021 1041   CALCIUM 9.4 12/21/2020 1145   GFRNONAA 23 (L) 03/21/2021 1041   GFRAA 37 (L) 07/04/2019 1109   Estimated Creatinine Clearance: 39.2 mL/min (A) (by C-G formula based on SCr of 2.6 mg/dL (H)).  COAG Lab Results  Component Value Date   INR 1.00 01/08/2017   INR 0.95 08/01/2016   INR 0.89 07/29/2016    Radiology No results found.   Assessment/Plan 1. Stage 4 chronic kidney disease (HCC) Recommend:   At this time the patient does not have appropriate extremity access for dialysis.   Patient is stage IV and therefore is not an urgent need of dialysis access.  Mapping today demonstrates good arterial flow.  She does have a cephalic vein at the level of the antecubital fossa which is adequate.  I have discussed with her that given the size of her arm this will be a two-stage procedure.  We will create the fistula monitor it for maturation in size and then at a point when she is closer to doing dialysis transpose it or superficial eyes it so that it can be readily accessed.   Patient should have a left brachiocephalic created.  As stage I of 2 surgeries.   The risks, benefits and alternative therapies were reviewed in detail with the patient.  All questions were answered.  The patient agrees to proceed with surgery.    The patient will follow up with me in the office after the surgery.   2. NSTEMI (non-ST elevated myocardial infarction) (HCC) Continue cardiac and antihypertensive medications as already ordered and reviewed, no changes at this time.  Patient relates she is followed by  Dr. Doy Hutching, [facility] cardiology [place].   Continue statin as ordered and reviewed, no changes at this time   Nitrates PRN for chest pain   3. Essential hypertension Continue  antihypertensive medications as already ordered, these medications have been reviewed and there are no changes at this time.   4. Gastroesophageal reflux disease without esophagitis Continue PPI as already ordered, this medication has been reviewed and there are no changes at this time.   Avoidence of caffeine and alcohol   Moderate elevation of the head of the bed    5. Mixed hyperlipidemia Continue statin as ordered and reviewed, no changes at this time   Levora Dredge, MD  10/20/2022 12:17 PM

## 2022-10-20 NOTE — Discharge Instructions (Addendum)
AMBULATORY SURGERY  ?DISCHARGE INSTRUCTIONS ? ? ?The drugs that you were given will stay in your system until tomorrow so for the next 24 hours you should not: ? ?Drive an automobile ?Make any legal decisions ?Drink any alcoholic beverage ? ? ?You may resume regular meals tomorrow.  Today it is better to start with liquids and gradually work up to solid foods. ? ?You may eat anything you prefer, but it is better to start with liquids, then soup and crackers, and gradually work up to solid foods. ? ? ?Please notify your doctor immediately if you have any unusual bleeding, trouble breathing, redness and pain at the surgery site, drainage, fever, or pain not relieved by medication. ? ? ? ?Additional Instructions: ? ? ? ?Please contact your physician with any problems or Same Day Surgery at [phone], Monday through Friday 6 am to 4 pm, or [facility] at [place] Main number at [phone].  ?

## 2022-10-20 NOTE — Op Note (Signed)
OPERATIVE NOTE   PROCEDURE: left brachial cephalic arteriovenous fistula placement  PRE-OPERATIVE DIAGNOSIS: End Stage Renal Disease  POST-OPERATIVE DIAGNOSIS: End Stage Renal Disease  SURGEON: Earl Lites Satin Boal  ASSISTANT(S): Rolla Plate, NP  ANESTHESIA: general  ESTIMATED BLOOD LOSS: <50 cc  FINDING(S): 5 to 6 mm vein  SPECIMEN(S):  none  INDICATIONS:   [name] is a 52 y.o. female who presents with end stage renal disease.  The patient is scheduled for left brachiocephalic arteriovenous fistula placement.  The patient is aware the risks include but are not limited to: bleeding, infection, steal syndrome, nerve damage, ischemic monomelic neuropathy, failure to mature, and need for additional procedures.  The patient is aware of the risks of the procedure and elects to proceed forward.  DESCRIPTION: After full informed written consent was obtained from the patient, the patient was brought back to the operating room and placed supine upon the operating table.  Prior to induction, the patient received IV antibiotics.   After obtaining adequate anesthesia, the patient was then prepped and draped in the standard fashion for a left arm access procedure.   A first assistant was required to provide a safe and appropriate environment for executing the surgery.  The assistant was integral in providing retraction, exposure, running suture providing suction and in the closing process.   A curvilinear incision was then created midway between the radial impulse and the cephalic vein. The cephalic vein was then identified and dissected circumferentially. It was marked with a surgical marker.    Attention was then turned to the brachial artery which was exposed through the same incision and looped proximally and distally. Side branches were controlled with 4-0 silk ties.  The distal segment of the vein was ligated with a  2-0 silk, and the vein was transected.  The proximal segment  was interrogated with serial dilators.  The vein accepted up to a 5 mm dilator without any difficulty (this was the largest [employer] coronary dilator on the field). Heparinized saline was infused into the vein and clamped it with a small bulldog.  At this point, I reset my exposure of the brachial artery and controlled the artery with vessel loops proximally and distally.  An arteriotomy was then made with a #11 blade, and extended with a Potts scissor.  Heparinized saline was injected proximal and distal into the radial artery.  The vein was then approximated to the artery while the artery was in its native bed and subsequently the vein was beveled using Potts scissors. The vein was then sewn to the artery in an end-to-side configuration with a running stitch of 6-0 Prolene.  Prior to completing this anastomosis Flushing maneuvers were performed and the artery was allowed to forward and back bleed.  There was no evidence of clot from any vessels.  I completed the anastomosis in the usual fashion and then released all vessel loops and clamps.    There was good  thrill in the venous outflow, and there was 1+ palpable radial pulse.  At this point, I irrigated out the surgical wound.  There was no further active bleeding.  The subcutaneous tissue was reapproximated with a running stitch of 3-0 Vicryl.  The skin was then reapproximated with a running subcuticular stitch of 4-0 Vicryl.  The skin was then cleaned, dried, and reinforced with Dermabond.    The patient tolerated this procedure well.   COMPLICATIONS: None  CONDITION: Good   Levora Dredge [facility]  Office: [phone]   10/20/2022, 2:59 PM

## 2022-10-22 LAB — BPAM RBC
Blood Product Expiration Date: 202407182359
Unit Type and Rh: 5100

## 2022-10-22 LAB — TYPE AND SCREEN
ABO/RH(D): O POS
Antibody Screen: POSITIVE
Unit division: 0
Unit division: 0

## 2022-11-01 ENCOUNTER — Ambulatory Visit (INDEPENDENT_AMBULATORY_CARE_PROVIDER_SITE_OTHER): Payer: Medicaid Other | Admitting: Nurse Practitioner

## 2022-11-01 ENCOUNTER — Encounter (INDEPENDENT_AMBULATORY_CARE_PROVIDER_SITE_OTHER): Payer: Self-pay

## 2022-11-01 DIAGNOSIS — N184 Chronic kidney disease, stage 4 (severe): Secondary | ICD-10-CM

## 2022-11-01 NOTE — Progress Notes (Signed)
Patient came in for left arm fistula wound check. The wound was covered with steri-strips, silvadene, and gauze.  Patient will keep follow appointment as schedule.

## 2022-11-06 ENCOUNTER — Encounter (INDEPENDENT_AMBULATORY_CARE_PROVIDER_SITE_OTHER): Payer: Self-pay | Admitting: Nurse Practitioner

## 2022-11-07 ENCOUNTER — Encounter: Payer: Self-pay | Admitting: Vascular Surgery

## 2022-11-08 ENCOUNTER — Other Ambulatory Visit (INDEPENDENT_AMBULATORY_CARE_PROVIDER_SITE_OTHER): Payer: Self-pay | Admitting: Vascular Surgery

## 2022-11-08 DIAGNOSIS — N186 End stage renal disease: Secondary | ICD-10-CM

## 2022-11-08 DIAGNOSIS — Z9889 Other specified postprocedural states: Secondary | ICD-10-CM

## 2022-11-17 ENCOUNTER — Encounter (INDEPENDENT_AMBULATORY_CARE_PROVIDER_SITE_OTHER): Payer: Self-pay | Admitting: Nurse Practitioner

## 2022-11-17 ENCOUNTER — Ambulatory Visit (INDEPENDENT_AMBULATORY_CARE_PROVIDER_SITE_OTHER): Payer: Medicaid Other

## 2022-11-17 ENCOUNTER — Ambulatory Visit (INDEPENDENT_AMBULATORY_CARE_PROVIDER_SITE_OTHER): Payer: Medicaid Other | Admitting: Nurse Practitioner

## 2022-11-17 VITALS — BP 146/83 | HR 84 | Resp 16 | Wt 351.0 lb

## 2022-11-17 DIAGNOSIS — Z9889 Other specified postprocedural states: Secondary | ICD-10-CM | POA: Diagnosis not present

## 2022-11-17 DIAGNOSIS — N186 End stage renal disease: Secondary | ICD-10-CM

## 2022-11-17 DIAGNOSIS — N184 Chronic kidney disease, stage 4 (severe): Secondary | ICD-10-CM

## 2022-11-18 ENCOUNTER — Encounter (INDEPENDENT_AMBULATORY_CARE_PROVIDER_SITE_OTHER): Payer: Self-pay | Admitting: Nurse Practitioner

## 2022-11-18 NOTE — Progress Notes (Signed)
Subjective:    Patient ID: [name], female    DOB: Aug 20, 1970, 52 y.o.   MRN: [id number] Chief Complaint  Patient presents with   Follow-up    [facility] 3 week with HDA    The patient is a 52 year old female who presents today for follow-up evaluation of her left brachiocephalic AV fistula.  This access was placed on 10/20/2022.  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  Following placement she developed a small area of dehiscence.  The wound is not painful does not have any foul-smelling odor or drainage.    Review of Systems  Skin:  Positive for wound.  All other systems reviewed and are negative.      Objective:   Physical Exam Vitals reviewed.  HENT:     Head: Normocephalic.  Cardiovascular:     Rate and Rhythm: Normal rate.     Pulses:          Radial pulses are 2+ on the left side.     Arteriovenous access: Left arteriovenous access is present.    Comments: Good thrill and bruit Pulmonary:     Effort: Pulmonary effort is normal.  Skin:    General: Skin is warm and dry.  Neurological:     Mental Status: She is alert and oriented to person, place, and time.  Psychiatric:        Mood and Affect: Mood normal.        Behavior: Behavior normal.        Thought Content: Thought content normal.        Judgment: Judgment normal.       BP (!) 146/83 (BP Location: Right Arm)   Pulse 84   Resp 16   Wt (!) 351 lb (159.2 kg)   LMP  (LMP Unknown)   BMI 60.25 kg/m   Past Medical History:  Diagnosis Date   Anemia    Anginal pain (HCC)    Anxiety    a.) on BZO (diazepam) PRN   Arthritis    Cerebellar stroke (HCC)    a.) CT head 07/29/2016 --> small old infarct at posterior RIGHT cerebellum   Cholelithiasis    CKD (chronic kidney disease), stage IV (HCC)    Diastasis recti    Diastolic dysfunction 07/31/2016   a.) TTE 07/31/2016: EF 65%, mild LV dil, mod MR, G1DD   Diverticulosis    DOE (dyspnea on exertion)    Fibroid uterus     GERD (gastroesophageal reflux disease)    History of left heart catheterization 08/01/2016   a.) LHC 08/01/2016 --> normal coronaries; b.) LHC 04/04/2021: normal coronaries   Hypertension    Long term current use of aspirin    Meningioma (HCC) 07/29/2016   a.) CT head 07/29/2016:  11 x 12 x 11 mm at the high posterior RIGHT parietal region; b.) CT head 01/08/2017: stable at 12 mm   Mild cardiomegaly    NSTEMI (non-ST elevated myocardial infarction) (HCC) 07/29/2016   a.) NSTEMI 07/29/2016: troponins trended 2.54 --> 3.02 --> 3.87 --> 3.80 ng/L; b.) LHC 08/01/2016: normal coronaries   OSA (obstructive sleep apnea)    a.) does not utilize nocturnal PAP therapy   Primary hyperparathyroidism (HCC)    a.) s/p parathyroidectomy 06/07/2020; parathyroid adenoma identifed and resected   Umbilical hernia     Social History   Socioeconomic History   Marital status: Significant Other    Spouse name: Chrissie Noa   Number of children:  Not on file   Years of education: Not on file   Highest education level: Not on file  Occupational History   Not on file  Tobacco Use   Smoking status: Never   Smokeless tobacco: Never  Substance and Sexual Activity   Alcohol use: No   Drug use: No   Sexual activity: Not on file  Other Topics Concern   Not on file  Social History Narrative   Not on file   Social Determinants of Health   Financial Resource Strain: Low Risk  (11/01/2022)   [facility]   Overall Financial Resource Strain (CARDIA)    Difficulty of Paying Living Expenses: Not very hard  Food Insecurity: No Food Insecurity (11/01/2022)   [facility]   Hunger Vital Sign    Worried About Running Out of Food in the Last Year: Never true    Ran Out of Food in the Last Year: Never true  Transportation Needs: No Transportation Needs (11/01/2022)   Received from [facility], [facility]   [facility] -  Transportation    Lack of Transportation (Medical): No    Lack of Transportation (Non-Medical): No  Physical Activity: Inactive (06/09/2021)   Received from [facility], [facility]   Exercise Vital Sign    Days of Exercise per Week: 0 days    Minutes of Exercise per Session: 0 min  Stress: No Stress Concern Present (06/09/2021)   Received from [facility], [facility] - Occupational Stress Questionnaire    Feeling of Stress : Not at all  Social Connections: Moderately Isolated (06/09/2021)   Received from [facility], [facility]   Social Connection and Isolation Panel [NHANES]    Frequency of Communication with Friends and Family: More than three times a week    Frequency of Social Gatherings with Friends and Family: More than three times a week    Attends Religious Services: 1 to 4 times per year    Active Member of [employer] or Organizations: No    Attends Banker Meetings: Never    Marital Status: Never married  Intimate Partner Violence: Not At Risk (06/09/2021)   Received from [facility], [facility]   Humiliation, Afraid, Rape, and Kick questionnaire    Fear of Current or Ex-Partner: No    Emotionally Abused: No    Physically Abused: No    Sexually Abused: No    Past Surgical History:  Procedure Laterality Date   AV FISTULA PLACEMENT Left 10/20/2022   Procedure: ARTERIOVENOUS (AV) FISTULA CREATION ( BRACHIALCEPHALIC);  Surgeon: Renford Dills, MD;  Location: [facility] ORS;  Service: Vascular;  Laterality: Left;   CYSTOSCOPY Bilateral 11/06/2016   Procedure: CYSTOSCOPY with stent placement;  Surgeon: Christeen Douglas, MD;  Location: [facility] ORS;  Service: Gynecology;  Laterality: Bilateral;   HYSTERECTOMY ABDOMINAL WITH SALPINGECTOMY Left 11/06/2016   Procedure: HYSTERECTOMY ABDOMINAL WITH SALPINGECTOMY;  Surgeon: Christeen Douglas, MD;  Location: [facility];  Service: Gynecology;  Laterality: Left;    LEFT HEART CATH AND CORONARY ANGIOGRAPHY Right 08/01/2016   Procedure: Left Heart Cath and Coronary Angiography;  Surgeon: Laurier Nancy, MD;  Location: [facility] INVASIVE CV LAB;  Service: Cardiovascular;  Laterality: Right;   LEFT HEART CATH AND CORONARY ANGIOGRAPHY Left 04/04/2021   Procedure: LEFT HEART CATH AND CORONARY ANGIOGRAPHY; Location: [facility]; Surgeon: Eppie Gibson, MD   PARATHYROIDECTOMY N/A 06/07/2020  ROTATOR CUFF REPAIR Left     Family History  Problem Relation Age of Onset   CAD Father    Breast cancer Neg Hx     Allergies  Allergen Reactions   Hydrochlorothiazide Cough   Latex Swelling and Rash    Other reaction(s): Unknown   Shrimp [Shellfish Allergy] Anaphylaxis   Ace Inhibitors Cough       Latest Ref Rng & Units 10/20/2022   11:08 AM 10/10/2022    9:51 AM 03/21/2021   10:41 AM  CBC  WBC 4.0 - 10.5 K/uL  6.9  5.5   Hemoglobin 12.0 - 15.0 g/dL 59.5  63.8  75.6   Hematocrit 36.0 - 46.0 % 38.0  41.0  42.1   Platelets 150 - 400 K/uL  295  286       CMP     Component Value Date/Time   NA 142 10/20/2022 1108   K 4.1 10/20/2022 1108   CL 109 10/20/2022 1108   CO2 23 03/21/2021 1041   GLUCOSE 84 10/20/2022 1108   BUN 42 (H) 10/20/2022 1108   CREATININE 2.60 (H) 10/20/2022 1108   CALCIUM 9.7 03/21/2021 1041   CALCIUM 9.4 12/21/2020 1145   PROT 7.6 07/24/2020 0808   ALBUMIN 3.5 03/21/2021 1041   AST 18 07/24/2020 0808   ALT 18 07/24/2020 0808   ALKPHOS 114 07/24/2020 0808   BILITOT 0.7 07/24/2020 0808   GFRNONAA 23 (L) 03/21/2021 1041     No results found.     Assessment & Plan:   1. Stage 4 chronic kidney disease (HCC) Today the patient has adequate flow volume however she still has a small wound.  Will have her placed Medihoney and change daily.  Will have her return for evaluation in 3 weeks.  The patient has not yet started dialysis there is still some time yet.  I do suspect that the patient will also need superficialization of her access prior  to use.  However we can evaluate this post healing her initial wound.   Current Outpatient Medications on File Prior to Visit  Medication Sig Dispense Refill   acetaminophen (TYLENOL) 500 MG tablet Take 2 tablets (1,000 mg total) by mouth every 6 (six) hours. 30 tablet 0   amLODipine (NORVASC) 5 MG tablet Take 1 tablet (5 mg total) by mouth daily. 60 tablet 3   aspirin 325 MG tablet Take 1 tablet (325 mg total) by mouth daily. (Patient taking differently: Take 81 mg by mouth 2 (two) times daily.) 30 tablet 2   atorvastatin (LIPITOR) 40 MG tablet Take 1 tablet (40 mg total) by mouth daily at 6 PM. 30 tablet 2   docusate sodium (COLACE) 100 MG capsule Take 1 capsule (100 mg total) by mouth 2 (two) times daily. 60 capsule 0   febuxostat (ULORIC) 40 MG tablet Take 40 mg by mouth daily.     furosemide (LASIX) 20 MG tablet Take 20 mg by mouth daily.     gabapentin (NEURONTIN) 300 MG capsule TAKE 1 CAPSULE BY MOUTH THREE TIMES DAILY MAY CAUSE DROWSINESS     hydrALAZINE (APRESOLINE) 100 MG tablet Take 1 tablet (100 mg total) by mouth 3 (three) times daily. (Patient taking differently: Take 100 mg by mouth 2 (two) times daily.) 90 tablet 4   HYDROcodone-acetaminophen (NORCO) 5-325 MG tablet Take 1 tablet by mouth every 6 (six) hours as needed for moderate pain. 40 tablet 0   losartan (COZAAR) 100 MG tablet Take 100 mg by mouth daily.  metoprolol tartrate (LOPRESSOR) 100 MG tablet Take 1 tablet (100 mg total) by mouth 2 (two) times daily. 60 tablet 4   omeprazole (PRILOSEC) 20 MG capsule Take 20 mg by mouth daily.     traMADol (ULTRAM) 50 MG tablet Take 50 mg by mouth every 12 (twelve) hours as needed for moderate pain.     cyclobenzaprine (FLEXERIL) 10 MG tablet TAKE 1 TABLET BY MOUTH AS NEEDED AT BEDTIME (Patient not taking: Reported on 10/20/2022)     diazepam (VALIUM) 10 MG tablet Take 10 mg by mouth as needed for anxiety. May take with a procedure (Patient not taking: Reported on 10/20/2022)     No  current facility-administered medications on file prior to visit.    There are no Patient Instructions on file for this visit. No follow-ups on file.   Georgiana Spinner, NP

## 2022-11-22 ENCOUNTER — Ambulatory Visit (INDEPENDENT_AMBULATORY_CARE_PROVIDER_SITE_OTHER): Payer: Medicaid Other | Admitting: Nurse Practitioner

## 2022-11-22 ENCOUNTER — Encounter (INDEPENDENT_AMBULATORY_CARE_PROVIDER_SITE_OTHER): Payer: Medicaid Other

## 2022-12-04 ENCOUNTER — Ambulatory Visit (INDEPENDENT_AMBULATORY_CARE_PROVIDER_SITE_OTHER): Payer: Medicaid Other | Admitting: Nurse Practitioner

## 2022-12-04 ENCOUNTER — Encounter (INDEPENDENT_AMBULATORY_CARE_PROVIDER_SITE_OTHER): Payer: Self-pay | Admitting: Nurse Practitioner

## 2022-12-04 VITALS — BP 115/86 | HR 84 | Resp 18 | Ht 64.0 in | Wt 353.4 lb

## 2022-12-04 DIAGNOSIS — N184 Chronic kidney disease, stage 4 (severe): Secondary | ICD-10-CM

## 2022-12-04 NOTE — Progress Notes (Signed)
Subjective:    Patient ID: [name], female    DOB: 1970/12/27, 52 y.o.   MRN: [id number] Chief Complaint  Patient presents with   Follow-up    2 weeks no studies    The patient is a 52 year old female who presents today for follow-up evaluation of her left brachiocephalic AV fistula.  This access was placed on 10/20/2022.  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  Following placement she developed a small area of dehiscence.  The wound is not painful does not have any foul-smelling odor or drainage.  She has been using Medihoney in the area.  It has helped very well.  There is still some discoloration but I believe that very nearly healed at this point.    Review of Systems  Skin:  Positive for wound.  All other systems reviewed and are negative.      Objective:   Physical Exam Vitals reviewed.  HENT:     Head: Normocephalic.  Cardiovascular:     Rate and Rhythm: Normal rate.     Pulses:          Radial pulses are 2+ on the left side.     Arteriovenous access: Left arteriovenous access is present.    Comments: Good thrill and bruit Pulmonary:     Effort: Pulmonary effort is normal.  Skin:    General: Skin is warm and dry.  Neurological:     Mental Status: She is alert and oriented to person, place, and time.  Psychiatric:        Mood and Affect: Mood normal.        Behavior: Behavior normal.        Thought Content: Thought content normal.        Judgment: Judgment normal.       BP 115/86 (BP Location: Right Wrist)   Pulse 84   Resp 18   Ht 5\' 4"  (1.626 m)   Wt (!) 353 lb 6.4 oz (160.3 kg)   LMP  (LMP Unknown)   BMI 60.66 kg/m   Past Medical History:  Diagnosis Date   Anemia    Anginal pain (HCC)    Anxiety    a.) on BZO (diazepam) PRN   Arthritis    Cerebellar stroke (HCC)    a.) CT head 07/29/2016 --> small old infarct at posterior RIGHT cerebellum   Cholelithiasis    CKD (chronic kidney disease), stage IV  (HCC)    Diastasis recti    Diastolic dysfunction 07/31/2016   a.) TTE 07/31/2016: EF 65%, mild LV dil, mod MR, G1DD   Diverticulosis    DOE (dyspnea on exertion)    Fibroid uterus    GERD (gastroesophageal reflux disease)    History of left heart catheterization 08/01/2016   a.) LHC 08/01/2016 --> normal coronaries; b.) LHC 04/04/2021: normal coronaries   Hypertension    Long term current use of aspirin    Meningioma (HCC) 07/29/2016   a.) CT head 07/29/2016:  11 x 12 x 11 mm at the high posterior RIGHT parietal region; b.) CT head 01/08/2017: stable at 12 mm   Mild cardiomegaly    NSTEMI (non-ST elevated myocardial infarction) (HCC) 07/29/2016   a.) NSTEMI 07/29/2016: troponins trended 2.54 --> 3.02 --> 3.87 --> 3.80 ng/L; b.) LHC 08/01/2016: normal coronaries   OSA (obstructive sleep apnea)    a.) does not utilize nocturnal PAP therapy   Primary hyperparathyroidism (HCC)    a.) s/p  parathyroidectomy 06/07/2020; parathyroid adenoma identifed and resected   Umbilical hernia     Social History   Socioeconomic History   Marital status: Significant Other    Spouse name: Chrissie Noa   Number of children: Not on file   Years of education: Not on file   Highest education level: Not on file  Occupational History   Not on file  Tobacco Use   Smoking status: Never   Smokeless tobacco: Never  Substance and Sexual Activity   Alcohol use: No   Drug use: No   Sexual activity: Not on file  Other Topics Concern   Not on file  Social History Narrative   Not on file   Social Determinants of Health   Financial Resource Strain: Low Risk  (11/01/2022)   [facility]   Overall Financial Resource Strain (CARDIA)    Difficulty of Paying Living Expenses: Not very hard  Food Insecurity: No Food Insecurity (11/01/2022)   [facility], [facility]   Hunger Vital Sign    Worried About Running Out of Food in the Last Year: Never true     Ran Out of Food in the Last Year: Never true  Transportation Needs: No Transportation Needs (11/01/2022)   Received from [facility], [facility]   [facility] - Transportation    Lack of Transportation (Medical): No    Lack of Transportation (Non-Medical): No  Physical Activity: Inactive (06/09/2021)   Received from [facility], [facility]   Exercise Vital Sign    Days of Exercise per Week: 0 days    Minutes of Exercise per Session: 0 min  Stress: No Stress Concern Present (06/09/2021)   Received from [facility], [facility] of Occupational Health - Occupational Stress Questionnaire    Feeling of Stress : Not at all  Social Connections: Moderately Isolated (06/09/2021)   Received from [facility], [facility]   Social Connection and Isolation Panel [NHANES]    Frequency of Communication with Friends and Family: More than three times a week    Frequency of Social Gatherings with Friends and Family: More than three times a week    Attends Religious Services: 1 to 4 times per year    Active Member of [employer] or Organizations: No    Attends Banker Meetings: Never    Marital Status: Never married  Intimate Partner Violence: Not At Risk (06/09/2021)   Received from [facility], [facility]   Humiliation, Afraid, Rape, and Kick questionnaire    Fear of Current or Ex-Partner: No    Emotionally Abused: No    Physically Abused: No    Sexually Abused: No    Past Surgical History:  Procedure Laterality Date   AV FISTULA PLACEMENT Left 10/20/2022   Procedure: ARTERIOVENOUS (AV) FISTULA CREATION ( BRACHIALCEPHALIC);  Surgeon: Renford Dills, MD;  Location: [facility] ORS;  Service: Vascular;  Laterality: Left;   CYSTOSCOPY Bilateral 11/06/2016   Procedure: CYSTOSCOPY with stent placement;  Surgeon: Christeen Douglas, MD;  Location: [facility] ORS;  Service: Gynecology;  Laterality: Bilateral;   HYSTERECTOMY ABDOMINAL WITH  SALPINGECTOMY Left 11/06/2016   Procedure: HYSTERECTOMY ABDOMINAL WITH SALPINGECTOMY;  Surgeon: Christeen Douglas, MD;  Location: [facility] ORS;  Service: Gynecology;  Laterality: Left;   LEFT HEART CATH AND CORONARY ANGIOGRAPHY Right 08/01/2016   Procedure: Left Heart Cath and Coronary Angiography;  Surgeon: Laurier Nancy, MD;  Location: [facility] INVASIVE CV  LAB;  Service: Cardiovascular;  Laterality: Right;   LEFT HEART CATH AND CORONARY ANGIOGRAPHY Left 04/04/2021   Procedure: LEFT HEART CATH AND CORONARY ANGIOGRAPHY; Location: [facility]; Surgeon: Eppie Gibson, MD   PARATHYROIDECTOMY N/A 06/07/2020   ROTATOR CUFF REPAIR Left     Family History  Problem Relation Age of Onset   CAD Father    Breast cancer Neg Hx     Allergies  Allergen Reactions   Hydrochlorothiazide Cough   Latex Swelling and Rash    Other reaction(s): Unknown   Shrimp [Shellfish Allergy] Anaphylaxis   Ace Inhibitors Cough       Latest Ref Rng & Units 10/20/2022   11:08 AM 10/10/2022    9:51 AM 03/21/2021   10:41 AM  CBC  WBC 4.0 - 10.5 K/uL  6.9  5.5   Hemoglobin 12.0 - 15.0 g/dL 78.2  95.6  21.3   Hematocrit 36.0 - 46.0 % 38.0  41.0  42.1   Platelets 150 - 400 K/uL  295  286       CMP     Component Value Date/Time   NA 142 10/20/2022 1108   K 4.1 10/20/2022 1108   CL 109 10/20/2022 1108   CO2 23 03/21/2021 1041   GLUCOSE 84 10/20/2022 1108   BUN 42 (H) 10/20/2022 1108   CREATININE 2.60 (H) 10/20/2022 1108   CALCIUM 9.7 03/21/2021 1041   CALCIUM 9.4 12/21/2020 1145   PROT 7.6 07/24/2020 0808   ALBUMIN 3.5 03/21/2021 1041   AST 18 07/24/2020 0808   ALT 18 07/24/2020 0808   ALKPHOS 114 07/24/2020 0808   BILITOT 0.7 07/24/2020 0808   GFRNONAA 23 (L) 03/21/2021 1041     No results found.     Assessment & Plan:   1. Stage 4 chronic kidney disease (HCC) Today the patient's wound is healed nicely.  The concern is the depth of her fistula.  We had a discussion in that based on the depth of the fistula she  may need to have superficialization done.  The patient is not yet on dialysis and it appears that her GFR has been improved recently.  Based on the discussion we had it may be months or longer before the patient may need to utilize her fistula.  We discussed that we could move forward with not doing anything to her access and just giving the dialysis center time to see if they would be able to access her fistula.  If so no further intervention will be necessary.  Alternatively if they are unable to access it then we would need to likely place a PermCath and then have superficialization of the fistula done.  The patient wishes to avoid a PermCath that she needs at family members significant difficulties with PermCath in the past.  Based on this we will plan on close follow-up until she is a little bit closer to needing to undergo dialysis.  This is also because the patient will also need to be out of work likely for several days post superficialization.  Will plan to follow-up in 3 months however she notes that her GFR decreases or if there is some indication she may need to proceed with dialysis sooner   Current Outpatient Medications on File Prior to Visit  Medication Sig Dispense Refill   acetaminophen (TYLENOL) 500 MG tablet Take 2 tablets (1,000 mg total) by mouth every 6 (six) hours. 30 tablet 0   amLODipine (NORVASC) 5 MG tablet Take 1 tablet (5 mg total)  by mouth daily. 60 tablet 3   aspirin 325 MG tablet Take 1 tablet (325 mg total) by mouth daily. (Patient taking differently: Take 81 mg by mouth 2 (two) times daily.) 30 tablet 2   atorvastatin (LIPITOR) 40 MG tablet Take 1 tablet (40 mg total) by mouth daily at 6 PM. 30 tablet 2   docusate sodium (COLACE) 100 MG capsule Take 1 capsule (100 mg total) by mouth 2 (two) times daily. 60 capsule 0   febuxostat (ULORIC) 40 MG tablet Take 40 mg by mouth daily.     furosemide (LASIX) 20 MG tablet Take 20 mg by mouth daily.     gabapentin (NEURONTIN) 300  MG capsule TAKE 1 CAPSULE BY MOUTH THREE TIMES DAILY MAY CAUSE DROWSINESS     hydrALAZINE (APRESOLINE) 100 MG tablet Take 1 tablet (100 mg total) by mouth 3 (three) times daily. (Patient taking differently: Take 100 mg by mouth 2 (two) times daily.) 90 tablet 4   HYDROcodone-acetaminophen (NORCO) 5-325 MG tablet Take 1 tablet by mouth every 6 (six) hours as needed for moderate pain. 40 tablet 0   losartan (COZAAR) 100 MG tablet Take 100 mg by mouth daily.     metoprolol tartrate (LOPRESSOR) 100 MG tablet Take 1 tablet (100 mg total) by mouth 2 (two) times daily. 60 tablet 4   omeprazole (PRILOSEC) 20 MG capsule Take 20 mg by mouth daily.     traMADol (ULTRAM) 50 MG tablet Take 50 mg by mouth every 12 (twelve) hours as needed for moderate pain.     cyclobenzaprine (FLEXERIL) 10 MG tablet TAKE 1 TABLET BY MOUTH AS NEEDED AT BEDTIME (Patient not taking: Reported on 10/20/2022)     diazepam (VALIUM) 10 MG tablet Take 10 mg by mouth as needed for anxiety. May take with a procedure (Patient not taking: Reported on 10/20/2022)     No current facility-administered medications on file prior to visit.    There are no Patient Instructions on file for this visit. No follow-ups on file.   Georgiana Spinner, NP

## 2023-02-28 ENCOUNTER — Telehealth (INDEPENDENT_AMBULATORY_CARE_PROVIDER_SITE_OTHER): Payer: Self-pay | Admitting: Nurse Practitioner

## 2023-02-28 NOTE — Telephone Encounter (Signed)
Patient called [facility], stated arm cramping and feels like chains in hand. Patient currently working at job at [employer]. Patient decided while on the phone to go to ER because of pain. FYI

## 2023-03-06 ENCOUNTER — Other Ambulatory Visit (INDEPENDENT_AMBULATORY_CARE_PROVIDER_SITE_OTHER): Payer: Self-pay | Admitting: Nurse Practitioner

## 2023-03-06 DIAGNOSIS — N184 Chronic kidney disease, stage 4 (severe): Secondary | ICD-10-CM

## 2023-03-09 ENCOUNTER — Ambulatory Visit (INDEPENDENT_AMBULATORY_CARE_PROVIDER_SITE_OTHER): Payer: Medicaid Other | Admitting: Nurse Practitioner

## 2023-03-09 ENCOUNTER — Ambulatory Visit (INDEPENDENT_AMBULATORY_CARE_PROVIDER_SITE_OTHER): Payer: Medicaid Other

## 2023-03-09 ENCOUNTER — Encounter (INDEPENDENT_AMBULATORY_CARE_PROVIDER_SITE_OTHER): Payer: Medicaid Other

## 2023-03-09 DIAGNOSIS — N184 Chronic kidney disease, stage 4 (severe): Secondary | ICD-10-CM | POA: Diagnosis not present

## 2023-03-23 ENCOUNTER — Encounter (INDEPENDENT_AMBULATORY_CARE_PROVIDER_SITE_OTHER): Payer: Self-pay | Admitting: Nurse Practitioner

## 2023-03-23 ENCOUNTER — Encounter (INDEPENDENT_AMBULATORY_CARE_PROVIDER_SITE_OTHER): Payer: Medicaid Other

## 2023-03-23 ENCOUNTER — Ambulatory Visit (INDEPENDENT_AMBULATORY_CARE_PROVIDER_SITE_OTHER): Payer: Medicaid Other | Admitting: Nurse Practitioner

## 2023-03-23 VITALS — BP 130/85 | HR 79 | Resp 18 | Ht 64.0 in | Wt 342.4 lb

## 2023-03-23 DIAGNOSIS — N184 Chronic kidney disease, stage 4 (severe): Secondary | ICD-10-CM

## 2023-03-24 ENCOUNTER — Encounter (INDEPENDENT_AMBULATORY_CARE_PROVIDER_SITE_OTHER): Payer: Self-pay | Admitting: Nurse Practitioner

## 2023-03-24 NOTE — Progress Notes (Signed)
Subjective:    Patient ID: [name], female    DOB: [birth year], 52 y.o.   MRN: [id number] Chief Complaint  Patient presents with   Follow-up    3 month follow up with HDA    The patient is a 52 year old female who presents today for follow-up evaluation of her left brachiocephalic AV fistula.  This access was placed on 10/20/2022.  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  She does endorse having some cramping in her arm but none that is specifically located in her hand per se.  She still continues to work.  Her numbers have remained fairly stable and patient is not yet on dialysis.  The patient has a follow-up with 2026.  There is a noted area in the mid upper arm concerning for possible kink versus retained valve    Review of Systems  Skin:  Positive for wound.  All other systems reviewed and are negative.      Objective:   Physical Exam Vitals reviewed.  HENT:     Head: Normocephalic.  Cardiovascular:     Rate and Rhythm: Normal rate.     Pulses:          Radial pulses are 2+ on the left side.     Arteriovenous access: Left arteriovenous access is present.    Comments: Good thrill and bruit Pulmonary:     Effort: Pulmonary effort is normal.  Skin:    General: Skin is warm and dry.  Neurological:     Mental Status: She is alert and oriented to person, place, and time.  Psychiatric:        Mood and Affect: Mood normal.        Behavior: Behavior normal.        Thought Content: Thought content normal.        Judgment: Judgment normal.       BP 130/85 (BP Location: Right Wrist)   Pulse 79   Resp 18   Ht 5\' 4"  (1.626 m)   Wt (!) 342 lb 6.4 oz (155.3 kg)   LMP  (LMP Unknown)   BMI 58.77 kg/m   Past Medical History:  Diagnosis Date   Anemia    Anginal pain (HCC)    Anxiety    a.) on BZO (diazepam) PRN   Arthritis    Cerebellar stroke (HCC)    a.) CT head 07/29/2016 --> small old infarct at posterior RIGHT cerebellum    Cholelithiasis    CKD (chronic kidney disease), stage IV (HCC)    Diastasis recti    Diastolic dysfunction 07/31/2016   a.) TTE 07/31/2016: EF 65%, mild LV dil, mod MR, G1DD   Diverticulosis    DOE (dyspnea on exertion)    Fibroid uterus    GERD (gastroesophageal reflux disease)    History of left heart catheterization 08/01/2016   a.) LHC 08/01/2016 --> normal coronaries; b.) LHC 04/04/2021: normal coronaries   Hypertension    Long term current use of aspirin    Meningioma (HCC) 07/29/2016   a.) CT head 07/29/2016:  11 x 12 x 11 mm at the high posterior RIGHT parietal region; b.) CT head 01/08/2017: stable at 12 mm   Mild cardiomegaly    NSTEMI (non-ST elevated myocardial infarction) (HCC) 07/29/2016   a.) NSTEMI 07/29/2016: troponins trended 2.54 --> 3.02 --> 3.87 --> 3.80 ng/L; b.) LHC 08/01/2016: normal coronaries   OSA (obstructive sleep apnea)    a.) does not  utilize nocturnal PAP therapy   Primary hyperparathyroidism (HCC)    a.) s/p parathyroidectomy 06/07/2020; parathyroid adenoma identifed and resected   Umbilical hernia     Social History   Socioeconomic History   Marital status: Significant Other    Spouse name: Chrissie Noa   Number of children: Not on file   Years of education: Not on file   Highest education level: Not on file  Occupational History   Not on file  Tobacco Use   Smoking status: Never   Smokeless tobacco: Never  Substance and Sexual Activity   Alcohol use: No   Drug use: No   Sexual activity: Not on file  Other Topics Concern   Not on file  Social History Narrative   Not on file   Social Determinants of Health   Financial Resource Strain: Low Risk  (11/01/2022)   [facility]   Overall Financial Resource Strain (CARDIA)    Difficulty of Paying Living Expenses: Not very hard  Food Insecurity: No Food Insecurity (11/01/2022)   [facility]   Hunger Vital Sign     Worried About Running Out of Food in the Last Year: Never true    Ran Out of Food in the Last Year: Never true  Transportation Needs: No Transportation Needs (11/01/2022)   Received from [facility]   [facility] - Transportation    Lack of Transportation (Medical): No    Lack of Transportation (Non-Medical): No  Physical Activity: Inactive (06/09/2021)   Received from [facility], [facility]   Exercise Vital Sign    Days of Exercise per Week: 0 days    Minutes of Exercise per Session: 0 min  Stress: No Stress Concern Present (06/09/2021)   Received from [facility], [facility] of Occupational Health - Occupational Stress Questionnaire    Feeling of Stress : Not at all  Social Connections: Moderately Isolated (06/09/2021)   Received from [facility], [facility]   Social Connection and Isolation Panel [NHANES]    Frequency of Communication with Friends and Family: More than three times a week    Frequency of Social Gatherings with Friends and Family: More than three times a week    Attends Religious Services: 1 to 4 times per year    Active Member of [employer] or Organizations: No    Attends Banker Meetings: Never    Marital Status: Never married  Intimate Partner Violence: Not At Risk (06/09/2021)   Received from [facility], [facility]   Humiliation, Afraid, Rape, and Kick questionnaire    Fear of Current or Ex-Partner: No    Emotionally Abused: No    Physically Abused: No    Sexually Abused: No    Past Surgical History:  Procedure Laterality Date   AV FISTULA PLACEMENT Left 10/20/2022   Procedure: ARTERIOVENOUS (AV) FISTULA CREATION ( BRACHIALCEPHALIC);  Surgeon: Renford Dills, MD;  Location: [facility] ORS;  Service: Vascular;  Laterality: Left;   CYSTOSCOPY Bilateral 11/06/2016   Procedure: CYSTOSCOPY with stent placement;  Surgeon: Christeen Douglas, MD;  Location: [facility] ORS;  Service: Gynecology;   Laterality: Bilateral;   HYSTERECTOMY ABDOMINAL WITH SALPINGECTOMY Left 11/06/2016   Procedure: HYSTERECTOMY ABDOMINAL WITH SALPINGECTOMY;  Surgeon: Christeen Douglas, MD;  Location: [facility] ORS;  Service: Gynecology;  Laterality: Left;   LEFT HEART CATH AND CORONARY ANGIOGRAPHY Right 08/01/2016   Procedure: Left Heart Cath  and Coronary Angiography;  Surgeon: Laurier Nancy, MD;  Location: [facility] INVASIVE CV LAB;  Service: Cardiovascular;  Laterality: Right;   LEFT HEART CATH AND CORONARY ANGIOGRAPHY Left 04/04/2021   Procedure: LEFT HEART CATH AND CORONARY ANGIOGRAPHY; Location: [facility]; Surgeon: Eppie Gibson, MD   PARATHYROIDECTOMY N/A 06/07/2020   ROTATOR CUFF REPAIR Left     Family History  Problem Relation Age of Onset   CAD Father    Breast cancer Neg Hx     Allergies  Allergen Reactions   Hydrochlorothiazide Cough   Latex Swelling and Rash    Other reaction(s): Unknown   Shrimp [Shellfish Allergy] Anaphylaxis   Ace Inhibitors Cough       Latest Ref Rng & Units 10/20/2022   11:08 AM 10/10/2022    9:51 AM 03/21/2021   10:41 AM  CBC  WBC 4.0 - 10.5 K/uL  6.9  5.5   Hemoglobin 12.0 - 15.0 g/dL 56.2  13.0  86.5   Hematocrit 36.0 - 46.0 % 38.0  41.0  42.1   Platelets 150 - 400 K/uL  295  286       CMP     Component Value Date/Time   NA 142 10/20/2022 1108   K 4.1 10/20/2022 1108   CL 109 10/20/2022 1108   CO2 23 03/21/2021 1041   GLUCOSE 84 10/20/2022 1108   BUN 42 (H) 10/20/2022 1108   CREATININE 2.60 (H) 10/20/2022 1108   CALCIUM 9.7 03/21/2021 1041   CALCIUM 9.4 12/21/2020 1145   PROT 7.6 07/24/2020 0808   ALBUMIN 3.5 03/21/2021 1041   AST 18 07/24/2020 0808   ALT 18 07/24/2020 0808   ALKPHOS 114 07/24/2020 0808   BILITOT 0.7 07/24/2020 0808   GFRNONAA 23 (L) 03/21/2021 1041     No results found.     Assessment & Plan:   1. Stage 4 chronic kidney disease (HCC) The patient notes that her numbers continue to go up and down.  She is not yet ready for dialysis.   I think that at this time he would be a good time to superficial lysed her fistula.  In this instance if there is a kink or narrowing from the vessel we can repair that with her superficialization versus needing a possible fistulogram for exploration of damaged valves.  Fistulogram with expose the patient to contrast dye potentially move her closer towards dialysis which we like to void at this time.  I discussed the risk, benefits alternatives superficialization the patient is agreeable to proceed.  Following superficialization and healing if the dialysis access is necessary for use it can be utilized.  Current Outpatient Medications on File Prior to Visit  Medication Sig Dispense Refill   nortriptyline (PAMELOR) 10 MG capsule Start Nortriptyline (Pamelor) 10 mg nightly for one week, then increase to 20 mg nightly     acetaminophen (TYLENOL) 500 MG tablet Take 2 tablets (1,000 mg total) by mouth every 6 (six) hours. 30 tablet 0   amLODipine (NORVASC) 5 MG tablet Take 1 tablet (5 mg total) by mouth daily. 60 tablet 3   aspirin 325 MG tablet Take 1 tablet (325 mg total) by mouth daily. (Patient taking differently: Take 81 mg by mouth 2 (two) times daily.) 30 tablet 2   atorvastatin (LIPITOR) 40 MG tablet Take 1 tablet (40 mg total) by mouth daily at 6 PM. 30 tablet 2   Cholecalciferol (VITAMIN D3) 50 MCG (2000 UT) capsule Take 2,000 Units by mouth daily.  cyclobenzaprine (FLEXERIL) 10 MG tablet TAKE 1 TABLET BY MOUTH AS NEEDED AT BEDTIME (Patient not taking: Reported on 10/20/2022)     diazepam (VALIUM) 10 MG tablet Take 10 mg by mouth as needed for anxiety. May take with a procedure (Patient not taking: Reported on 10/20/2022)     docusate sodium (COLACE) 100 MG capsule Take 1 capsule (100 mg total) by mouth 2 (two) times daily. 60 capsule 0   febuxostat (ULORIC) 40 MG tablet Take 40 mg by mouth daily.     furosemide (LASIX) 20 MG tablet Take 20 mg by mouth daily.     gabapentin (NEURONTIN) 300 MG  capsule TAKE 1 CAPSULE BY MOUTH THREE TIMES DAILY MAY CAUSE DROWSINESS     hydrALAZINE (APRESOLINE) 100 MG tablet Take 1 tablet (100 mg total) by mouth 3 (three) times daily. (Patient not taking: Reported on 03/23/2023) 90 tablet 4   HYDROcodone-acetaminophen (NORCO) 5-325 MG tablet Take 1 tablet by mouth every 6 (six) hours as needed for moderate pain. 40 tablet 0   losartan (COZAAR) 100 MG tablet Take 100 mg by mouth daily.     metoprolol succinate (TOPROL-XL) 50 MG 24 hr tablet Take 50 mg by mouth daily.     metoprolol tartrate (LOPRESSOR) 100 MG tablet Take 1 tablet (100 mg total) by mouth 2 (two) times daily. 60 tablet 4   omeprazole (PRILOSEC) 20 MG capsule Take 20 mg by mouth daily.     traMADol (ULTRAM) 50 MG tablet Take 50 mg by mouth every 12 (twelve) hours as needed for moderate pain.     No current facility-administered medications on file prior to visit.    There are no Patient Instructions on file for this visit. No follow-ups on file.   Georgiana Spinner, NP

## 2023-05-11 NOTE — Progress Notes (Signed)
 Sleep Medicine   Office Visit  Patient Name: [name] DOB: 01-19-1971 MRN [id number]    Chief Complaint: sleep evaluation  Brief History:  [name] presents for an initial consult for sleep evaluation and to establish care. Patient has some years history of excessive daytime sleepiness. Sleep quality is poor. This is noted every night. The patient's bed partner reports  snoring, gasping, and witnessed apneic pauses at night. The patient relates the following symptoms: headaches, trouble concentrating, brain fogginess and excessive fatigue are also present. The patient goes to sleep at 1000 pm and wakes up at 0600 am and will wake up several times in between with some trouble returning to sleep.  Sleep quality is better when outside home environment.  Patient has noted significant movement of her legs at night that would disrupt her sleep.  The patient  relates sleep talking as unusual behavior during the night.  The patient relates no history of psychiatric problems. The Epworth Sleepiness Score is 5 out of 24 .  The patient relates  Cardiovascular risk factors include: coronary artery disease, hypertension, stroke.     ROS  General: (-) fever, (-) chills, (-) night sweat Nose and Sinuses: (-) nasal stuffiness or itchiness, (-) postnasal drip, (-) nosebleeds, (-) sinus trouble. Mouth and Throat: (-) sore throat, (-) hoarseness. Neck: (-) swollen glands, (-) enlarged thyroid, (-) neck pain. Respiratory: + cough, + shortness of breath, + wheezing. Neurologic: - numbness, - tingling. Psychiatric: - anxiety, - depression Sleep behavior: -sleep paralysis -hypnogogic hallucinations -dream enactment      -vivid dreams -cataplexy -night terrors -sleep walking   Current Medication: Outpatient Encounter Medications as of 05/14/2023  Medication Sig Note   amLODipine  (NORVASC ) 10 MG tablet Take 10 mg by mouth daily.    furosemide (LASIX) 40 MG tablet Take by mouth.    acetaminophen   (TYLENOL ) 500 MG tablet Take 2 tablets (1,000 mg total) by mouth every 6 (six) hours.    aspirin  325 MG tablet Take 1 tablet (325 mg total) by mouth daily. (Patient taking differently: Take 81 mg by mouth 2 (two) times daily.)    atorvastatin  (LIPITOR) 40 MG tablet Take 1 tablet (40 mg total) by mouth daily at 6 PM.    Cholecalciferol (VITAMIN D3) 50 MCG (2000 UT) capsule Take 2,000 Units by mouth daily.    cyclobenzaprine (FLEXERIL) 10 MG tablet TAKE 1 TABLET BY MOUTH AS NEEDED AT BEDTIME (Patient not taking: Reported on 10/20/2022)    diazepam (VALIUM) 10 MG tablet Take 10 mg by mouth as needed for anxiety. May take with a procedure (Patient not taking: Reported on 10/20/2022)    docusate sodium  (COLACE) 100 MG capsule Take 1 capsule (100 mg total) by mouth 2 (two) times daily.    febuxostat (ULORIC) 40 MG tablet Take 40 mg by mouth daily.    gabapentin  (NEURONTIN ) 300 MG capsule TAKE 1 CAPSULE BY MOUTH THREE TIMES DAILY MAY CAUSE DROWSINESS    hydrALAZINE  (APRESOLINE ) 100 MG tablet Take 1 tablet (100 mg total) by mouth 3 (three) times daily. (Patient not taking: Reported on 03/23/2023) 01/08/2017: Regular use d/c, takes as needed.   HYDROcodone -acetaminophen  (NORCO) 5-325 MG tablet Take 1 tablet by mouth every 6 (six) hours as needed for moderate pain.    losartan  (COZAAR ) 100 MG tablet Take 100 mg by mouth daily.    metoprolol  succinate (TOPROL -XL) 50 MG 24 hr tablet Take 50 mg by mouth daily.    metoprolol  tartrate (LOPRESSOR ) 100 MG tablet Take 1  tablet (100 mg total) by mouth 2 (two) times daily.    nortriptyline (PAMELOR) 10 MG capsule Start Nortriptyline (Pamelor) 10 mg nightly for one week, then increase to 20 mg nightly    omeprazole (PRILOSEC) 20 MG capsule Take 20 mg by mouth daily.    traMADol  (ULTRAM ) 50 MG tablet Take 50 mg by mouth every 12 (twelve) hours as needed for moderate pain.    [DISCONTINUED] amLODipine  (NORVASC ) 5 MG tablet Take 1 tablet (5 mg total) by mouth daily.     [DISCONTINUED] furosemide (LASIX) 20 MG tablet Take 20 mg by mouth daily.    No facility-administered encounter medications on file as of 05/14/2023.    Surgical History: Past Surgical History:  Procedure Laterality Date   AV FISTULA PLACEMENT Left 10/20/2022   Procedure: ARTERIOVENOUS (AV) FISTULA CREATION ( BRACHIALCEPHALIC);  Surgeon: Jama Cordella MATSU, MD;  Location: [facility];  Service: Vascular;  Laterality: Left;   CYSTOSCOPY Bilateral 11/06/2016   Procedure: CYSTOSCOPY with stent placement;  Surgeon: Verdon Keen, MD;  Location: [facility];  Service: Gynecology;  Laterality: Bilateral;   HYSTERECTOMY ABDOMINAL WITH SALPINGECTOMY Left 11/06/2016   Procedure: HYSTERECTOMY ABDOMINAL WITH SALPINGECTOMY;  Surgeon: Verdon Keen, MD;  Location: [facility];  Service: Gynecology;  Laterality: Left;   LEFT HEART CATH AND CORONARY ANGIOGRAPHY Right 08/01/2016   Procedure: Left Heart Cath and Coronary Angiography;  Surgeon: Denyse DELENA Bathe, MD;  Location: [facility];  Service: Cardiovascular;  Laterality: Right;   LEFT HEART CATH AND CORONARY ANGIOGRAPHY Left 04/04/2021   Procedure: LEFT HEART CATH AND CORONARY ANGIOGRAPHY; Location: [facility]; Surgeon: Fairy Ship, MD   PARATHYROIDECTOMY N/A 06/07/2020   ROTATOR CUFF REPAIR Left     Medical History: Past Medical History:  Diagnosis Date   Anemia    Anginal pain (HCC)    Anxiety    a.) on BZO (diazepam) PRN   Arthritis    Cerebellar stroke (HCC)    a.) CT head 07/29/2016 --> small old infarct at posterior RIGHT cerebellum   Cholelithiasis    CKD (chronic kidney disease), stage IV (HCC)    Diastasis recti    Diastolic dysfunction 07/31/2016   a.) TTE 07/31/2016: EF 65%, mild LV dil, mod MR, G1DD   Diverticulosis    DOE (dyspnea on exertion)    Fibroid uterus    GERD (gastroesophageal reflux disease)    History of left heart catheterization 08/01/2016   a.) LHC 08/01/2016 --> normal coronaries; b.) LHC 04/04/2021: normal  coronaries   Hypertension    Long term current use of aspirin     Meningioma (HCC) 07/29/2016   a.) CT head 07/29/2016:  11 x 12 x 11 mm at the high posterior RIGHT parietal region; b.) CT head 01/08/2017: stable at 12 mm   Mild cardiomegaly    NSTEMI (non-ST elevated myocardial infarction) (HCC) 07/29/2016   a.) NSTEMI 07/29/2016: troponins trended 2.54 --> 3.02 --> 3.87 --> 3.80 ng/L; b.) LHC 08/01/2016: normal coronaries   OSA (obstructive sleep apnea)    a.) does not utilize nocturnal PAP therapy   Primary hyperparathyroidism (HCC)    a.) s/p parathyroidectomy 06/07/2020; parathyroid  adenoma identifed and resected   Umbilical hernia     Family History: Non contributory to the present illness  Social History: Social History   Socioeconomic History   Marital status: Significant Other    Spouse name: Elsie   Number of children: Not on file   Years of education: Not on file   Highest education level: Not  on file  Occupational History   Not on file  Tobacco Use   Smoking status: Never   Smokeless tobacco: Never  Substance and Sexual Activity   Alcohol use: No   Drug use: No   Sexual activity: Not on file  Other Topics Concern   Not on file  Social History Narrative   Not on file   Social Drivers of Health   Financial Resource Strain: Low Risk  (11/01/2022)   [facility]   Overall Financial Resource Strain (CARDIA)    Difficulty of Paying Living Expenses: Not very hard  Food Insecurity: No Food Insecurity (11/01/2022)   [facility]   Hunger Vital Sign    Worried About Running Out of Food in the Last Year: Never true    Ran Out of Food in the Last Year: Never true  Transportation Needs: No Transportation Needs (11/01/2022)   Received from [facility]   [facility] - Transportation    Lack of Transportation (Medical): No    Lack of Transportation (Non-Medical): No   Physical Activity: Inactive (06/09/2021)   Received from [facility], [facility]   Exercise Vital Sign    Days of Exercise per Week: 0 days    Minutes of Exercise per Session: 0 min  Stress: No Stress Concern Present (06/09/2021)   Received from [facility], [facility] of Occupational [facility] - Occupational Stress Questionnaire    Feeling of Stress : Not at all  Social Connections: Moderately Isolated (06/09/2021)   Received from [facility], [facility]   Social Connection and Isolation Panel [NHANES]    Frequency of Communication with Friends and Family: More than three times a week    Frequency of Social Gatherings with Friends and Family: More than three times a week    Attends Religious Services: 1 to 4 times per year    Active Member of [employer] or Organizations: No    Attends Banker Meetings: Never    Marital Status: Never married  Intimate Partner Violence: Not At Risk (06/09/2021)   Received from [facility], [facility]   Humiliation, Afraid, Rape, and Kick questionnaire    Fear of Current or Ex-Partner: No    Emotionally Abused: No    Physically Abused: No    Sexually Abused: No    Vital Signs: Blood pressure (!) 162/86, pulse 84, resp. rate 20, height 5' 4 (1.626 m), weight (!) 349 lb (158.3 kg), SpO2 96%. Body mass index is 59.91 kg/m.   Examination: General Appearance: The patient is well-developed, well-nourished, and in no distress. Neck Circumference: 46 cm Skin: Gross inspection of skin unremarkable. Head: normocephalic, no gross deformities. Eyes: no gross deformities noted. ENT: ears appear grossly normal Neurologic: Alert and oriented. No involuntary movements.    STOP BANG RISK ASSESSMENT S (snore) Have you been told that you snore?     YES   T (tired) Are you often tired, fatigued, or sleepy during the day?   YES  O (obstruction) Do you stop breathing, choke, or gasp during sleep? YES    P (pressure) Do you have or are you being treated for high blood pressure? YES   B (BMI) Is your body index greater than 35 kg/m? YES   A (age) Are you 67 years old or older? YES   N (neck) Do you have a neck circumference greater than  16 inches?   YES   G (gender) Are you a female? NO   TOTAL STOP/BANG "YES" ANSWERS 7                                                               A STOP-Bang score of 2 or less is considered low risk, and a score of 5 or more is high risk for having either moderate or severe OSA. For people who score 3 or 4, doctors may need to perform further assessment to determine how likely they are to have OSA.         EPWORTH SLEEPINESS SCALE:  Scale:  (0)= no chance of dozing; (1)= slight chance of dozing; (2)= moderate chance of dozing; (3)= high chance of dozing  Chance  Situtation    Sitting and reading: 1    Watching TV: 1    Sitting Inactive in public: 0    As a passenger in car: 1      Lying down to rest: 1    Sitting and talking: 0    Sitting quielty after lunch: 1    In a car, stopped in traffic: 0   TOTAL SCORE:   5 out of 24    SLEEP STUDIES:  None   LABS: No results found for this or any previous visit (from the past 2160 hours).  Radiology: No results found.  No results found.  No results found.    Assessment and Plan: Patient Active Problem List   Diagnosis Date Noted   GERD (gastroesophageal reflux disease) 06/19/2022   Hyperlipidemia 06/19/2022   Stage 4 chronic kidney disease (HCC) 02/18/2019   Fibroid uterus 11/04/2016   Iron deficiency anemia due to chronic blood loss    NSTEMI (non-ST elevated myocardial infarction) (HCC) 07/29/2016   Severe anemia 09/13/2015   IRREGULAR MENSES 05/28/2007   COUGH 04/12/2007   OBESITY, MORBID 11/24/2006   ANEMIA-IRON DEFICIENCY 11/24/2006   Essential hypertension 11/24/2006   1. Witnessed episode of apnea (Primary) Patient evaluation suggests high risk of sleep  disordered breathing due to witnessed apnea, morbid obesity, snoring, gasping, morning headaches, brain fog.  Patient has comorbid cardiovascular risk factors including: coronary artery disease, stroke, hypertension which could be exacerbated by pathologic sleep-disordered breathing.  Suggest: PSG  to assess/treat the patient's sleep disordered breathing. The patient was also counselled on weight loss to optimize sleep health.  2. OBESITY, MORBID Obesity Counseling: Had a lengthy discussion regarding patients BMI and weight issues. Patient was instructed on portion control as well as increased activity. Also discussed caloric restrictions with trying to maintain intake less than 2000 Kcal. Discussions were made in accordance with the 5As of weight management. Simple actions such as not eating late and if able to, taking a walk is suggested.   3. Coronary artery disease involving native heart, unspecified vessel or lesion type, unspecified whether angina present Continue medications and follow up with cardiologist.     General Counseling: I have discussed the findings of the evaluation and examination with Bryana.  I have also discussed any further diagnostic evaluation thatmay be needed or ordered today. Gerri verbalizes understanding of the findings of todays visit. We also reviewed her medications today and discussed drug interactions and side effects including but not limited excessive drowsiness  and altered mental states. We also discussed that there is always a risk not just to her but also people around her. she has been encouraged to call the office with any questions or concerns that should arise related to todays visit.  No orders of the defined types were placed in this encounter.       I have personally obtained a history, evaluated the patient, evaluated pertinent data, formulated the assessment and plan and placed orders.  This patient was seen today by Lauraine Lay, PA-C in  collaboration with Dr. Elfreda Bathe.    Elfreda DELENA Bathe, MD [facility] Diplomate ABMS Pulmonary and Critical Care Medicine Sleep medicine

## 2023-05-14 ENCOUNTER — Ambulatory Visit (INDEPENDENT_AMBULATORY_CARE_PROVIDER_SITE_OTHER): Payer: Medicare HMO | Admitting: Internal Medicine

## 2023-05-14 VITALS — BP 162/86 | HR 84 | Resp 20 | Ht 64.0 in | Wt 349.0 lb

## 2023-05-14 DIAGNOSIS — I2510 Atherosclerotic heart disease of native coronary artery without angina pectoris: Secondary | ICD-10-CM | POA: Diagnosis not present

## 2023-05-14 DIAGNOSIS — R0681 Apnea, not elsewhere classified: Secondary | ICD-10-CM | POA: Diagnosis not present

## 2023-05-14 NOTE — Patient Instructions (Signed)

## 2023-05-23 ENCOUNTER — Telehealth (INDEPENDENT_AMBULATORY_CARE_PROVIDER_SITE_OTHER): Payer: Self-pay

## 2023-05-23 NOTE — Telephone Encounter (Signed)
I attempted to contact the patient to schedule her for a left Superficialization of fistula possible transposition with Dr. Gilda Crease. A message was left for a return call.

## 2023-05-24 NOTE — Telephone Encounter (Signed)
Patient called back and is scheduled with Dr. Gilda Crease on 06/29/23 for a left Superficialization of fistula with possible transposition at the [facility]. Pre-op is son 06/18/23 at 8:00 am at the [facility]. Pre-surgical instructions were discussed and will be sent to Mychart and mailed.

## 2023-06-12 ENCOUNTER — Other Ambulatory Visit: Payer: Self-pay | Admitting: Neurology

## 2023-06-12 DIAGNOSIS — R519 Headache, unspecified: Secondary | ICD-10-CM

## 2023-06-14 ENCOUNTER — Encounter: Payer: Self-pay | Admitting: Neurology

## 2023-06-17 ENCOUNTER — Other Ambulatory Visit (INDEPENDENT_AMBULATORY_CARE_PROVIDER_SITE_OTHER): Payer: Self-pay | Admitting: Nurse Practitioner

## 2023-06-17 DIAGNOSIS — N186 End stage renal disease: Secondary | ICD-10-CM

## 2023-06-18 ENCOUNTER — Encounter
Admission: RE | Admit: 2023-06-18 | Discharge: 2023-06-18 | Disposition: A | Discharge status: Home / Self Care | Payer: Medicare HMO | Source: Ambulatory Visit | Attending: Vascular Surgery | Admitting: Vascular Surgery

## 2023-06-18 ENCOUNTER — Other Ambulatory Visit: Payer: Self-pay

## 2023-06-18 DIAGNOSIS — R9431 Abnormal electrocardiogram [ECG] [EKG]: Secondary | ICD-10-CM | POA: Insufficient documentation

## 2023-06-18 DIAGNOSIS — N186 End stage renal disease: Secondary | ICD-10-CM | POA: Diagnosis not present

## 2023-06-18 DIAGNOSIS — Z01810 Encounter for preprocedural cardiovascular examination: Secondary | ICD-10-CM | POA: Diagnosis present

## 2023-06-18 DIAGNOSIS — Z01812 Encounter for preprocedural laboratory examination: Secondary | ICD-10-CM | POA: Diagnosis present

## 2023-06-18 DIAGNOSIS — Z01818 Encounter for other preprocedural examination: Secondary | ICD-10-CM | POA: Insufficient documentation

## 2023-06-18 HISTORY — DX: Palpitations: R00.2

## 2023-06-18 LAB — CBC WITH DIFFERENTIAL/PLATELET
Abs Immature Granulocytes: 0.01 10*3/uL (ref 0.00–0.07)
Basophils Absolute: 0 10*3/uL (ref 0.0–0.1)
Basophils Relative: 0 %
Eosinophils Absolute: 0.2 10*3/uL (ref 0.0–0.5)
Eosinophils Relative: 3 %
HCT: 37.1 % (ref 36.0–46.0)
Hemoglobin: 11.5 g/dL — ABNORMAL LOW (ref 12.0–15.0)
Immature Granulocytes: 0 %
Lymphocytes Relative: 24 %
Lymphs Abs: 1.4 10*3/uL (ref 0.7–4.0)
MCH: 26.7 pg (ref 26.0–34.0)
MCHC: 31 g/dL (ref 30.0–36.0)
MCV: 86.3 fL (ref 80.0–100.0)
Monocytes Absolute: 0.5 10*3/uL (ref 0.1–1.0)
Monocytes Relative: 8 %
Neutro Abs: 4 10*3/uL (ref 1.7–7.7)
Neutrophils Relative %: 65 %
Platelets: 261 10*3/uL (ref 150–400)
RBC: 4.3 MIL/uL (ref 3.87–5.11)
RDW: 15.4 % (ref 11.5–15.5)
WBC: 6 10*3/uL (ref 4.0–10.5)
nRBC: 0 % (ref 0.0–0.2)

## 2023-06-18 LAB — BASIC METABOLIC PANEL
Anion gap: 9 (ref 5–15)
BUN: 54 mg/dL — ABNORMAL HIGH (ref 6–20)
CO2: 23 mmol/L (ref 22–32)
Calcium: 9 mg/dL (ref 8.9–10.3)
Chloride: 107 mmol/L (ref 98–111)
Creatinine, Ser: 3.05 mg/dL — ABNORMAL HIGH (ref 0.44–1.00)
GFR, Estimated: 18 mL/min — ABNORMAL LOW (ref >60)
Glucose, Bld: 86 mg/dL (ref 70–99)
Potassium: 3.5 mmol/L (ref 3.5–5.1)
Sodium: 139 mmol/L (ref 135–145)

## 2023-06-18 NOTE — Patient Instructions (Signed)
 Your procedure is scheduled on: Friday 06/29/23 Report to the Registration Desk on the 1st floor of the [facility]. To find out your arrival time, please call [phone] between 1PM - 3PM on: Thursday 06/28/23 If your arrival time is 6:00 am, do not arrive before that time as the [facility] entrance doors do not open until 6:00 am.  REMEMBER: Instructions that are not followed completely may result in serious medical risk, up to and including death; or upon the discretion of your surgeon and anesthesiologist your surgery may need to be rescheduled.  Do not eat food or drink fluids after midnight the night before surgery.  No gum chewing or hard candies.   One week prior to surgery: Stop Anti-inflammatories (NSAIDS) such as Advil, Aleve, Ibuprofen, Motrin, Naproxen, Naprosyn and Aspirin based products such as Excedrin, Goody's Powder, BC Powder. Stop ANY OVER THE COUNTER supplements until after surgery.  You may however, continue to take Tylenol if needed for pain up until the day of surgery.   Continue taking all of your other prescription medications up until the day of surgery.  ON THE DAY OF SURGERY ONLY TAKE THESE MEDICATIONS WITH SIPS OF WATER:  amLODipine (NORVASC) 10 MG  hydrALAZINE (APRESOLINE) 50 MG  metoprolol succinate (TOPROL-XL) 50 MG  omeprazole (PRILOSEC) 20 MG    No Alcohol for 24 hours before or after surgery.  No Smoking including e-cigarettes for 24 hours before surgery.  No chewable tobacco products for at least 6 hours before surgery.  No nicotine patches on the day of surgery.  Do not use any "recreational" drugs for at least a week (preferably 2 weeks) before your surgery.  Please be advised that the combination of cocaine and anesthesia may have negative outcomes, up to and including death. If you test positive for cocaine, your surgery will be cancelled.  On the morning of surgery brush your teeth with toothpaste and water, you may rinse your  mouth with mouthwash if you wish. Do not swallow any toothpaste or mouthwash.  Use CHG Soap or wipes as directed on instruction sheet.  Do not wear jewelry, make-up, hairpins, clips or nail polish.  For welded (permanent) jewelry: bracelets, anklets, waist bands, etc.  Please have this removed prior to surgery.  If it is not removed, there is a chance that hospital personnel will need to cut it off on the day of surgery.  Do not wear lotions, powders, or perfumes.   Do not shave body hair from the neck down 48 hours before surgery.  Contact lenses, hearing aids and dentures may not be worn into surgery.  Do not bring valuables to the hospital. [facility] is not responsible for any missing/lost belongings or valuables.   Total Shoulder Arthroplasty:  use Benzoyl Peroxide 5% Gel as directed on instruction sheet.  Bring your C-PAP to the hospital in case you may have to spend the night.   Notify your doctor if there is any change in your medical condition (cold, fever, infection).  Wear comfortable clothing (specific to your surgery type) to the hospital.  After surgery, you can help prevent lung complications by doing breathing exercises.  Take deep breaths and cough every 1-2 hours. Your doctor may order a device called an Incentive Spirometer to help you take deep breaths. When coughing or sneezing, hold a pillow firmly against your incision with both hands. This is called "splinting." Doing this helps protect your incision. It also decreases belly discomfort.  If you are being  admitted to the hospital overnight, leave your suitcase in the car. After surgery it may be brought to your room.  In case of increased patient census, it may be necessary for you, the patient, to continue your postoperative care in the Same Day Surgery department.  If you are being discharged the day of surgery, you will not be allowed to drive home. You will need a responsible individual to drive you home  and stay with you for 24 hours after surgery.   If you are taking public transportation, you will need to have a responsible individual with you.  Please call the Pre-admissions Testing Dept. at [phone] if you have any questions about these instructions.  Surgery Visitation Policy:  Patients having surgery or a procedure may have two visitors.  Children under the age of 56 must have an adult with them who is not the patient.  Temporary Visitor Restrictions Due to increasing cases of flu, RSV and COVID-19: Children ages 65 and under will not be able to visit patients in [facility] hospitals under most circumstances.  Inpatient Visitation:    Visiting hours are 7 a.m. to 8 p.m. Up to four visitors are allowed at one time in a patient room. The visitors may rotate out with other people during the day.  One visitor age 30 or older may stay with the patient overnight and must be in the room by 8 p.m.    Preparing for Surgery with CHLORHEXIDINE GLUCONATE (CHG) Soap  Chlorhexidine Gluconate (CHG) Soap  o An antiseptic cleaner that kills germs and bonds with the skin to continue killing germs even after washing  o Used for showering the night before surgery and morning of surgery  Before surgery, you can play an important role by reducing the number of germs on your skin.  CHG (Chlorhexidine gluconate) soap is an antiseptic cleanser which kills germs and bonds with the skin to continue killing germs even after washing.  Please do not use if you have an allergy to CHG or antibacterial soaps. If your skin becomes reddened/irritated stop using the CHG.  1. Shower the NIGHT BEFORE SURGERY and the MORNING OF SURGERY with CHG soap.  2. If you choose to wash your hair, wash your hair first as usual with your normal shampoo.  3. After shampooing, rinse your hair and body thoroughly to remove the shampoo.  4. Use CHG as you would any other liquid soap. You can apply CHG directly to  the skin and wash gently with a scrungie or a clean washcloth.  5. Apply the CHG soap to your body only from the neck down. Do not use on open wounds or open sores. Avoid contact with your eyes, ears, mouth, and genitals (private parts). Wash face and genitals (private parts) with your normal soap.  6. Wash thoroughly, paying special attention to the area where your surgery will be performed.  7. Thoroughly rinse your body with warm water.  8. Do not shower/wash with your normal soap after using and rinsing off the CHG soap.  9. Pat yourself dry with a clean towel.  10. Wear clean pajamas to bed the night before surgery.  12. Place clean sheets on your bed the night of your first shower and do not sleep with pets.  13. Shower again with the CHG soap on the day of surgery prior to arriving at the hospital.  14. Do not apply any deodorants/lotions/powders.  15. Please wear clean clothes to the hospital.

## 2023-06-19 ENCOUNTER — Telehealth: Payer: Self-pay | Admitting: Urgent Care

## 2023-06-19 NOTE — Progress Notes (Signed)
  Perioperative Services Pre-Admission/Anesthesia Testing    Date: 06/19/23  Name: [name] MRN:   [id number]  Re: Need for cardiovascular clearance prior to upcoming surgery  Clinical Notes:  Patient is scheduled for a FISTULA SUPERFICIALIZATION (POSSIBLE TRANSPOSITION) on 06/29/2023 with Dr. Levora Dredge, MD.   Given patient's significant cardiovascular history, she was submitted out for cardiovascular clearance from her primary cardiology team at [facility] on 06/18/2023.  Received follow-up communication on 06/19/2023 from clinic staff advising that patient has canceled her last 2 follow-up appointments with [facility] providers.  Notes in their system indicate that patient wants a referral to a local cardiologist at [facility].  Review of my notes from patient's last procedure back earlier this year indicate that patient needs a follow-up functional study to follow-up on her cardiovascular health.  Referral sent to [facility] today requesting new patient appointment to establish care and for preoperative clearance.  Also indicated that patient will need a TTE as part of her evaluation.  I advised on the referral that I can try to set this up through [facility], or alternatively, they can order the study there, with me as the provider, prior to patient being seen if needed.  Requested follow-up communication regarding details of patient appointment for both consultation and repeat TTE.  Awaiting return communication from [facility] at this time.  Will make surgeons office aware.  No changes are being made to the OR schedule at this time.  Hopefully patient can be seen and have her echocardiogram performed prior to current surgical date.  If not, date will need to be postponed pending appropriate cardiovascular evaluation and subsequent clearance.  Orders Placed This Encounter  Procedures   Ambulatory referral to Cardiology    Referral Priority:   Routine    Referral Type:    Consultation    Referral Reason:   Specialty Services Required    Referred to Provider:   Alwyn Pea, MD    Requested Specialty:   Cardiology    Number of Visits Requested:   1   Quentin Mulling, MSN, APRN, FNP-C, CEN [facility]  Perioperative Services Nurse Practitioner Phone: [phone] Fax: [phone] 06/19/23 8:59 AM  NOTE: This note has been prepared using Dragon dictation software. Despite my best ability to proofread, there is always the potential that unintentional transcriptional errors may still occur from this process.

## 2023-06-23 ENCOUNTER — Ambulatory Visit
Admission: RE | Admit: 2023-06-23 | Discharge: 2023-06-23 | Disposition: A | Discharge status: Home / Self Care | Payer: Medicare HMO | Source: Ambulatory Visit | Attending: Neurology | Admitting: Neurology

## 2023-06-23 DIAGNOSIS — R519 Headache, unspecified: Secondary | ICD-10-CM

## 2023-06-28 ENCOUNTER — Encounter: Payer: Self-pay | Admitting: Vascular Surgery

## 2023-06-28 MED ORDER — CEFAZOLIN SODIUM-DEXTROSE 2-4 GM/100ML-% IV SOLN
2.0000 g | INTRAVENOUS | Status: AC
  Administered 2023-06-29: 3 g via INTRAVENOUS

## 2023-06-28 MED ORDER — SODIUM CHLORIDE 0.9 % IV SOLN: INTRAVENOUS | Status: DC

## 2023-06-28 MED ORDER — ORAL CARE MOUTH RINSE: 15.0000 mL | Freq: Once | OROMUCOSAL | Status: AC

## 2023-06-28 MED ORDER — CHLORHEXIDINE GLUCONATE CLOTH 2 % EX PADS: 6.0000 | MEDICATED_PAD | Freq: Once | CUTANEOUS | Status: DC

## 2023-06-28 MED ORDER — CHLORHEXIDINE GLUCONATE 0.12 % MT SOLN
15.0000 mL | Freq: Once | OROMUCOSAL | Status: AC
  Administered 2023-06-29: 15 mL via OROMUCOSAL

## 2023-06-28 MED ORDER — CHLORHEXIDINE GLUCONATE CLOTH 2 % EX PADS
6.0000 | MEDICATED_PAD | Freq: Once | CUTANEOUS | Status: DC
Start: 2023-06-28 — End: 2023-06-29

## 2023-06-28 NOTE — Progress Notes (Signed)
 Perioperative / Anesthesia Services  Pre-Admission Testing Clinical Review / Preoperative Anesthesia Consult  Date: 06/28/23  Patient Demographics:  Name: [name] DOB:   01-27-1971 MRN:   [id number]  Planned Surgical Procedure(s):    Case: [id number] Date/Time: 06/29/23 0715   Procedure: FISTULA SUPERFICIALIZATION ( POSSIBLE TRANSPOSITION) (Left)   Anesthesia type: General   Pre-op diagnosis: ESRD   Location: [facility] OR ROOM 03 / [facility] ORS FOR ANESTHESIA GROUP   Surgeons: Renford Dills, MD     NOTE: Available PAT nursing documentation and vital signs have been reviewed. Clinical nursing staff has updated patient's PMH/PSHx, current medication list, and drug allergies/intolerances to ensure comprehensive history available to assist in medical decision making as it pertains to the aforementioned surgical procedure and anticipated anesthetic course. Extensive review of available clinical information personally performed. [place] PMH and PSHx updated with any diagnoses/procedures that  may have been inadvertently omitted during her intake with the pre-admission testing department's nursing staff.  Clinical Discussion:  [name] is a 53 y.o. female who is submitted for pre-surgical anesthesia review and clearance prior to her undergoing the above procedure. Patient has never been a smoker. Pertinent PMH includes: CAD, NSTEMI, cerebellar stroke, cardiomegaly, exertional angina, HTN, HLD, DOE, OSAH (does not utilize nocturnal PAP therapy), CKD-IV, GERD (on daily PPI), anemia, primary hyperparathyroidism (s/p parathyroidectomy), meningioma, OA, anxiety (on BZO).   Patient previously followed by cardiology at [facility], however voiced desires to transfer care to [facility].  Patient was seen in initial consult by Minda Ditto, PA-C on 06/28/2023; notes reviewed.  At the time of her clinic visit, patient was doing well overall. She was experiencing infrequent palpitations that  seemed to have a link to caffeine intake. Palpations had improved on beta blockers, however not fully revolved.  Patient also with complaints of exertional dyspnea associated with long distance ambulation and climbing stairs. Symptom attributed to weight and physical deconditioning. Patient denied any chest pain, PND, orthopnea, significant peripheral edema, weakness, fatigue, vertiginous symptoms, or presyncope/syncope. Patient with a past medical history significant for cardiovascular diagnoses. Documented physical exam was grossly benign, providing no evidence of acute exacerbation and/or decompensation of the patient's known cardiovascular conditions.  Patient suffered an NSTEMI on 07/29/2016.  Troponins were trended: 2.54 --> 3.02 --> 3.87 --> 3.80 ng/L.  Diagnostic LEFT heart catheterization was performed on 08/01/2016 revealing normal coronary anatomy with no evidence of obstructive coronary artery disease.  CT imaging of the head performed on 07/29/2016 revealed an small old infarct at the posterior RIGHT cerebellum.  Patient with no residual deficits following neurological event.  Myocardial PET CT was performed on 02/28/2021 revealing a normal left ventricular systolic function with an EF of greater than 60%.  Sensitivity and specificity of the test reduced by patient's BMI.  There were no significant coronary carotid calcifications noted on the attenuation correction images.  There was a small, mildly severe, completely reversible perfusion defect involving the basal anterolateral and mid anterolateral segments.  Findings consistent with possible ischemia.  Study determined to be abnormal overall.  Patient underwent diagnostic LEFT heart catheterization on 04/04/2021 revealing normal left ventricular systolic function.  LVEDP elevated at 22 mmHg.  There were no findings of significant coronary artery disease.  Ectasia of the proximal LAD and LCx was noted.  Recommendations were for aggressive  secondary prevention.  Most recent TTE was performed on 11/15/2022 revealing a normal left trickle systolic function with an EF of >55%.  There were no regional wall motion  abnormalities.  Left ventricular diastolic Doppler parameters were normal.  Right ventricular size and function was normal.  There was trivial mitral and tricuspid valve regurgitation.  All transvalvular gradients were noted to be normal providing no evidence suggestive of valvular stenosis.  Aorta normal in size with no evidence of ectasia or aneurysmal dilatation.  Blood pressure well controlled at 100/70 mmHg on currently prescribed CCB (amlodipine), diuretic (furosemide), vasodilator (hydralazine), ARB (losartan) and beta-blocker (metoprolol tartrate) therapies.  Patient is on atorvastatin for her HLD diagnosis and ASCVD prevention. Patient is not diabetic. Patient does have an OSAH diagnosis, however does not utilize nocturnal PAP therapy. Functional capacity limited by obesity and her various medical comorbidities. With that said, patient questionably able to achieve 4 METS of physical activity without, at least to some degree, experiencing angina/anginal equivalent symptoms. No changes were made to her medication regimen during her visit with cardiology.  Patient scheduled to follow-up with outpatient cardiology in 1 year or sooner if needed.  Patient was scheduled for TTE and follow up visit with cardiology on 10/19/2022, however due to issues with transportation, she could not make it to her scheduled appointments. Patient has been rescheduled for the echocardiogram and cardiology follow up in July.   [name] is scheduled for creation of a FISTULA SUPERFICIALIZATION ( POSSIBLE TRANSPOSITION) (Left) on 06/29/2023 with Dr. Levora Dredge, MD.  Given patient's past medical history significant for cardiovascular diagnoses and other multiple medical comorbidities, presurgical clearance was sought from cardiology.  Per  cardiology, "this patient is optimized for surgery and may proceed with the planned procedural course with a ACCEPTABLE risk of significant perioperative cardiovascular complications".  In review of her medication reconciliation, it is noted that patient is currently on prescribed daily antithrombotic therapy.  Given her cardiovascular history, and per standing orders from Dr. Gilda Crease for this particular procedure, patient will continue her daily low dose ASA throughout her perioperative course.  Patient denies previous perioperative complications with anesthesia in the past. In review of the available records, it is noted that patient underwent a general anesthetic course here at [facility] (ASA IV) in 09/2022 without documented complications.      06/18/2023    8:16 AM 05/14/2023    3:03 PM 03/23/2023    3:40 PM  Vitals with BMI  Height 5\' 4"  5\' 4"  5\' 4"   Weight 347 lbs 349 lbs 342 lbs 6 oz  BMI 59.53 59.88 58.74  Systolic 122 162 161  Diastolic 77 86 85  Pulse 89 84 79    Providers/Specialists:   NOTE: Primary physician provider listed below. Patient may have been seen by APP or partner within same practice.   PROVIDER ROLE / SPECIALTY LAST OV  Schnier, Latina Craver, MD Vascular Surgery (Surgeon) 03/23/2023  Shane Crutch, [place] Primary Care Provider 08/07/2022  Marcina Millard, MD Cardiology 06/28/2023  Theresa Mulligan, MD Endocrinology 02/02/2023  Mady Haagensen, MD Nephrology 05/28/2023  Nickolas Madrid, MD Pulmonary Medicine 05/14/2023  Theora Master, MD Neurology 06/11/2023   Allergies:  Hydrochlorothiazide, Latex, Shrimp [shellfish allergy], and Ace inhibitors  Current Home Medications:   No current facility-administered medications for this encounter.    acetaminophen (TYLENOL) 500 MG tablet   amLODipine (NORVASC) 10 MG tablet   aspirin EC 81 MG tablet   atorvastatin (LIPITOR) 40 MG tablet   Cholecalciferol (VITAMIN D3) 50 MCG  (2000 UT) capsule   febuxostat (ULORIC) 40 MG tablet   furosemide (LASIX) 40 MG tablet   gabapentin (  NEURONTIN) 300 MG capsule   hydrALAZINE (APRESOLINE) 50 MG tablet   losartan (COZAAR) 100 MG tablet   metoprolol succinate (TOPROL-XL) 50 MG 24 hr tablet   nortriptyline (PAMELOR) 10 MG capsule   omeprazole (PRILOSEC) 20 MG capsule   History:   Past Medical History:  Diagnosis Date   Anemia    Anginal pain (HCC)    Anxiety    a.) on BZO (diazepam) PRN   Arthritis    Cerebellar stroke (HCC)    a.) CT head 07/29/2016 --> small old infarct at posterior RIGHT cerebellum   Cholelithiasis    CKD (chronic kidney disease), stage IV (HCC)    Diastasis recti    Diastolic dysfunction 07/31/2016   a.) TTE 07/31/2016: EF 65%, mild LV dil, mod MR, G1DD   Diverticulosis    DOE (dyspnea on exertion)    Fibroid uterus    GERD (gastroesophageal reflux disease)    Heart palpitations    History of left heart catheterization 08/01/2016   a.) LHC 08/01/2016 --> normal coronaries; b.) LHC 04/04/2021: normal coronaries   Hypertension    Long term current use of aspirin    Meningioma (HCC) 07/29/2016   a.) CT head 07/29/2016:  11 x 12 x 11 mm at the high posterior RIGHT parietal region; b.) CT head 01/08/2017: stable at 12 mm   Mild cardiomegaly    NSTEMI (non-ST elevated myocardial infarction) (HCC) 07/29/2016   a.) NSTEMI 07/29/2016: troponins trended 2.54 --> 3.02 --> 3.87 --> 3.80 ng/L; b.) LHC 08/01/2016: normal coronaries   OSA (obstructive sleep apnea)    a.) does not utilize nocturnal PAP therapy   Primary hyperparathyroidism (HCC)    a.) s/p parathyroidectomy 06/07/2020; parathyroid adenoma identifed and resected   Umbilical hernia    Past Surgical History:  Procedure Laterality Date   AV FISTULA PLACEMENT Left 10/20/2022   Procedure: ARTERIOVENOUS (AV) FISTULA CREATION ( BRACHIALCEPHALIC);  Surgeon: Renford Dills, MD;  Location: [facility] ORS;  Service: Vascular;  Laterality: Left;    CYSTOSCOPY Bilateral 11/06/2016   Procedure: CYSTOSCOPY with stent placement;  Surgeon: Christeen Douglas, MD;  Location: [facility] ORS;  Service: Gynecology;  Laterality: Bilateral;   HYSTERECTOMY ABDOMINAL WITH SALPINGECTOMY Left 11/06/2016   Procedure: HYSTERECTOMY ABDOMINAL WITH SALPINGECTOMY;  Surgeon: Christeen Douglas, MD;  Location: [facility] ORS;  Service: Gynecology;  Laterality: Left;   LEFT HEART CATH AND CORONARY ANGIOGRAPHY Right 08/01/2016   Procedure: Left Heart Cath and Coronary Angiography;  Surgeon: Laurier Nancy, MD;  Location: [facility] INVASIVE CV LAB;  Service: Cardiovascular;  Laterality: Right;   LEFT HEART CATH AND CORONARY ANGIOGRAPHY Left 04/04/2021   Procedure: LEFT HEART CATH AND CORONARY ANGIOGRAPHY; Location: [facility]; Surgeon: Eppie Gibson, MD   PARATHYROIDECTOMY N/A 06/07/2020   ROTATOR CUFF REPAIR Left    Family History  Problem Relation Age of Onset   CAD Father    Breast cancer Neg Hx    Social History   Tobacco Use   Smoking status: Never   Smokeless tobacco: Never  Substance Use Topics   Alcohol use: No   Drug use: No    Pertinent Clinical Results:  LABS:   No visits with results within 3 Day(s) from this visit.  Latest known visit with results is:  Hospital Outpatient Visit on 06/18/2023  Component Date Value Ref Range Status   ABO/RH(D) 06/18/2023 O POS   Final   Antibody Screen 06/18/2023 POS   Final   Sample Expiration 06/18/2023 07/02/2023,2359   Final   Antibody Identification  06/18/2023 ANTI E   Final   Extend sample reason 06/18/2023    Final                   Value:NO TRANSFUSIONS OR PREGNANCY IN THE PAST 3 MONTHS Performed at [facility], [address] [place], [place] [address]    WBC 06/18/2023 6.0  4.0 - 10.5 K/uL Final   RBC 06/18/2023 4.30  3.87 - 5.11 MIL/uL Final   Hemoglobin 06/18/2023 11.5 (L)  12.0 - 15.0 g/dL Final   HCT 09/81/1914 37.1  36.0 - 46.0 % Final   MCV 06/18/2023 86.3  80.0 - 100.0 fL Final   MCH  06/18/2023 26.7  26.0 - 34.0 pg Final   MCHC 06/18/2023 31.0  30.0 - 36.0 g/dL Final   RDW 78/29/5621 15.4  11.5 - 15.5 % Final   Platelets 06/18/2023 261  150 - 400 K/uL Final   nRBC 06/18/2023 0.0  0.0 - 0.2 % Final   Neutrophils Relative % 06/18/2023 65  % Final   Neutro Abs 06/18/2023 4.0  1.7 - 7.7 K/uL Final   Lymphocytes Relative 06/18/2023 24  % Final   Lymphs Abs 06/18/2023 1.4  0.7 - 4.0 K/uL Final   Monocytes Relative 06/18/2023 8  % Final   Monocytes Absolute 06/18/2023 0.5  0.1 - 1.0 K/uL Final   Eosinophils Relative 06/18/2023 3  % Final   Eosinophils Absolute 06/18/2023 0.2  0.0 - 0.5 K/uL Final   Basophils Relative 06/18/2023 0  % Final   Basophils Absolute 06/18/2023 0.0  0.0 - 0.1 K/uL Final   Immature Granulocytes 06/18/2023 0  % Final   Abs Immature Granulocytes 06/18/2023 0.01  0.00 - 0.07 K/uL Final   Performed at [facility], [address] [place], [place] [address]   Sodium 06/18/2023 139  135 - 145 mmol/L Final   Potassium 06/18/2023 3.5  3.5 - 5.1 mmol/L Final   Chloride 06/18/2023 107  98 - 111 mmol/L Final   CO2 06/18/2023 23  22 - 32 mmol/L Final   Glucose, Bld 06/18/2023 86  70 - 99 mg/dL Final   Glucose reference range applies only to samples taken after fasting for at least 8 hours.   BUN 06/18/2023 54 (H)  6 - 20 mg/dL Final   Creatinine, Ser 06/18/2023 3.05 (H)  0.44 - 1.00 mg/dL Final   Calcium 78/46/9629 9.0  8.9 - 10.3 mg/dL Final   GFR, Estimated 06/18/2023 18 (L)  >60 mL/min Final   Comment: (NOTE) Calculated using the CKD-EPI Creatinine Equation (2021)    Anion gap 06/18/2023 9  5 - 15 Final   Performed at [facility], [address] [place], [place] [address]     ECG: Date: 06/28/2023 Time ECG obtained: 0838 AM Rate: 77 bpm Rhythm:  Sinus rhythm with occasional PVCs Axis (leads I and aVF): Normal Intervals: PR 176 ms. QRS 96 ms. QTc 443 ms. ST segment and T wave changes: Inferolateral T wave inversions in  leads II, III, aVF, and V 3- V 6.  Evidence of prior age undetermined MI: Yes; anterior. Comparison: Inferolateral T wave abnormalities appear to be more pronounced when compared to tracing obtained on 10/10/2022.   IMAGING / PROCEDURES: LONG TERM CARDIAC EVENT MONITOR STUDY performed on 01/29/2023 Predominant underlying rhythm was Sinus Rhythm Patient had a min HR of 53 bpm, max HR of 164 bpm, and av HR of 80 bpm.  1 run of Ventricular Tachycardia occurred lasting 5  beats with a max rate of 133 bpm (av 105 bpm).  9 Supraventricular Tachycardia runs occurred, the run with the fastest interval lasting 5 beats with a max rate of 158 bpm, the longest lasting 11 beats with an av rate of 115 bpm. Isolated SVEs were frequent  (5.7%, [id number]), SVE Couplets were rare (<1.0%, 1099), and SVE Triplets were rare (<1.0%, 72).  Isolated VE's were frequent (13.3%, [id number]), VE Couplets were rare (<1.0%, 5294), and VE Triplets were rare (<1.0%, 314). Ventricular Bigeminy and Trigeminy were present.  Number of Triggered Events: 4 Findings within  45 sec of Triggers:  PAC's, PVCs   TRANSTHORACIC ECHOCARDIOGRAM performed on 11/15/2022 Normal left ventricular systolic function with an EF of >55% % Normal left ventricular diastolic Doppler parameters Normal right ventricular systolic function Trivial mitral and tricuspid valve regurgitation Normal transvalvular gradients; no valvular stenosis No pericardial effusion  LEFT HEART CATHETERIZATION AND CORONARY ANGIOGRAPHY performed on 04/04/2021 Normal left ventricular systolic function with an EF of greater than 55% Elevated LVEDP = 22 mmHg No angiographically significant coronary artery disease Ectasia of the proximal LAD and LCx Recommendations: aggressive secondary prevention  PET/CT MYOCARDIAL PERFUSION MULTIPLE performed on 02/28/2021 Abnormal myocardial perfusion study  There is a small, mildly severe, completely reversible perfusion defect involving the  basal anterolateral and mid anterolateral segments. This is consistent with possible ischemia.  Left ventricular systolic function is normal. Post stress the ejection fraction is > 60%.  Sensitivity and specificity of this test are reduced by the noted increased BMI  No significant coronary calcifications were noted on the attenuation CT   Impression and Plan:  ITALY WARRINER has been referred for pre-anesthesia review and clearance prior to her undergoing the planned anesthetic and procedural courses. Available labs, pertinent testing, and imaging results were personally reviewed by me in preparation for upcoming operative/procedural course. [facility] medical record has been updated following extensive record review and patient interview with PAT staff.   This patient has been appropriately cleared by cardiology ACCEPTABLE with the indicated risk of significant perioperative cardiovascular complications. Based on clinical review performed today (06/28/23), barring any significant acute changes in the patient's overall condition, it is anticipated that she will be able to proceed with the planned surgical intervention. Any acute changes in clinical condition may necessitate her procedure being postponed and/or cancelled. Patient will meet with anesthesia team (MD and/or CRNA) on the day of her procedure for preoperative evaluation/assessment. Questions regarding anesthetic course will be fielded at that time.   Pre-surgical instructions were reviewed with the patient during her PAT appointment, and questions were fielded to satisfaction by PAT clinical staff. She has been instructed on which medications that she will need to hold prior to surgery, as well as the ones that have been deemed safe/appropriate to take on the day of her procedure. As part of the general education provided by PAT, patient made aware both verbally and in writing, that she would need to abstain from the use of any illegal  substances during her perioperative course.  She was advised that failure to follow the provided instructions could necessitate case cancellation or result in serious perioperative complications up to and including death. Patient encouraged to contact PAT and/or her surgeon's office to discuss any questions or concerns that may arise prior to surgery; verbalized understanding.   Quentin Mulling, MSN, APRN, FNP-C, CEN [facility]  Peri-operative Services Nurse Practitioner Phone: [phone] Fax: [phone] 06/28/23 3:27 PM  NOTE: This note  has been prepared using Scientist, clinical (histocompatibility and immunogenetics). Despite my best ability to proofread, there is always the potential that unintentional transcriptional errors may still occur from this process.

## 2023-06-28 NOTE — Progress Notes (Signed)
 Spoke to [facility] cardiology about clearance for this pt for surgery on 06/29/2023. The clearance was faxed and put on pt's chart. Also, spoke to Dr Lorette Ang and he stated to proceed with surgery.

## 2023-06-29 ENCOUNTER — Ambulatory Visit: Payer: Medicare HMO | Admitting: Urgent Care

## 2023-06-29 ENCOUNTER — Ambulatory Visit
Admission: RE | Admit: 2023-06-29 | Discharge: 2023-06-29 | Disposition: A | Discharge status: Home / Self Care | Payer: Medicare HMO | Attending: Vascular Surgery | Admitting: Vascular Surgery

## 2023-06-29 ENCOUNTER — Encounter: Payer: Self-pay | Admitting: Vascular Surgery

## 2023-06-29 ENCOUNTER — Other Ambulatory Visit: Payer: Self-pay

## 2023-06-29 ENCOUNTER — Encounter
Admission: RE | Disposition: A | Discharge status: Home / Self Care | Payer: Self-pay | Source: Home / Self Care | Attending: Vascular Surgery

## 2023-06-29 DIAGNOSIS — Z8673 Personal history of transient ischemic attack (TIA), and cerebral infarction without residual deficits: Secondary | ICD-10-CM | POA: Insufficient documentation

## 2023-06-29 DIAGNOSIS — F419 Anxiety disorder, unspecified: Secondary | ICD-10-CM | POA: Diagnosis not present

## 2023-06-29 DIAGNOSIS — N186 End stage renal disease: Secondary | ICD-10-CM | POA: Insufficient documentation

## 2023-06-29 DIAGNOSIS — Z6841 Body Mass Index (BMI) 40.0 and over, adult: Secondary | ICD-10-CM | POA: Diagnosis not present

## 2023-06-29 DIAGNOSIS — T82898A Other specified complication of vascular prosthetic devices, implants and grafts, initial encounter: Secondary | ICD-10-CM | POA: Diagnosis present

## 2023-06-29 DIAGNOSIS — I5032 Chronic diastolic (congestive) heart failure: Secondary | ICD-10-CM | POA: Diagnosis not present

## 2023-06-29 DIAGNOSIS — Z992 Dependence on renal dialysis: Secondary | ICD-10-CM

## 2023-06-29 DIAGNOSIS — E782 Mixed hyperlipidemia: Secondary | ICD-10-CM | POA: Diagnosis not present

## 2023-06-29 DIAGNOSIS — K219 Gastro-esophageal reflux disease without esophagitis: Secondary | ICD-10-CM | POA: Insufficient documentation

## 2023-06-29 DIAGNOSIS — Y832 Surgical operation with anastomosis, bypass or graft as the cause of abnormal reaction of the patient, or of later complication, without mention of misadventure at the time of the procedure: Secondary | ICD-10-CM | POA: Diagnosis not present

## 2023-06-29 DIAGNOSIS — I252 Old myocardial infarction: Secondary | ICD-10-CM | POA: Insufficient documentation

## 2023-06-29 DIAGNOSIS — I2510 Atherosclerotic heart disease of native coronary artery without angina pectoris: Secondary | ICD-10-CM | POA: Diagnosis not present

## 2023-06-29 DIAGNOSIS — G4733 Obstructive sleep apnea (adult) (pediatric): Secondary | ICD-10-CM | POA: Insufficient documentation

## 2023-06-29 DIAGNOSIS — I132 Hypertensive heart and chronic kidney disease with heart failure and with stage 5 chronic kidney disease, or end stage renal disease: Secondary | ICD-10-CM | POA: Diagnosis not present

## 2023-06-29 DIAGNOSIS — Z79899 Other long term (current) drug therapy: Secondary | ICD-10-CM | POA: Diagnosis not present

## 2023-06-29 DIAGNOSIS — E6689 Other obesity not elsewhere classified: Secondary | ICD-10-CM | POA: Diagnosis not present

## 2023-06-29 DIAGNOSIS — I471 Supraventricular tachycardia, unspecified: Secondary | ICD-10-CM | POA: Diagnosis not present

## 2023-06-29 HISTORY — PX: FISTULA SUPERFICIALIZATION: SHX6341

## 2023-06-29 HISTORY — DX: Supraventricular tachycardia, unspecified: I47.10

## 2023-06-29 SURGERY — FISTULA SUPERFICIALIZATION
Anesthesia: General | Laterality: Left

## 2023-06-29 MED ORDER — OXYCODONE HCL 5 MG PO TABS: 5.0000 mg | ORAL_TABLET | Freq: Once | ORAL | Status: DC | PRN

## 2023-06-29 MED ORDER — FENTANYL CITRATE (PF) 100 MCG/2ML IJ SOLN
INTRAMUSCULAR | Status: DC | PRN
Start: 2023-06-29 — End: 2023-06-29
  Administered 2023-06-29: 100 ug via INTRAVENOUS

## 2023-06-29 MED ORDER — PHENYLEPHRINE HCL-NACL 20-0.9 MG/250ML-% IV SOLN
INTRAVENOUS | Status: AC
  Filled 2023-06-29: qty 250

## 2023-06-29 MED ORDER — FENTANYL CITRATE (PF) 100 MCG/2ML IJ SOLN
INTRAMUSCULAR | Status: AC
  Filled 2023-06-29: qty 2

## 2023-06-29 MED ORDER — ONDANSETRON HCL 4 MG/2ML IJ SOLN
INTRAMUSCULAR | Status: DC | PRN
  Administered 2023-06-29: 4 mg via INTRAVENOUS

## 2023-06-29 MED ORDER — MIDAZOLAM HCL 2 MG/2ML IJ SOLN
INTRAMUSCULAR | Status: AC
  Filled 2023-06-29: qty 2

## 2023-06-29 MED ORDER — SODIUM CHLORIDE 0.9 % IV SOLN
INTRAVENOUS | Status: DC | PRN
  Administered 2023-06-29: 501 mL

## 2023-06-29 MED ORDER — SUCCINYLCHOLINE CHLORIDE 200 MG/10ML IV SOSY
PREFILLED_SYRINGE | INTRAVENOUS | Status: DC | PRN
  Administered 2023-06-29: 200 mg via INTRAVENOUS

## 2023-06-29 MED ORDER — HYDROMORPHONE HCL 1 MG/ML IJ SOLN: 1.0000 mg | Freq: Once | INTRAMUSCULAR | Status: DC | PRN

## 2023-06-29 MED ORDER — LIDOCAINE HCL (CARDIAC) PF 100 MG/5ML IV SOSY
PREFILLED_SYRINGE | INTRAVENOUS | Status: DC | PRN
  Administered 2023-06-29: 100 mg via INTRAVENOUS

## 2023-06-29 MED ORDER — FENTANYL CITRATE (PF) 100 MCG/2ML IJ SOLN: 25.0000 ug | INTRAMUSCULAR | Status: DC | PRN

## 2023-06-29 MED ORDER — BUPIVACAINE LIPOSOME 1.3 % IJ SUSP
INTRAMUSCULAR | Status: AC
  Filled 2023-06-29: qty 20

## 2023-06-29 MED ORDER — BUPIVACAINE LIPOSOME 1.3 % IJ SUSP
INTRAMUSCULAR | Status: DC | PRN
  Administered 2023-06-29: 50 mL

## 2023-06-29 MED ORDER — ONDANSETRON HCL 4 MG/2ML IJ SOLN: 4.0000 mg | Freq: Four times a day (QID) | INTRAMUSCULAR | Status: DC | PRN

## 2023-06-29 MED ORDER — HEPARIN SODIUM (PORCINE) 1000 UNIT/ML IJ SOLN
INTRAMUSCULAR | Status: DC | PRN
  Administered 2023-06-29: 4000 [IU] via INTRAVENOUS

## 2023-06-29 MED ORDER — ROCURONIUM BROMIDE 100 MG/10ML IV SOLN
INTRAVENOUS | Status: DC | PRN
  Administered 2023-06-29: 30 mg via INTRAVENOUS
  Administered 2023-06-29: 20 mg via INTRAVENOUS
  Administered 2023-06-29: 10 mg via INTRAVENOUS

## 2023-06-29 MED ORDER — MIDAZOLAM HCL 2 MG/2ML IJ SOLN
INTRAMUSCULAR | Status: DC | PRN
  Administered 2023-06-29: 2 mg via INTRAVENOUS

## 2023-06-29 MED ORDER — CEFAZOLIN SODIUM-DEXTROSE 2-4 GM/100ML-% IV SOLN
INTRAVENOUS | Status: AC
  Filled 2023-06-29: qty 100

## 2023-06-29 MED ORDER — BUPIVACAINE HCL (PF) 0.5 % IJ SOLN
INTRAMUSCULAR | Status: AC
  Filled 2023-06-29: qty 30

## 2023-06-29 MED ORDER — HEMOSTATIC AGENTS (NO CHARGE) OPTIME
TOPICAL | Status: DC | PRN
  Administered 2023-06-29: 1 via TOPICAL

## 2023-06-29 MED ORDER — PHENYLEPHRINE HCL-NACL 20-0.9 MG/250ML-% IV SOLN
INTRAVENOUS | Status: DC | PRN
  Administered 2023-06-29: 20 ug/min via INTRAVENOUS

## 2023-06-29 MED ORDER — SUGAMMADEX SODIUM 200 MG/2ML IV SOLN
INTRAVENOUS | Status: AC
  Filled 2023-06-29: qty 4

## 2023-06-29 MED ORDER — OXYCODONE-ACETAMINOPHEN 5-325 MG PO TABS: 1.0000 | ORAL_TABLET | Freq: Four times a day (QID) | ORAL | 0 refills | Status: DC | PRN

## 2023-06-29 MED ORDER — SUGAMMADEX SODIUM 200 MG/2ML IV SOLN
INTRAVENOUS | Status: DC | PRN
  Administered 2023-06-29: 400 mg via INTRAVENOUS

## 2023-06-29 MED ORDER — PHENYLEPHRINE HCL (PRESSORS) 10 MG/ML IV SOLN
INTRAVENOUS | Status: DC | PRN
Start: 2023-06-29 — End: 2023-06-29
  Administered 2023-06-29: 80 ug via INTRAVENOUS

## 2023-06-29 MED ORDER — OXYCODONE HCL 5 MG/5ML PO SOLN: 5.0000 mg | Freq: Once | ORAL | Status: DC | PRN

## 2023-06-29 MED ORDER — ONDANSETRON HCL 4 MG/2ML IJ SOLN
INTRAMUSCULAR | Status: AC
  Filled 2023-06-29: qty 2

## 2023-06-29 MED ORDER — HEPARIN SODIUM (PORCINE) 5000 UNIT/ML IJ SOLN
INTRAMUSCULAR | Status: AC
  Filled 2023-06-29: qty 1

## 2023-06-29 MED ORDER — PHENYLEPHRINE 80 MCG/ML (10ML) SYRINGE FOR IV PUSH (FOR BLOOD PRESSURE SUPPORT)
PREFILLED_SYRINGE | INTRAVENOUS | Status: AC
  Filled 2023-06-29: qty 10

## 2023-06-29 MED ORDER — PROPOFOL 10 MG/ML IV BOLUS
INTRAVENOUS | Status: AC
  Filled 2023-06-29: qty 20

## 2023-06-29 MED ORDER — PROPOFOL 10 MG/ML IV BOLUS
INTRAVENOUS | Status: DC | PRN
  Administered 2023-06-29: 100 mg via INTRAVENOUS

## 2023-06-29 MED ORDER — CHLORHEXIDINE GLUCONATE 0.12 % MT SOLN
OROMUCOSAL | Status: AC
  Filled 2023-06-29: qty 15

## 2023-06-29 SURGICAL SUPPLY — 42 items
BAG DECANTER FOR FLEXI CONT (MISCELLANEOUS) IMPLANT
BLADE SURG 15 STRL LF DISP TIS (BLADE) ×1 IMPLANT
BLADE SURG SZ11 CARB STEEL (BLADE) ×1 IMPLANT
BRUSH SCRUB EZ 4% CHG (MISCELLANEOUS) ×1 IMPLANT
CHLORAPREP W/TINT 26 (MISCELLANEOUS) IMPLANT
CLAMP SUTURE YELLOW 5 PAIRS (MISCELLANEOUS) ×1 IMPLANT
DERMABOND ADVANCED .7 DNX12 (GAUZE/BANDAGES/DRESSINGS) ×1 IMPLANT
DRESSING SURGICEL FIBRLLR 1X2 (HEMOSTASIS) ×1 IMPLANT
DRSG SURGICEL FIBRILLAR 1X2 (HEMOSTASIS) ×1 IMPLANT
ELECT CAUTERY BLADE 6.4 (BLADE) ×1 IMPLANT
ELECT REM PT RETURN 9FT ADLT (ELECTROSURGICAL) ×1 IMPLANT
ELECTRODE REM PT RTRN 9FT ADLT (ELECTROSURGICAL) ×1 IMPLANT
GEL ULTRASOUND 20GR AQUASONIC (MISCELLANEOUS) IMPLANT
GLOVE BIO SURGEON STRL SZ7 (GLOVE) ×1 IMPLANT
GLOVE SURG SYN 8.0 (GLOVE) ×1 IMPLANT
GLOVE SURG SYN 8.0 PF PI (GLOVE) ×1 IMPLANT
GOWN STRL REUS W/ TWL LRG LVL3 (GOWN DISPOSABLE) ×2 IMPLANT
GOWN STRL REUS W/ TWL XL LVL3 (GOWN DISPOSABLE) ×1 IMPLANT
GOWN STRL REUS W/TWL XL LVL4 (GOWN DISPOSABLE) ×1 IMPLANT
IV NS 500ML BAXH (IV SOLUTION) ×1 IMPLANT
KIT TURNOVER KIT A (KITS) ×1 IMPLANT
LOOP VESSEL MAXI 1X406 RED (MISCELLANEOUS) ×1 IMPLANT
LOOP VESSEL MINI 0.8X406 BLUE (MISCELLANEOUS) ×2 IMPLANT
MANIFOLD NEPTUNE II (INSTRUMENTS) ×1 IMPLANT
NDL FILTER BLUNT 18X1 1/2 (NEEDLE) ×1 IMPLANT
NEEDLE FILTER BLUNT 18X1 1/2 (NEEDLE) ×1 IMPLANT
PACK EXTREMITY ARMC (MISCELLANEOUS) ×1 IMPLANT
PAD PREP OB/GYN DISP 24X41 (PERSONAL CARE ITEMS) ×1 IMPLANT
STOCKINETTE 48X4 2 PLY STRL (GAUZE/BANDAGES/DRESSINGS) ×1 IMPLANT
STOCKINETTE ORTHO 4X25 (MISCELLANEOUS) ×1 IMPLANT
STOCKINETTE STRL 4IN 9604848 (GAUZE/BANDAGES/DRESSINGS) ×1 IMPLANT
SUT MNCRL+ 5-0 UNDYED PC-3 (SUTURE) ×2 IMPLANT
SUT PROLENE 6 0 BV (SUTURE) ×4 IMPLANT
SUT SILK 2 0 SH (SUTURE) ×1 IMPLANT
SUT SILK 2-0 18XBRD TIE 12 (SUTURE) ×1 IMPLANT
SUT SILK 3-0 18XBRD TIE 12 (SUTURE) ×1 IMPLANT
SUT SILK 4-0 18XBRD TIE 12 (SUTURE) IMPLANT
SUT VIC AB 3-0 SH 27X BRD (SUTURE) ×3 IMPLANT
SYR 20ML LL LF (SYRINGE) ×1 IMPLANT
SYR 3ML LL SCALE MARK (SYRINGE) ×1 IMPLANT
TAG SUTURE CLAMP YLW 5PR (MISCELLANEOUS) ×1 IMPLANT
TRAP FLUID SMOKE EVACUATOR (MISCELLANEOUS) ×1 IMPLANT

## 2023-06-29 NOTE — Progress Notes (Signed)
 Patient will call, from home, to make post-op follow up appt.

## 2023-06-29 NOTE — Op Note (Signed)
 OPERATIVE NOTE    PROCEDURE: 1.  Revision left brachial cephalic fistula to a brachial axillary Artegraft 2.  Resection of a 2 cm kinked segment of cephalic vein with primary end-to-end anastomosis.     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  In the upper arm there is a significant kink with narrowing as described in the ultrasound.  Depth is also confirmed as described in the ultrasound.  SPECIMEN(S): The resected aneurysmal fistula en bloc to pathology  INDICATIONS:   [name] is a 53 y.o. female who presents with stage IV renal insufficiency.  Left cephalic vein is kinked in the mid upper arm.  The patient is noted to have a fistula that is approximately 3-1/2 to 4 cm in depth because of the large size of her arm and therefore it would not be accessible for access.  Based on these 2 issues her left arm fistula is being revised.  Risks and benefits have been reviewed all questions have been answered patient agrees to proceedThe patient is aware of the risks of the procedure and elects to proceed forward.    DESCRIPTION: After full informed written consent was obtained from the patient, the patient was brought back to the operating room and placed supine upon the operating table.  Prior to induction, the patient received IV antibiotics.   After obtaining adequate anesthesia, the patient was then prepped and draped in the standard fashion for a left arm access procedure.   The cephalic vein is mapped with ultrasound prior to beginning the surgery.  A linear incision was then created over the fistula exposing the vein.  The  dissection was carried down through the skin and the soft fatty tissues to expose the aneurysmal vein itself.  Dissection was carried from the level of the brachial artery up to the proximal cephalic vein at the level of the mid deltoid muscle.  Once the cephalic vein is dissected circumferentially several venous tributaries are exposed.  Several venous tributaries were encountered and these were ligated with 2-0 and 3-0 silk ties and divided.   Having dissected the cephalic vein from the level of the antecubital fossa up to the shoulder circumferentially.  The kink is now fully exposed I determined that this will require resection with primary end-to-end anastomosis.  The cephalic vein is marked with a surgical marker both anteriorly and laterally so that it can be lined up.  4000 units of heparin was given allowed to circulate for 5 minutes.  The fistula was then clamped proximally distally and transected.  A segment of approximately 2 2 and half centimeters is removed.  A end-to-end anastomosis was then fashioned using interrupted 6-0 Prolene's in a four-quadrant technique.  Flushing maneuvers were performed and flow was reestablished through the fistula.  Excellent thrill is noted the redundancy has been removed and the kink has been resected.  The subcutaneous tissues were then reapproximated below the vein elevating it toward the skin surface.  Interrupted 3-0 Vicryl subdermal stitches were placed to line the skin up.  Exparel is infiltrated into the soft tissues.  The skin is then closed with a running 4-0 Monocryl suture.   The skin was  then cleaned, dried, and reinforced with Dermabond.   The patient tolerated this procedure well.    COMPLICATIONS: None   CONDITION: [name]  Office: [phone]   06/29/2023, 10:26 AM

## 2023-06-29 NOTE — Transfer of Care (Signed)
 Immediate Anesthesia Transfer of Care Note  Patient: [name]  Procedure(s) Performed: FISTULA SUPERFICIALIZATION ( POSSIBLE TRANSPOSITION) (Left)  Patient Location: PACU  Anesthesia Type:General  Level of Consciousness: awake, alert , and oriented  Airway & Oxygen Therapy: Patient Spontanous Breathing and Patient connected to face mask oxygen  Post-op Assessment: Report given to RN and Post -op Vital signs reviewed and stable  Post vital signs: stable  Last Vitals:  Vitals Value Taken Time  BP 130/71 06/29/23 1019  Temp 36.1 C 06/29/23 1019  Pulse 69 06/29/23 1021  Resp 10 06/29/23 1021  SpO2 100 % 06/29/23 1021  Vitals shown include unfiled device data.  Last Pain:  Vitals:   06/29/23 1019  TempSrc:   PainSc: 0-No pain         Complications: No notable events documented.

## 2023-06-29 NOTE — Anesthesia Procedure Notes (Signed)
 Procedure Name: Intubation Date/Time: 06/29/2023 7:46 AM  Performed by: Rodney Booze, CRNAPre-anesthesia Checklist: Patient identified, Emergency Drugs available, Suction available and Patient being monitored Patient Re-evaluated:Patient Re-evaluated prior to induction Oxygen Delivery Method: Circle system utilized Preoxygenation: Pre-oxygenation with 100% oxygen Induction Type: IV induction Ventilation: Mask ventilation without difficulty Laryngoscope Size: McGrath and 3 Grade View: Grade I Tube type: Oral Tube size: 7.0 mm Number of attempts: 1 Airway Equipment and Method: Stylet and Oral airway Placement Confirmation: ETT inserted through vocal cords under direct vision, positive ETCO2 and breath sounds checked- equal and bilateral Secured at: 22 cm Tube secured with: Tape Dental Injury: Teeth and Oropharynx as per pre-operative assessment  Comments: Patient has two very precarious teeth on bottom right. They are barely hanging on and they are as intact post-intubation as they were pre-intubation. Pt self reported that they are loose pre-op

## 2023-06-29 NOTE — H&P (View-Only) (Signed)
 MRN : [id number]  [name] is a 53 y.o. (06-22-1970) female who presents with chief complaint of check access.  History of Present Illness:   Patient presents to [facility] today for revision/superficialization of her existing AV fistula.  She was last seen in the office in November 2024.  There have been no intervening issues with her access.  This access was placed on 10/20/2022. 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 She does endorse having some cramping in her arm but none that is specifically located in her hand per se. She still continues to work. Her numbers have remained fairly stable and patient is not yet on dialysis.   Duplex ultrasound of the AV fistula performed March 09, 2023 demonstrates a patent fistula with a flow volume of 2026 cc/min.  There does appear to be a velocity shift in the mid upper arm with velocities that increased from 97-410.   Current Meds  Medication Sig   acetaminophen (TYLENOL) 500 MG tablet Take 2 tablets (1,000 mg total) by mouth every 6 (six) hours.   amLODipine (NORVASC) 10 MG tablet Take 10 mg by mouth in the morning.   aspirin EC 81 MG tablet Take 81 mg by mouth in the morning. Swallow whole.   atorvastatin (LIPITOR) 40 MG tablet Take 1 tablet (40 mg total) by mouth daily at 6 PM. (Patient taking differently: Take 40 mg by mouth at bedtime.)   Cholecalciferol (VITAMIN D3) 50 MCG (2000 UT) capsule Take 2,000 Units by mouth in the morning.   febuxostat (ULORIC) 40 MG tablet Take 40 mg by mouth in the morning.   furosemide (LASIX) 40 MG tablet Take 40 mg by mouth in the morning.   gabapentin (NEURONTIN) 300 MG capsule TAKE 1 CAPSULE BY MOUTH THREE TIMES DAILY MAY CAUSE DROWSINESS   hydrALAZINE (APRESOLINE) 50 MG tablet Take 50 mg by mouth 3 (three) times daily.   losartan (COZAAR) 100 MG tablet Take 100 mg by mouth in the morning.   metoprolol succinate  (TOPROL-XL) 50 MG 24 hr tablet Take 50 mg by mouth in the morning.   nortriptyline (PAMELOR) 10 MG capsule Take 20 mg by mouth at bedtime.   omeprazole (PRILOSEC) 20 MG capsule Take 20 mg by mouth daily before breakfast.    Past Medical History:  Diagnosis Date   Anemia    Anginal pain (HCC)    Anxiety    a.) on BZO (diazepam) PRN   Arthritis    Cerebellar stroke (HCC)    a.) CT head 07/29/2016 --> small old infarct at posterior RIGHT cerebellum   Cholelithiasis    CKD (chronic kidney disease), stage IV (HCC)    Diastasis recti    Diastolic dysfunction 07/31/2016   a.) TTE 07/31/2016: EF 65%, mild LV dil, mod MR, G1DD   Diverticulosis    DOE (dyspnea on exertion)    Fibroid uterus    GERD (gastroesophageal reflux disease)    Heart palpitations    History of left heart catheterization 08/01/2016   a.) LHC 08/01/2016 --> normal coronaries; b.) LHC 04/04/2021: normal coronaries   Hypertension    Long term current use of aspirin    Meningioma (HCC) 07/29/2016   a.) CT head 07/29/2016:  11 x 12 x 11 mm at the high posterior RIGHT parietal region; b.) CT head 01/08/2017: stable at  12 mm   Mild cardiomegaly    NSTEMI (non-ST elevated myocardial infarction) (HCC) 07/29/2016   a.) NSTEMI 07/29/2016: troponins trended 2.54 --> 3.02 --> 3.87 --> 3.80 ng/L; b.) LHC 08/01/2016: normal coronaries   OSA (obstructive sleep apnea)    a.) does not utilize nocturnal PAP therapy   Primary hyperparathyroidism (HCC)    a.) s/p parathyroidectomy 06/07/2020; parathyroid adenoma identifed and resected   PSVT (paroxysmal supraventricular tachycardia) (HCC)    a.) holter 01/29/2023: 9 runs with the fastest lasting 5 beat   Umbilical hernia     Past Surgical History:  Procedure Laterality Date   AV FISTULA PLACEMENT Left 10/20/2022   Procedure: ARTERIOVENOUS (AV) FISTULA CREATION ( BRACHIALCEPHALIC);  Surgeon: Renford Dills, MD;  Location: [facility] OR[facility];  Service: Vascular;  Laterality: Left;    CYSTOSCOPY Bilateral 11/06/2016   Procedure: CYSTOSCOPY with stent placement;  Surgeon: Christeen Douglas, MD;  Location: [facility];  Service: Gynecology;  Laterality: Bilateral;   HYSTERECTOMY ABDOMINAL WITH SALPINGECTOMY Left 11/06/2016   Procedure: HYSTERECTOMY ABDOMINAL WITH SALPINGECTOMY;  Surgeon: Christeen Douglas, MD;  Location: [facility];  Service: Gynecology;  Laterality: Left;   LEFT HEART CATH AND CORONARY ANGIOGRAPHY Right 08/01/2016   Procedure: Left Heart Cath and Coronary Angiography;  Surgeon: Laurier Nancy, MD;  Location: [facility];  Service: Cardiovascular;  Laterality: Right;   LEFT HEART CATH AND CORONARY ANGIOGRAPHY Left 04/04/2021   Procedure: LEFT HEART CATH AND CORONARY ANGIOGRAPHY; Location: [facility]; Surgeon: Eppie Gibson, MD   PARATHYROIDECTOMY N/A 06/07/2020   ROTATOR CUFF REPAIR Left     Social History Social History   Tobacco Use   Smoking status: Never   Smokeless tobacco: Never  Substance Use Topics   Alcohol use: No   Drug use: No    Family History Family History  Problem Relation Age of Onset   CAD Father    Breast cancer Neg Hx     Allergies  Allergen Reactions   Hydrochlorothiazide Cough   Latex Swelling and Rash    Other reaction(s): Unknown   Shrimp [Shellfish Allergy] Anaphylaxis   Ace Inhibitors Cough     REVIEW OF SYSTEMS (Negative unless checked)  Constitutional: [] Weight loss  [] Fever  [] Chills Cardiac: [] Chest pain   [] Chest pressure   [] Palpitations   [] Shortness of breath when laying flat   [] Shortness of breath with exertion. Vascular:  [] Pain in legs with walking   [] Pain in legs at rest  [] History of DVT   [] Phlebitis   [] Swelling in legs   [] Varicose veins   [] Non-healing ulcers Pulmonary:   [] Uses home oxygen   [] Productive cough   [] Hemoptysis   [] Wheeze  [] COPD   [] Asthma Neurologic:  [] Dizziness   [] Seizures   [] History of stroke   [] History of TIA  [] Aphasia   [] Vissual changes   [] Weakness or numbness in arm    [] Weakness or numbness in leg Musculoskeletal:   [] Joint swelling   [] Joint pain   [] Low back pain Hematologic:  [] Easy bruising  [] Easy bleeding   [] Hypercoagulable state   [] Anemic Gastrointestinal:  [] Diarrhea   [] Vomiting  [] Gastroesophageal reflux/heartburn   [] Difficulty swallowing. Genitourinary:  [x] Chronic kidney disease   [] Difficult urination  [] Frequent urination   [] Blood in urine Skin:  [] Rashes   [] Ulcers  Psychological:  [] History of anxiety   []  History of major depression.  Physical Examination  Vitals:   06/29/23 0627  BP: 137/82  Pulse: 86  Resp: 18  Temp: (!) 97.4 F (36.3 C)  TempSrc: Tympanic  SpO2: 97%  Weight: (!) 158.3 kg  Height: 5\' 4"  (1.626 m)   Body mass index is 59.91 kg/m. Gen: WD/WN, NAD Head: [place]/AT, No temporalis wasting.  Ear/Nose/Throat: Hearing grossly intact, nares w/o erythema or drainage Eyes: PER, EOMI, sclera nonicteric.  Neck: Supple, no gross masses or lesions.  No JVD.  Pulmonary:  Good air movement, no audible wheezing, no use of accessory muscles.  Cardiac: RRR, precordium non-hyperdynamic. Vascular:   Left arm brachiocephalic fistula with positive thrill positive bruit Vessel Right Left  Radial Palpable Palpable  Brachial Palpable Palpable  Gastrointestinal: soft, non-distended. No guarding/no peritoneal signs.  Musculoskeletal: M/S 5/5 throughout.  No deformity.  Neurologic: CN 2-12 intact. Pain and light touch intact in extremities.  Symmetrical.  Speech is fluent. Motor exam as listed above. Psychiatric: Judgment intact, Mood & affect appropriate for pt's clinical situation. Dermatologic: No rashes or ulcers noted.  No changes consistent with cellulitis.   CBC Lab Results  Component Value Date   WBC 6.0 06/18/2023   HGB 11.5 (L) 06/18/2023   HCT 37.1 06/18/2023   MCV 86.3 06/18/2023   PLT 261 06/18/2023    BMET    Component Value Date/Time   NA 139 06/18/2023 0855   K 3.5 06/18/2023 0855   CL 107 06/18/2023  0855   CO2 23 06/18/2023 0855   GLUCOSE 86 06/18/2023 0855   BUN 54 (H) 06/18/2023 0855   CREATININE 3.05 (H) 06/18/2023 0855   CALCIUM 9.0 06/18/2023 0855   CALCIUM 9.4 12/21/2020 1145   GFRNONAA 18 (L) 06/18/2023 0855   GFRAA 37 (L) 07/04/2019 1109   Estimated Creatinine Clearance: 32.7 mL/min (A) (by C-G formula based on SCr of 3.05 mg/dL (H)).  COAG Lab Results  Component Value Date   INR 1.00 01/08/2017   INR 0.95 08/01/2016   INR 0.89 07/29/2016    Radiology No results found.   Assessment/Plan 1. Stage 4 chronic kidney disease (HCC) Recommend:  At this time the patient does not have appropriate extremity access for dialysis.  Although her brachiocephalic fistula is patent with a good flow volume it is relatively deep beneath the skin and therefore not acceptable for access, as a second issue there is also a velocity shift in the mid upper arm that will need to be addressed likely a retained valve or kink.  Patient should have a her left arm brachiocephalic fistula revised and superficialized.  The risks, benefits and alternative therapies were reviewed in detail with the patient.  All questions were answered.  The patient agrees to proceed with surgery.   The patient will follow up with me in the office after the surgery.   2. NSTEMI (non-ST elevated myocardial infarction) [facility]) The patient has received cardiac clearance  Continue cardiac and antihypertensive medications as already ordered and reviewed, no changes at this time.    Continue statin as ordered and reviewed, no changes at this time   3. Essential hypertension Continue antihypertensive medications as already ordered, these medications have been reviewed and there are no changes at this time.   4. Gastroesophageal reflux disease without esophagitis Continue PPI as already ordered, this medication has been reviewed and there are no changes at this time.   Avoidence of caffeine and alcohol   Moderate  elevation of the head of the bed    5. Mixed hyperlipidemia Continue statin as ordered and reviewed, no changes at this time   Levora Dredge, MD  06/29/2023 6:47 AM

## 2023-06-29 NOTE — Anesthesia Preprocedure Evaluation (Addendum)
 Anesthesia Evaluation  Patient identified by MRN, date of birth, ID band Patient awake  General Assessment Comment:Pt had new ST changes on EKG in preop clinic, sent to cardiology for preop evaluation and clearance. Per [facility] Baron Sane, PA cardiology "-Proceed with fistula revision with no restriction"  Reviewed: Allergy & Precautions, NPO status , Patient's Chart, lab work & pertinent test results  History of Anesthesia Complications Negative for: history of anesthetic complications  Airway Mallampati: III  TM Distance: >3 FB Neck ROM: full    Dental  (+) Poor Dentition, Missing   Pulmonary sleep apnea     + wheezing      Cardiovascular hypertension, On Medications (-) angina + CAD, + Past MI, +CHF and + DOE  Normal cardiovascular exam+ dysrhythmias Supra Ventricular Tachycardia   EKG 2/17  Sinus rhythm with occasional Premature ventricular complexes Moderate voltage criteria for LVH, may be normal variant ( R in aVL , Cornell product ) Possible Anterior infarct , age undetermined T wave abnormality, consider inferolateral ischemia Abnormal ECG When compared with ECG of 10-Oct-2022 10:07, Borderline criteria for Anterior infarct are now Present T wave inversion now evident in Anterolateral leads Confirmed by Windell Norfolk [phone]) on 06/19/2023 12:48:16 PM   Neuro/Psych  PSYCHIATRIC DISORDERS Anxiety      Neuromuscular disease CVA    GI/Hepatic Neg liver ROS,GERD  ,,  Endo/Other    Class 4 obesity  Renal/GU Renal disease     Musculoskeletal  (+) Arthritis ,    Abdominal   Peds  Hematology  (+) Blood dyscrasia, anemia   Anesthesia Other Findings Past Medical History: No date: Anemia No date: Anginal pain (HCC) No date: Anxiety     Comment:  a.) on BZO (diazepam) PRN No date: Arthritis No date: Cerebellar stroke (HCC)     Comment:  a.) CT head 07/29/2016 --> small old infarct at               posterior  RIGHT cerebellum No date: Cholelithiasis No date: CKD (chronic kidney disease), stage IV (HCC) No date: Diastasis recti 07/31/2016: Diastolic dysfunction     Comment:  a.) TTE 07/31/2016: EF 65%, mild LV dil, mod MR, G1DD No date: Diverticulosis No date: DOE (dyspnea on exertion) No date: Fibroid uterus No date: GERD (gastroesophageal reflux disease) No date: Heart palpitations 08/01/2016: History of left heart catheterization     Comment:  a.) LHC 08/01/2016 --> normal coronaries; b.) LHC               04/04/2021: normal coronaries No date: Hypertension No date: Long term current use of aspirin 07/29/2016: Meningioma (HCC)     Comment:  a.) CT head 07/29/2016:  11 x 12 x 11 mm at the high               posterior RIGHT parietal region; b.) CT head 01/08/2017:               stable at 12 mm No date: Mild cardiomegaly 07/29/2016: NSTEMI (non-ST elevated myocardial infarction) [facility])     Comment:  a.) NSTEMI 07/29/2016: troponins trended 2.54 --> 3.02               --> 3.87 --> 3.80 ng/L; b.) LHC 08/01/2016: normal               coronaries No date: OSA (obstructive sleep apnea)     Comment:  a.) does not utilize nocturnal PAP therapy No date: Primary hyperparathyroidism (HCC)  Comment:  a.) s/p parathyroidectomy 06/07/2020; parathyroid               adenoma identifed and resected No date: PSVT (paroxysmal supraventricular tachycardia) (HCC)     Comment:  a.) holter 01/29/2023: 9 runs with the fastest lasting 5              beat No date: Umbilical hernia  Past Surgical History: 10/20/2022: AV FISTULA PLACEMENT; Left     Comment:  Procedure: ARTERIOVENOUS (AV) FISTULA CREATION (               BRACHIALCEPHALIC);  Surgeon: Renford Dills, MD;                Location: [facility] ORS;  Service: Vascular;  Laterality:               Left; 11/06/2016: CYSTOSCOPY; Bilateral     Comment:  Procedure: CYSTOSCOPY with stent placement;  Surgeon:               Christeen Douglas, MD;  Location:  [facility] OR[facility];  Service:               Gynecology;  Laterality: Bilateral; 11/06/2016: HYSTERECTOMY ABDOMINAL WITH SALPINGECTOMY; Left     Comment:  Procedure: HYSTERECTOMY ABDOMINAL WITH SALPINGECTOMY;                Surgeon: Christeen Douglas, MD;  Location: [facility] ORS;                Service: Gynecology;  Laterality: Left; 08/01/2016: LEFT HEART CATH AND CORONARY ANGIOGRAPHY; Right     Comment:  Procedure: Left Heart Cath and Coronary Angiography;                Surgeon: Laurier Nancy, MD;  Location: [facility] INVASIVE CV               LAB;  Service: Cardiovascular;  Laterality: Right; 04/04/2021: LEFT HEART CATH AND CORONARY ANGIOGRAPHY; Left     Comment:  Procedure: LEFT HEART CATH AND CORONARY ANGIOGRAPHY;               Location: [facility]; Surgeon: Eppie Gibson, MD 06/07/2020: PARATHYROIDECTOMY; N/A No date: ROTATOR CUFF REPAIR; Left  BMI    Body Mass Index: 59.91 kg/m      Reproductive/Obstetrics negative OB ROS                             Anesthesia Physical Anesthesia Plan  ASA: 4  Anesthesia Plan: General ETT   Post-op Pain Management: Ofirmev IV (intra-op)*   Induction: Intravenous  PONV Risk Score and Plan: 3 and Ondansetron, Dexamethasone, Midazolam and Treatment may vary due to age or medical condition  Airway Management Planned: Oral ETT  Additional Equipment:   Intra-op Plan:   Post-operative Plan: Extubation in OR  Informed Consent: I have reviewed the patients History and Physical, chart, labs and discussed the procedure including the risks, benefits and alternatives for the proposed anesthesia with the patient or authorized representative who has indicated his/her understanding and acceptance.     Dental Advisory Given  Plan Discussed with: Anesthesiologist, CRNA and Surgeon  Anesthesia Plan Comments: (Patient consented for risks of anesthesia including but not limited to:  - adverse reactions to medications - damage to eyes, teeth,  lips or other oral mucosa - nerve damage due to positioning  - sore throat or hoarseness - Damage to heart, brain, nerves, lungs, other  parts of body or loss of life  Patient voiced understanding and assent.)        Anesthesia Quick Evaluation

## 2023-06-29 NOTE — Interval H&P Note (Signed)
 History and Physical Interval Note:  06/29/2023 6:56 AM  [name]  has presented today for surgery, with the diagnosis of ESRD.  The various methods of treatment have been discussed with the patient and family. After consideration of risks, benefits and other options for treatment, the patient has consented to  Procedure(s): FISTULA SUPERFICIALIZATION ( POSSIBLE TRANSPOSITION) (Left) as a surgical intervention.  The patient's history has been reviewed, patient examined, no change in status, stable for surgery.  I have reviewed the patient's chart and labs.  Questions were answered to the patient's satisfaction.     Levora Dredge

## 2023-06-29 NOTE — Anesthesia Postprocedure Evaluation (Signed)
 Anesthesia Post Note  Patient: [name]  Procedure(s) Performed: FISTULA SUPERFICIALIZATION ( POSSIBLE TRANSPOSITION) (Left)  Patient location during evaluation: PACU Anesthesia Type: General Level of consciousness: awake and alert Pain management: pain level controlled Vital Signs Assessment: post-procedure vital signs reviewed and stable Respiratory status: spontaneous breathing, nonlabored ventilation, respiratory function stable and patient connected to nasal cannula oxygen Cardiovascular status: blood pressure returned to baseline and stable Postop Assessment: no apparent nausea or vomiting Anesthetic complications: no   No notable events documented.   Last Vitals:  Vitals:   06/29/23 1045 06/29/23 1052  BP: 126/75   Pulse: 70 70  Resp: 12 16  Temp:  (!) 36.1 C  SpO2: 95% 97%    Last Pain:  Vitals:   06/29/23 1045  TempSrc:   PainSc: 0-No pain                 Louie Boston

## 2023-06-29 NOTE — Progress Notes (Signed)
 MRN : [id number]  [name] is a 53 y.o. (06-22-1970) female who presents with chief complaint of check access.  History of Present Illness:   Patient presents to [facility] today for revision/superficialization of her existing AV fistula.  She was last seen in the office in November 2024.  There have been no intervening issues with her access.  This access was placed on 10/20/2022. 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 She does endorse having some cramping in her arm but none that is specifically located in her hand per se. She still continues to work. Her numbers have remained fairly stable and patient is not yet on dialysis.   Duplex ultrasound of the AV fistula performed March 09, 2023 demonstrates a patent fistula with a flow volume of 2026 cc/min.  There does appear to be a velocity shift in the mid upper arm with velocities that increased from 97-410.   Current Meds  Medication Sig   acetaminophen (TYLENOL) 500 MG tablet Take 2 tablets (1,000 mg total) by mouth every 6 (six) hours.   amLODipine (NORVASC) 10 MG tablet Take 10 mg by mouth in the morning.   aspirin EC 81 MG tablet Take 81 mg by mouth in the morning. Swallow whole.   atorvastatin (LIPITOR) 40 MG tablet Take 1 tablet (40 mg total) by mouth daily at 6 PM. (Patient taking differently: Take 40 mg by mouth at bedtime.)   Cholecalciferol (VITAMIN D3) 50 MCG (2000 UT) capsule Take 2,000 Units by mouth in the morning.   febuxostat (ULORIC) 40 MG tablet Take 40 mg by mouth in the morning.   furosemide (LASIX) 40 MG tablet Take 40 mg by mouth in the morning.   gabapentin (NEURONTIN) 300 MG capsule TAKE 1 CAPSULE BY MOUTH THREE TIMES DAILY MAY CAUSE DROWSINESS   hydrALAZINE (APRESOLINE) 50 MG tablet Take 50 mg by mouth 3 (three) times daily.   losartan (COZAAR) 100 MG tablet Take 100 mg by mouth in the morning.   metoprolol succinate  (TOPROL-XL) 50 MG 24 hr tablet Take 50 mg by mouth in the morning.   nortriptyline (PAMELOR) 10 MG capsule Take 20 mg by mouth at bedtime.   omeprazole (PRILOSEC) 20 MG capsule Take 20 mg by mouth daily before breakfast.    Past Medical History:  Diagnosis Date   Anemia    Anginal pain (HCC)    Anxiety    a.) on BZO (diazepam) PRN   Arthritis    Cerebellar stroke (HCC)    a.) CT head 07/29/2016 --> small old infarct at posterior RIGHT cerebellum   Cholelithiasis    CKD (chronic kidney disease), stage IV (HCC)    Diastasis recti    Diastolic dysfunction 07/31/2016   a.) TTE 07/31/2016: EF 65%, mild LV dil, mod MR, G1DD   Diverticulosis    DOE (dyspnea on exertion)    Fibroid uterus    GERD (gastroesophageal reflux disease)    Heart palpitations    History of left heart catheterization 08/01/2016   a.) LHC 08/01/2016 --> normal coronaries; b.) LHC 04/04/2021: normal coronaries   Hypertension    Long term current use of aspirin    Meningioma (HCC) 07/29/2016   a.) CT head 07/29/2016:  11 x 12 x 11 mm at the high posterior RIGHT parietal region; b.) CT head 01/08/2017: stable at  12 mm   Mild cardiomegaly    NSTEMI (non-ST elevated myocardial infarction) (HCC) 07/29/2016   a.) NSTEMI 07/29/2016: troponins trended 2.54 --> 3.02 --> 3.87 --> 3.80 ng/L; b.) LHC 08/01/2016: normal coronaries   OSA (obstructive sleep apnea)    a.) does not utilize nocturnal PAP therapy   Primary hyperparathyroidism (HCC)    a.) s/p parathyroidectomy 06/07/2020; parathyroid adenoma identifed and resected   PSVT (paroxysmal supraventricular tachycardia) (HCC)    a.) holter 01/29/2023: 9 runs with the fastest lasting 5 beat   Umbilical hernia     Past Surgical History:  Procedure Laterality Date   AV FISTULA PLACEMENT Left 10/20/2022   Procedure: ARTERIOVENOUS (AV) FISTULA CREATION ( BRACHIALCEPHALIC);  Surgeon: Renford Dills, MD;  Location: [facility] ORS;  Service: Vascular;  Laterality: Left;    CYSTOSCOPY Bilateral 11/06/2016   Procedure: CYSTOSCOPY with stent placement;  Surgeon: Christeen Douglas, MD;  Location: [facility] ORS;  Service: Gynecology;  Laterality: Bilateral;   HYSTERECTOMY ABDOMINAL WITH SALPINGECTOMY Left 11/06/2016   Procedure: HYSTERECTOMY ABDOMINAL WITH SALPINGECTOMY;  Surgeon: Christeen Douglas, MD;  Location: [facility];  Service: Gynecology;  Laterality: Left;   LEFT HEART CATH AND CORONARY ANGIOGRAPHY Right 08/01/2016   Procedure: Left Heart Cath and Coronary Angiography;  Surgeon: Laurier Nancy, MD;  Location: [facility] IN[facility]IVE [facility];  Service: Cardiovascular;  Laterality: Right;   LEFT HEART CATH AND CORONARY ANGIOGRAPHY Left 04/04/2021   Procedure: LEFT HEART CATH AND CORONARY ANGIOGRAPHY; Location: [facility]; Surgeon: Eppie Gibson, MD   PARATHYROIDECTOMY N/A 06/07/2020   ROTATOR CUFF REPAIR Left     Social History Social History   Tobacco Use   Smoking status: Never   Smokeless tobacco: Never  Substance Use Topics   Alcohol use: No   Drug use: No    Family History Family History  Problem Relation Age of Onset   CAD Father    Breast cancer Neg Hx     Allergies  Allergen Reactions   Hydrochlorothiazide Cough   Latex Swelling and Rash    Other reaction(s): Unknown   Shrimp [Shellfish Allergy] Anaphylaxis   Ace Inhibitors Cough     REVIEW OF SYSTEMS (Negative unless checked)  Constitutional: [] Weight loss  [] Fever  [] Chills Cardiac: [] Chest pain   [] Chest pressure   [] Palpitations   [] Shortness of breath when laying flat   [] Shortness of breath with exertion. Vascular:  [] Pain in legs with walking   [] Pain in legs at rest  [] History of DVT   [] Phlebitis   [] Swelling in legs   [] Varicose veins   [] Non-healing ulcers Pulmonary:   [] Uses home oxygen   [] Productive cough   [] Hemoptysis   [] Wheeze  [] COPD   [] Asthma Neurologic:  [] Dizziness   [] Seizures   [] History of stroke   [] History of TIA  [] Aphasia   [] Vissual changes   [] Weakness or numbness in arm    [] Weakness or numbness in leg Musculoskeletal:   [] Joint swelling   [] Joint pain   [] Low back pain Hematologic:  [] Easy bruising  [] Easy bleeding   [] Hypercoagulable state   [] Anemic Gastrointestinal:  [] Diarrhea   [] Vomiting  [] Gastroesophageal reflux/heartburn   [] Difficulty swallowing. Genitourinary:  [x] Chronic kidney disease   [] Difficult urination  [] Frequent urination   [] Blood in urine Skin:  [] Rashes   [] Ulcers  Psychological:  [] History of anxiety   []  History of major depression.  Physical Examination  Vitals:   06/29/23 0627  BP: 137/82  Pulse: 86  Resp: 18  Temp: (!) 97.4 F (36.3 C)  TempSrc: Tympanic  SpO2: 97%  Weight: (!) 158.3 kg  Height: 5\' 4"  (1.626 m)   Body mass index is 59.91 kg/m. Gen: WD/WN, NAD Head: [place]/AT, No temporalis wasting.  Ear/Nose/Throat: Hearing grossly intact, nares w/o erythema or drainage Eyes: PER, EOMI, sclera nonicteric.  Neck: Supple, no gross masses or lesions.  No JVD.  Pulmonary:  Good air movement, no audible wheezing, no use of accessory muscles.  Cardiac: RRR, precordium non-hyperdynamic. Vascular:   Left arm brachiocephalic fistula with positive thrill positive bruit Vessel Right Left  Radial Palpable Palpable  Brachial Palpable Palpable  Gastrointestinal: soft, non-distended. No guarding/no peritoneal signs.  Musculoskeletal: M/S 5/5 throughout.  No deformity.  Neurologic: CN 2-12 intact. Pain and light touch intact in extremities.  Symmetrical.  Speech is fluent. Motor exam as listed above. Psychiatric: Judgment intact, Mood & affect appropriate for pt's clinical situation. Dermatologic: No rashes or ulcers noted.  No changes consistent with cellulitis.   CBC Lab Results  Component Value Date   WBC 6.0 06/18/2023   HGB 11.5 (L) 06/18/2023   HCT 37.1 06/18/2023   MCV 86.3 06/18/2023   PLT 261 06/18/2023    BMET    Component Value Date/Time   NA 139 06/18/2023 0855   K 3.5 06/18/2023 0855   CL 107 06/18/2023  0855   CO2 23 06/18/2023 0855   GLUCOSE 86 06/18/2023 0855   BUN 54 (H) 06/18/2023 0855   CREATININE 3.05 (H) 06/18/2023 0855   CALCIUM 9.0 06/18/2023 0855   CALCIUM 9.4 12/21/2020 1145   GFRNONAA 18 (L) 06/18/2023 0855   GFRAA 37 (L) 07/04/2019 1109   Estimated Creatinine Clearance: 32.7 mL/min (A) (by C-G formula based on SCr of 3.05 mg/dL (H)).  COAG Lab Results  Component Value Date   INR 1.00 01/08/2017   INR 0.95 08/01/2016   INR 0.89 07/29/2016    Radiology No results found.   Assessment/Plan 1. Stage 4 chronic kidney disease (HCC) Recommend:  At this time the patient does not have appropriate extremity access for dialysis.  Although her brachiocephalic fistula is patent with a good flow volume it is relatively deep beneath the skin and therefore not acceptable for access, as a second issue there is also a velocity shift in the mid upper arm that will need to be addressed likely a retained valve or kink.  Patient should have a her left arm brachiocephalic fistula revised and superficialized.  The risks, benefits and alternative therapies were reviewed in detail with the patient.  All questions were answered.  The patient agrees to proceed with surgery.   The patient will follow up with me in the office after the surgery.   2. NSTEMI (non-ST elevated myocardial infarction) [facility]) The patient has received cardiac clearance  Continue cardiac and antihypertensive medications as already ordered and reviewed, no changes at this time.    Continue statin as ordered and reviewed, no changes at this time   3. Essential hypertension Continue antihypertensive medications as already ordered, these medications have been reviewed and there are no changes at this time.   4. Gastroesophageal reflux disease without esophagitis Continue PPI as already ordered, this medication has been reviewed and there are no changes at this time.   Avoidence of caffeine and alcohol   Moderate  elevation of the head of the bed    5. Mixed hyperlipidemia Continue statin as ordered and reviewed, no changes at this time   Levora Dredge, MD  06/29/2023 6:47 AM

## 2023-06-30 ENCOUNTER — Encounter: Payer: Self-pay | Admitting: Vascular Surgery

## 2023-07-02 LAB — SURGICAL PATHOLOGY

## 2023-07-03 LAB — BPAM RBC
Blood Product Expiration Date: 202503262359
Blood Product Expiration Date: 202503262359
Unit Type and Rh: 5100
Unit Type and Rh: 5100

## 2023-07-03 LAB — TYPE AND SCREEN
ABO/RH(D): O POS
Antibody Screen: POSITIVE
Unit division: 0
Unit division: 0

## 2023-07-09 ENCOUNTER — Encounter (INDEPENDENT_AMBULATORY_CARE_PROVIDER_SITE_OTHER): Payer: Self-pay

## 2023-07-09 ENCOUNTER — Telehealth (INDEPENDENT_AMBULATORY_CARE_PROVIDER_SITE_OTHER): Payer: Self-pay | Admitting: Vascular Surgery

## 2023-07-09 NOTE — Telephone Encounter (Signed)
 Pt called wanting to know when she can go back to work and if she has to wait to see GS, her appt is on 08/02/23. Pt states she cannot wait till April to go back to work please advise

## 2023-07-09 NOTE — Telephone Encounter (Signed)
Work note has been sent through [employer]

## 2023-07-20 ENCOUNTER — Other Ambulatory Visit: Payer: Self-pay | Admitting: Family Medicine

## 2023-07-20 DIAGNOSIS — Z1231 Encounter for screening mammogram for malignant neoplasm of breast: Secondary | ICD-10-CM

## 2023-07-26 ENCOUNTER — Encounter (INDEPENDENT_AMBULATORY_CARE_PROVIDER_SITE_OTHER): Payer: Self-pay | Admitting: Internal Medicine

## 2023-07-26 DIAGNOSIS — G4733 Obstructive sleep apnea (adult) (pediatric): Secondary | ICD-10-CM

## 2023-08-01 ENCOUNTER — Other Ambulatory Visit (INDEPENDENT_AMBULATORY_CARE_PROVIDER_SITE_OTHER): Payer: Self-pay | Admitting: Vascular Surgery

## 2023-08-01 DIAGNOSIS — N186 End stage renal disease: Secondary | ICD-10-CM | POA: Insufficient documentation

## 2023-08-01 NOTE — Progress Notes (Signed)
 Patient ID: [name], female   DOB: [birth year], 53 y.o.   MRN: [id number]  No chief complaint on file.   HPI [name] is a 53 y.o. female.    Procedure 06/29/2023: 1.  Revision left brachial cephalic fistula to a brachial axillary Artegraft 2.  Resection of a 2 cm kinked segment of cephalic vein with primary end-to-end anastomosis.    She denies any pain at the incision.  She denies hand pain.  She does note some discomfort in the forearm but this seems to be getting better.  Past Medical History:  Diagnosis Date   Anemia    Anginal pain (HCC)    Anxiety    a.) on BZO (diazepam) PRN   Arthritis    Cerebellar stroke (HCC)    a.) CT head 07/29/2016 --> small old infarct at posterior RIGHT cerebellum   Cholelithiasis    CKD (chronic kidney disease), stage IV (HCC)    Diastasis recti    Diastolic dysfunction 07/31/2016   a.) TTE 07/31/2016: EF 65%, mild LV dil, mod MR, G1DD   Diverticulosis    DOE (dyspnea on exertion)    Fibroid uterus    GERD (gastroesophageal reflux disease)    Heart palpitations    History of left heart catheterization 08/01/2016   a.) LHC 08/01/2016 --> normal coronaries; b.) LHC 04/04/2021: normal coronaries   Hypertension    Long term current use of aspirin    Meningioma (HCC) 07/29/2016   a.) CT head 07/29/2016:  11 x 12 x 11 mm at the high posterior RIGHT parietal region; b.) CT head 01/08/2017: stable at 12 mm   Mild cardiomegaly    NSTEMI (non-ST elevated myocardial infarction) (HCC) 07/29/2016   a.) NSTEMI 07/29/2016: troponins trended 2.54 --> 3.02 --> 3.87 --> 3.80 ng/L; b.) LHC 08/01/2016: normal coronaries   OSA (obstructive sleep apnea)    a.) does not utilize nocturnal PAP therapy   Primary hyperparathyroidism (HCC)    a.) s/p parathyroidectomy 06/07/2020; parathyroid adenoma identifed and resected   PSVT (paroxysmal supraventricular tachycardia) (HCC)    a.) holter 01/29/2023: 9 runs with the fastest lasting 5 beat    Umbilical hernia     Past Surgical History:  Procedure Laterality Date   AV FISTULA PLACEMENT Left 10/20/2022   Procedure: ARTERIOVENOUS (AV) FISTULA CREATION ( BRACHIALCEPHALIC);  Surgeon: Renford Dills, MD;  Location: [facility];  Service: Vascular;  Laterality: Left;   CYSTOSCOPY Bilateral 11/06/2016   Procedure: CYSTOSCOPY with stent placement;  Surgeon: Christeen Douglas, MD;  Location: [facility];  Service: Gynecology;  Laterality: Bilateral;   FISTULA SUPERFICIALIZATION Left 06/29/2023   Procedure: FISTULA SUPERFICIALIZATION ( POSSIBLE TRANSPOSITION);  Surgeon: Renford Dills, MD;  Location: [facility];  Service: Vascular;  Laterality: Left;   HYSTERECTOMY ABDOMINAL WITH SALPINGECTOMY Left 11/06/2016   Procedure: HYSTERECTOMY ABDOMINAL WITH SALPINGECTOMY;  Surgeon: Christeen Douglas, MD;  Location: [facility];  Service: Gynecology;  Laterality: Left;   LEFT HEART CATH AND CORONARY ANGIOGRAPHY Right 08/01/2016   Procedure: Left Heart Cath and Coronary Angiography;  Surgeon: Laurier Nancy, MD;  Location: [facility];  Service: Cardiovascular;  Laterality: Right;   LEFT HEART CATH AND CORONARY ANGIOGRAPHY Left 04/04/2021   Procedure: LEFT HEART CATH AND CORONARY ANGIOGRAPHY; Location: [facility]; Surgeon: Eppie Gibson, MD   PARATHYROIDECTOMY N/A 06/07/2020   ROTATOR CUFF REPAIR Left       Allergies  Allergen Reactions   Hydrochlorothiazide Cough   Latex Swelling and Rash  Other reaction(s): Unknown   Shrimp [Shellfish Allergy] Anaphylaxis   Ace Inhibitors Cough    Current Outpatient Medications  Medication Sig Dispense Refill   acetaminophen (TYLENOL) 500 MG tablet Take 2 tablets (1,000 mg total) by mouth every 6 (six) hours. 30 tablet 0   amLODipine (NORVASC) 10 MG tablet Take 10 mg by mouth in the morning.     aspirin EC 81 MG tablet Take 81 mg by mouth in the morning. Swallow whole.     atorvastatin (LIPITOR) 40 MG tablet Take 1 tablet (40 mg total) by mouth daily at 6  PM. (Patient taking differently: Take 40 mg by mouth at bedtime.) 30 tablet 2   Cholecalciferol (VITAMIN D3) 50 MCG (2000 UT) capsule Take 2,000 Units by mouth in the morning.     febuxostat (ULORIC) 40 MG tablet Take 40 mg by mouth in the morning.     furosemide (LASIX) 40 MG tablet Take 40 mg by mouth in the morning.     gabapentin (NEURONTIN) 300 MG capsule TAKE 1 CAPSULE BY MOUTH THREE TIMES DAILY MAY CAUSE DROWSINESS     hydrALAZINE (APRESOLINE) 50 MG tablet Take 50 mg by mouth 3 (three) times daily.     losartan (COZAAR) 100 MG tablet Take 100 mg by mouth in the morning.     metoprolol succinate (TOPROL-XL) 50 MG 24 hr tablet Take 50 mg by mouth in the morning.     nortriptyline (PAMELOR) 10 MG capsule Take 20 mg by mouth at bedtime.     omeprazole (PRILOSEC) 20 MG capsule Take 20 mg by mouth daily before breakfast.     oxyCODONE-acetaminophen (PERCOCET) 5-325 MG tablet Take 1-2 tablets by mouth every 6 (six) hours as needed for severe pain (pain score 7-10) or moderate pain (pain score 4-6). 34 tablet 0   No current facility-administered medications for this visit.        Physical Exam LMP  (LMP Unknown)  Gen:  WD/WN, NAD Skin: incision C/D/I; good thrill good bruit there is still moderate amount of postoperative changes making direct palpation of the fistula slightly difficult.     Assessment/Plan:  1. End stage renal disease (HCC) (Primary) The patient has a functioning fistula with a great duplex ultrasound.  I believe we are still dealing with some postoperative changes I will see her back in 3 months at which time I am hopeful that she will be cleared for use of her fistula.      Levora Dredge 08/01/2023, 8:46 AM   This note was created with Dragon medical transcription system.  Any errors from dictation are unintentional.

## 2023-08-02 ENCOUNTER — Ambulatory Visit (INDEPENDENT_AMBULATORY_CARE_PROVIDER_SITE_OTHER): Payer: Medicare HMO

## 2023-08-02 ENCOUNTER — Ambulatory Visit (INDEPENDENT_AMBULATORY_CARE_PROVIDER_SITE_OTHER): Payer: Medicare HMO | Admitting: Vascular Surgery

## 2023-08-02 ENCOUNTER — Encounter (INDEPENDENT_AMBULATORY_CARE_PROVIDER_SITE_OTHER): Payer: Self-pay | Admitting: Vascular Surgery

## 2023-08-02 VITALS — BP 137/82 | HR 75 | Resp 18 | Wt 344.0 lb

## 2023-08-02 DIAGNOSIS — N186 End stage renal disease: Secondary | ICD-10-CM

## 2023-08-05 ENCOUNTER — Encounter (INDEPENDENT_AMBULATORY_CARE_PROVIDER_SITE_OTHER): Payer: Self-pay | Admitting: Vascular Surgery

## 2023-08-06 ENCOUNTER — Ambulatory Visit
Admission: RE | Admit: 2023-08-06 | Discharge: 2023-08-06 | Disposition: A | Discharge status: Home / Self Care | Source: Ambulatory Visit | Attending: Family Medicine | Admitting: Family Medicine

## 2023-08-06 DIAGNOSIS — Z1231 Encounter for screening mammogram for malignant neoplasm of breast: Secondary | ICD-10-CM | POA: Insufficient documentation

## 2023-08-16 NOTE — Procedures (Signed)
 [facility]  Polysomnogram Report Part I                                                               Phone: [phone] Fax: [phone]  Patient Name: [name], [name]. Acquisition Number: [id number]  Date of Birth: 08-31-1970 Acquisition Date: 07/26/2023  Referring Physician: Aneita Baptise, PA-C     History: The patient is a 53 year old  who was referred for evaluation of . Medical History:  GERD, Hyperlipidemia, stage 4 chronic kidney disease, Fibroid uterus, Anemia, Irregular menses, Cough, Obseity, Hypertension.  Medications: Norvasc, Lasix, Tylenol, Aspirin, Lipitor, Vitamin D3, Flexeril, Valium, Colace, Uloric, Neurontin, Apresoline, Norco, Cozaar, Toprol-XL, Lopressor, Pamelor, Ultram.  Procedure: This routine overnight polysomnogram was performed on the [facility] using the standard diagnostic protocol. This included 6 channels of EEG, 2 channels of EOG, chin EMG, bilateral anterior tibialis EMG, nasal/oral thermistor, PTAF (nasal pressure transducer), chest and abdominal wall movements, EKG, and pulse oximetry.  Description: The total recording time was 385.5 minutes. The total sleep time was 327.0 minutes. There were a total of 54.5 minutes of wakefulness after sleep onset for a reducedsleep efficiency of 84.8%. The latency to sleep onset was shortat 4.0 minutes. The R sleep onset latency was within normal limits at 92.5 minutes. Sleep parameters, as a percentage of the total sleep time, demonstrated 4.4% of sleep was in N1 sleep, 84.9% N2, 0.0% N3 and 10.7% R sleep. There were a total of 72 arousals for an arousal index of 13.2 arousals per hour of sleep that was slightly elevated.  Respiratory monitoring demonstrated   snoring . There were 120 apneas and hypopneas for an Apnea Hypopnea Index of 22.0 apneas and hypopneas per hour of sleep. The REM related apnea hypopnea index was 63.4/hr of REM sleep compared to a NREM AHI of 17.1/hr.  The average duration of the respiratory  events was 12.9 seconds with a maximum duration of 25.0 seconds. The respiratory events occurred . The respiratory events were associated with peripheral oxygen desaturations on the average to 90%. The lowest oxygen desaturation associated with a respiratory event was 63%. Additionally, the baseline oxygen saturation during wakefulness was 96%, during NREM sleep averaged 96%, and during REM sleep averaged  94%. The total duration of oxygen < 90% was 9.7 minutes and <80% was 1.2 minutes.  Cardiac monitoring-  demonstrate transient cardiac decelerations associated with the apneas. Interminntent PACs and PVCs were observed.   Periodic limb movement monitoring- demonstrated that there were 23 periodic limb movements for a periodic limb movement index of 4.2 periodic limb movements per hour of sleep.   Impression: This routine overnight polysomnogram demonstrated significant obstructive sleep apnea with an overall Apnea Hypopnea Index of 22.0 apneas and hypopneas per hour of sleep. The respiratory events were more severe in REM sleep with an AHI of 63.4 and with with the lowest desaturation to 63%.  As REM percentage was reduced the findings likely underestimate the severity of the sleep apnea.  There were few periodic limb movements that commonly are not significant. Clinical correlation would be suggested.   reduced sleep efficiency with a slightly elevated arousal index, a reduced REM percentage and no slow wave sleep. These findings would appear to be due to the obstructive sleep  apnea.  Recommendations:    A CPAP titration would be recommended due to the severity of the sleep apnea. Additionally, would recommend weight loss in a patient with a BMI of 59.9.     [name], MD, [facility] Diplomate ABMS-Pulmonary, Critical Care and Sleep Medicine  Electronically reviewed and digitally signed  [facility] Polysomnogram Report Part II  Phone: [phone] Fax: [phone]  Patient last name Difrancesco Neck Size 16.0 in. Acquisition [id number]  Patient first name Lauren R. Weight 349.0 lbs. Started 07/26/2023 at 11:23:55 PM  Birth date [birth year] Height 64.0 in. Stopped 07/27/2023 at 6:01:13 AM  Age 42 BMI 59.9 lb/in2 Duration 385.5  Study Type Adult      [place] Blaze, RPSGT & [employer] Carlon [place]  Reviewed by: Kathe G. Henke, PhD, ABSM, FAASM Sleep Data: Lights Out: 11:30:55 PM Sleep Onset: 11:34:55 PM  Lights On: 5:56:25 AM Sleep Efficiency: 83.7 %  Total Recording Time: 385.5 min Sleep Latency (from Lights Off) 4.0 min  Total Sleep Time (TST): 322.5 min R Latency (from Sleep Onset): 92.5 min  Sleep Period Time: 381.5 min Total number of awakenings: 12  Wake during sleep: 59.0 min Wake After Sleep Onset (WASO): 59.0 min   Sleep Data:         Arousal Summary: Stage  Latency from lights out (min) Latency from sleep onset (min) Duration (min) % Total Sleep Time  Normal values  N 1 4.0 0.0 15.0 4.7 (5%)  N 2 6.0 2.0 272.5 84.5 (50%)  N 3       0.0 0.0 (20%)  R 96.5 92.5 35.0 10.9 (25%)   Number Index  Spontaneous 17 3.2  Apneas & Hypopneas 22 4.1  RERAs 0 0.0       (Apneas & Hypopneas & RERAs)  (22) (4.1)  Limb Movement 34 6.3  Snore 0 0.0  TOTAL 73 13.6     Respiratory Data:  CA OA MA Apnea Hypopnea* A+ H RERA Total  Number 0 77 0 77 43 120 0 120  Mean Dur (sec) 0.0 13.0 0.0 13.0 12.7 12.9 0.0 12.9  Max Dur (sec) 0.0 25.0 0.0 25.0 19.5 25.0 0.0 25.0  Total Dur (min) 0.0 16.7 0.0 16.7 9.1 25.8 0.0 25.8  % of TST 0.0 5.2 0.0 5.2 2.8 8.0 0.0 8.0  Index (#/h TST) 0.0 14.3 0.0 14.3 8.0 22.3 0.0 22.3  *Hypopneas scored based on 4% or greater desaturation.  Sleep Stage:        REM NREM TST  AHI 63.4 17.3 22.3  RDI 63.4 17.3 22.3           Body Position Data:  Sleep (min) TST (%) REM (min) NREM (min) CA (#) OA (#) MA (#) HYP (#) AHI (#/h) RERA (#) RDI (#/h) Desat (#)  Supine 263.5 81.71 26.5 237.0 0 68 0 30 22.3 0 22.3 113   Non-Supine 59.00 18.29 8.50 50.50 0.00 9.00 0.00 13.00 22.37 0 22.37 36.00  Left: 0.0 0.00 0.0 0.0 0 0 0 0 0.0 0 0.00 0  Right: 59.0 18.29 8.5 50.5 0 9 0 13 22.4 0 22.4 36     Snoring: Total number of snoring episodes  0  Total time with snoring    min (   % of sleep)   Oximetry Distribution:             WK REM NREM TOTAL  Average (%)   96 94 96 96  < 90% 2.7 4.7 2.3  9.7  < 80% 0.7 0.0 0.5 1.2  < 70% 0.3 0.0 0.4 0.7  # of Desaturations* 3 42 104 149  Desat Index (#/hour) 3.4 72.0 21.7 27.7  Desat Max (%) 12 19 57 57  Desat Max Dur (sec) 28.0 29.0 62.0 62.0  Approx Min O2 during sleep 39  Approx min O2 during a respiratory event 63  Was Oxygen added (Y/N) and final rate :    LPM  *Desaturations based on 4% or greater drop from baseline.   Cheyne Stokes Breathing: None Present   Heart Rate Summary:  Average Heart Rate During Sleep 68.2 bpm      Highest Heart Rate During Sleep (95th %) 85.0 bpm      Highest Heart Rate During Sleep 152 bpm (artifact)  Highest Heart Rate During Recording (TIB) 199 bpm (artifact)   Heart Rate Observations: Event Type # Events   Bradycardia 0 Lowest HR Scored: N/A  Sinus Tachycardia During Sleep 0 Highest HR Scored: N/A  Narrow Complex Tachycardia 0 Highest HR Scored: N/A  Wide Complex Tachycardia 0 Highest HR Scored: N/A  Asystole 0 Longest Pause: N/A  Atrial Fibrillation 0 Duration Longest Event: N/A  Other Arrythmias   Type:    Periodic Limb Movement Data: (Primary legs unless otherwise noted) Total # Limb Movement 59 Limb Movement Index 11.0  Total # PLMS 23 PLMS Index 4.3  Total # PLMS Arousals 11 PLMS Arousal Index 2.0  Percentage Sleep Time with PLMS 14.24min (4.4 % sleep)  Mean Duration limb movements (secs) 212.9

## 2023-08-21 ENCOUNTER — Encounter: Payer: Self-pay | Admitting: Neurology

## 2023-08-21 ENCOUNTER — Other Ambulatory Visit: Payer: Self-pay | Admitting: Neurology

## 2023-08-21 DIAGNOSIS — R200 Anesthesia of skin: Secondary | ICD-10-CM

## 2023-08-21 DIAGNOSIS — M545 Low back pain, unspecified: Secondary | ICD-10-CM

## 2023-08-30 ENCOUNTER — Encounter (INDEPENDENT_AMBULATORY_CARE_PROVIDER_SITE_OTHER): Admitting: Internal Medicine

## 2023-08-30 DIAGNOSIS — G4733 Obstructive sleep apnea (adult) (pediatric): Secondary | ICD-10-CM

## 2023-09-07 ENCOUNTER — Inpatient Hospital Stay: Admission: RE | Admit: 2023-09-07 | Source: Ambulatory Visit

## 2023-09-18 ENCOUNTER — Encounter (INDEPENDENT_AMBULATORY_CARE_PROVIDER_SITE_OTHER): Payer: Self-pay

## 2023-09-19 NOTE — Procedures (Signed)
 [facility]  Polysomnogram Report Part I  Phone: [phone] Fax: [phone]  Patient Name: [name], [name]. Acquisition Number: [id number]  Date of Birth: 10-Jun-1970 Acquisition Date: 08/30/2023  Referring Physician: Aneita Baptise, PA-C     History: The patient is a 53 year old  . Medical History: GERD, Hyperlipidemia, stage 4 chronic kidney disease, Fibroid uterus, Anemia, Irregular menses, Cough, Obseity, Hypertension.  Medications: Norvasc , Lasix, Tylenol , Aspirin , Lipitor, Vitamin D3, Flexeril, Valium, Colace, Uloric, Neurontin , Apresoline , Norco, Cozaar , Toprol -XL, Lopressor , Pamelor, Ultram.  Procedure: This routine overnight polysomnogram was performed on the [facility] 5 using the standard CPAP protocol. This included 6 channels of EEG, 2 channels of EOG, chin EMG, bilateral anterior tibialis EMG, nasal/oral thermistor, PTAF (nasal pressure transducer), chest and abdominal wall movements, EKG, and pulse oximetry.  Description: The total recording time was 462.0 minutes. The total sleep time was 254.5 minutes. There were a total of 198.0 minutes of wakefulness after sleep onset for a poorsleep efficiency of 55.1%. The latency to sleep onset was shortat 9.5 minutes. The R sleep onset latency was prolonged at 399.0 minutes. Sleep parameters, as a percentage of the total sleep time, demonstrated 20.4% of sleep was in N1 sleep, 60.7% N2, 2.2% N3 and 16.7% R sleep. There were a total of 19 arousals for an arousal index of 4.5 arousals per hour of sleep that was normal.  Overall, there were a total of 6 respiratory events for a respiratory disturbance index, which includes apneas, hypopneas and RERAs (increased respiratory effort) of 1.4 respiratory events per hour of sleep during the pressure titration.  was initiated at 4 cm H2O at lights out, 10:08 p.m. It was titrated to the final pressure of 6 cmH2O. The apnea was controlled at this pressure and REM sleep was  observed.  Additionally, the baseline oxygen saturation during wakefulness was 99%, during NREM sleep averaged 99%, and during REM sleep averaged 99%. The total duration of oxygen < 90% was 1.9 minutes.  Cardiac monitoring- Intermittent, multi-focal PVCs with rare bigeminy and intermittent PACs were observed.   Periodic limb movement monitoring- demonstrated that there were 59 periodic limb movements for a periodic limb movement index of 13.9 periodic limb movements per hour of sleep. Quasi-periodic limb movements were observed during some periods of wakefulness.   Impression: This patient's obstructive sleep apnea demonstrated significant improvement with the utilization of nasal .   There was a slightly elevated periodic limb movement index of 13.9 periodic limb movements per hour of sleep. In addition, quasi-periodic limb movements were observed during some periods of wakefulness. Some limb movements were observed during the prior PSG. Treatment may be indicated if sleep disruption or sleepiness persist once the patient is fully compliant with CPAP.  Intermittent, multi-focal PVCs with rare bigeminy and intermittent PACs were again observed. Cardiac evaluation is suggested if this is a new finding.  Recommendations: Would recommend utilization of nasal  at 6 cm H2O.     \ An AirFit F40  mask, size Large , was used. Chin strap used during study- No . Humidifier used during study- Yes .     [name], MD, [facility] Diplomate ABMS-Pulmonary, Critical Care and Sleep Medicine  Electronically reviewed and digitally signed  [facility] CPAP/BIPAP Polysomnogram Report Part II Phone: [phone] Fax: [phone]  Patient last name Demuro Neck Size 16   in. Acquisition [id number]  Patient first name Lauren R. Weight 349.0 lbs. Started 08/30/2023 at 10:01:51 PM  Birth date 05/15/1970  Height 64.0 in. Stopped 08/31/2023 at 5:57:15 AM  Age 55      Type Adult BMI 59.9 lb/in2 Duration  462.0  [place] Blaze. RPSGT. // Aron Bien  Reviewed by: Delmus Ferri. Henke, PhD, ABSM, FAASM Sleep Data: Lights Out: 10:08:51 PM Sleep Onset: 10:18:21 PM  Lights On: 5:50:51 AM Sleep Efficiency: 55.1 %  Total Recording Time: 462.0 min Sleep Latency (from Lights Off) 9.5 min  Total Sleep Time (TST): 254.5 min R Latency (from Sleep Onset): 399.0 min  Sleep Period Time: 452.5 min Total number of awakenings: 36  Wake during sleep: 198.0 min Wake After Sleep Onset (WASO): 198.0 min   Sleep Data:         Arousal Summary: Stage  Latency from lights out (min) Latency from sleep onset (min) Duration (min) % Total Sleep Time  Normal values  N 1 9.5 0.0 52.0 20.4 (5%)  N 2 11.5 2.0 154.5 60.7 (50%)  N 3 48.0 38.5 5.5 2.2 (20%)  R 408.5 399.0 42.5 16.7 (25%)   Number Index  Spontaneous 18 4.2  Apneas & Hypopneas 0 0.0  RERAs 0 0.0       (Apneas & Hypopneas & RERAs)  (0) (0.0)  Limb Movement 2 0.5  Snore 0 0.0  TOTAL 20 4.7     Respiratory Data:  CA OA MA Apnea Hypopnea* A+ H RERA Total  Number 1 0 0 1 5 6  0 6  Mean Dur (sec) 14.5 0.0 0.0 14.5 13.9 14.0 0.0 14.0  Max Dur (sec) 14.5 0.0 0.0 14.5 15.0 15.0 0.0 15.0  Total Dur (min) 0.2 0.0 0.0 0.2 1.2 1.4 0.0 1.4  % of TST 0.1 0.0 0.0 0.1 0.5 0.6 0.0 0.6  Index (#/h TST) 0.2 0.0 0.0 0.2 1.2 1.4 0.0 1.4  *Hypopneas scored based on 4% or greater desaturation.  Sleep Stage:         REM NREM TST  AHI 0.0 1.7 1.4  RDI 0.0 1.7 1.4   Sleep (min) TST (%) REM (min) NREM (min) CA (#) OA (#) MA (#) HYP (#) AHI (#/h) RERA (#) RDI (#/h) Desat (#)  Supine 64.0 25.15 0.0 64.0 0 0 0 5 4.7 0 4.7 6  Non-Supine 190.50 74.85 42.50 148.00 1.00 0.00 0.00 0.00 0.31 0 0.31 5.00  Left: 98.0 38.51 42.5 55.5 1 0 0 0 0.6 0 0.6 3  Right: 92.5 36.35 0.0 92.5 0 0 0 0 0.0 0 0.00 2     Snoring: Total number of snoring episodes  0  Total time with snoring    min (   % of sleep)   Oximetry Distribution:             WK REM NREM TOTAL  Average  (%)   99 99 99 99  < 90% 1.4 0.0 0.5 1.9  < 80% 0.3 0.0 0.0 0.3  < 70% 0.3 0.0 0.0 0.3  # of Desaturations* 3 0 8 11  Desat Index (#/hour) 1.0 0.0 2.3 2.7  Desat Max (%) 12 0 22 22  Desat Max Dur (sec) 26.0 0.0 23.0 26.0  Approx Min O2 during sleep 78  Approx min O2 during a respiratory event 93  Was Oxygen added (Y/N) and final rate :    LPM  *Desaturations based on 4% or greater drop from baseline.   Cheyne Stokes Breathing: None Present    Heart Rate Summary:  Average Heart Rate During Sleep 81.2 bpm      Highest Heart  Rate During Sleep (95th %) 89.0 bpm      Highest Heart Rate During Sleep 139 bpm      Highest Heart Rate During Recording (TIB) 237 bpm (artifact)   Heart Rate Observations: Event Type # Events   Bradycardia 0 Lowest HR Scored: N/A  Sinus Tachycardia During Sleep 0 Highest HR Scored: N/A  Narrow Complex Tachycardia 0 Highest HR Scored: N/A  Wide Complex Tachycardia 0 Highest HR Scored: N/A  Asystole 0 Longest Pause: N/A  Atrial Fibrillation 0 Duration Longest Event: N/A  Other Arrythmias   Type:   Periodic Limb Movement Data: (Primary legs unless otherwise noted) Total # Limb Movement 59 Limb Movement Index 13.9  Total # PLMS 59 PLMS Index 13.9  Total # PLMS Arousals 2 PLMS Arousal Index 0.5  Percentage Sleep Time with PLMS 39.92min (15.3 % sleep)  Mean Duration limb movements (secs) 585.5    IPAP Level (cmH2O) EPAP Level (cmH2O) Total Duration (min) Sleep Duration (min) Sleep (%) REM (%) CA  #) OA # MA # HYP #) AHI (#/hr) RERAs # RERAs (#/hr) RDI (#/hr)  4 4 127.7 68.0 53.2 0.0 0 0 0 0 0.0 0 0.0 0.0  5 5 124.8 53.7 43.0 0.0 0 0 0 4 4.5 0 0.0 4.5  6 6  182.4 132.5 72.6 23.3 1 0 0 1 0.9 0 0.0 0.9

## 2023-10-25 NOTE — Progress Notes (Signed)
 MRN : [id number]  [name] is a 53 y.o. ([birth year]) female who presents with chief complaint of check access.  History of Present Illness:   The patient returns to the office for followup of their dialysis access.  She is not yet on dialysis.  Procedure 06/29/2023: 1.  Revision left brachial cephalic fistula to a brachial axillary Artegraft 2.  Resection of a 2 cm kinked segment of cephalic vein with primary end-to-end anastomosis.     The patient reports the function of the access has been stable. The patient describes hand pain when she is using her hands at work.  It is occurring in both hands and is only with activity.  No symptoms consistent with steal phenomena.  No significant arm swelling.  The patient denies any complaints from her nephrologist.  The patient denies redness or swelling at the access site. The patient denies fever or chills at home or while on dialysis.  No recent shortening of the patient's walking distance or new symptoms consistent with claudication.  No history of rest pain symptoms. No new ulcers or wounds of the lower extremities have occurred.  The patient denies amaurosis fugax or recent TIA symptoms. There are no recent neurological changes noted. There is no history of DVT, PE or superficial thrombophlebitis. No recent episodes of angina or shortness of breath documented.      No outpatient medications have been marked as taking for the 10/29/23 encounter (Appointment) with Jama, Cordella MATSU, MD.    Past Medical History:  Diagnosis Date   Anemia    Anginal pain (HCC)    Anxiety    a.) on BZO (diazepam) PRN   Arthritis    Cerebellar stroke (HCC)    a.) CT head 07/29/2016 --> small old infarct at posterior RIGHT cerebellum   Cholelithiasis    CKD (chronic kidney disease), stage IV (HCC)    Diastasis recti    Diastolic dysfunction 07/31/2016   a.) TTE 07/31/2016: EF 65%, mild LV dil, mod MR, G1DD    Diverticulosis    DOE (dyspnea on exertion)    Fibroid uterus    GERD (gastroesophageal reflux disease)    Heart palpitations    History of left heart catheterization 08/01/2016   a.) LHC 08/01/2016 --> normal coronaries; b.) LHC 04/04/2021: normal coronaries   Hypertension    Long term current use of aspirin     Meningioma (HCC) 07/29/2016   a.) CT head 07/29/2016:  11 x 12 x 11 mm at the high posterior RIGHT parietal region; b.) CT head 01/08/2017: stable at 12 mm   Mild cardiomegaly    NSTEMI (non-ST elevated myocardial infarction) (HCC) 07/29/2016   a.) NSTEMI 07/29/2016: troponins trended 2.54 --> 3.02 --> 3.87 --> 3.80 ng/L; b.) LHC 08/01/2016: normal coronaries   OSA (obstructive sleep apnea)    a.) does not utilize nocturnal PAP therapy   Primary hyperparathyroidism (HCC)    a.) s/p parathyroidectomy 06/07/2020; parathyroid  adenoma identifed and resected   PSVT (paroxysmal supraventricular tachycardia) (HCC)    a.) holter 01/29/2023: 9 runs with the fastest lasting 5 beat   Umbilical hernia     Past Surgical History:  Procedure Laterality Date   AV FISTULA PLACEMENT Left 10/20/2022   Procedure: ARTERIOVENOUS (AV) FISTULA CREATION ( BRACHIALCEPHALIC);  Surgeon: Jama Cordella MATSU, MD;  Location: [facility];  Service: Vascular;  Laterality: Left;   CYSTOSCOPY  Bilateral 11/06/2016   Procedure: CYSTOSCOPY with stent placement;  Surgeon: Verdon Keen, MD;  Location: [facility];  Service: Gynecology;  Laterality: Bilateral;   FISTULA SUPERFICIALIZATION Left 06/29/2023   Procedure: FISTULA SUPERFICIALIZATION ( POSSIBLE TRANSPOSITION);  Surgeon: Jama Cordella MATSU, MD;  Location: [facility];  Service: Vascular;  Laterality: Left;   HYSTERECTOMY ABDOMINAL WITH SALPINGECTOMY Left 11/06/2016   Procedure: HYSTERECTOMY ABDOMINAL WITH SALPINGECTOMY;  Surgeon: Verdon Keen, MD;  Location: [facility];  Service: Gynecology;  Laterality: Left;   LEFT HEART CATH AND CORONARY ANGIOGRAPHY Right  08/01/2016   Procedure: Left Heart Cath and Coronary Angiography;  Surgeon: Denyse DELENA Bathe, MD;  Location: [facility];  Service: Cardiovascular;  Laterality: Right;   LEFT HEART CATH AND CORONARY ANGIOGRAPHY Left 04/04/2021   Procedure: LEFT HEART CATH AND CORONARY ANGIOGRAPHY; Location: [facility]; Surgeon: Fairy Ship, MD   PARATHYROIDECTOMY N/A 06/07/2020   ROTATOR CUFF REPAIR Left     Social History Social History   Tobacco Use   Smoking status: Never   Smokeless tobacco: Never  Substance Use Topics   Alcohol use: No   Drug use: No    Family History Family History  Problem Relation Age of Onset   CAD Father    Breast cancer Neg Hx     Allergies  Allergen Reactions   Hydrochlorothiazide  Cough   Latex Swelling and Rash    Other reaction(s): Unknown   Shrimp [Shellfish Allergy] Anaphylaxis   Ace Inhibitors Cough     REVIEW OF SYSTEMS (Negative unless checked)  Constitutional: [] Weight loss  [] Fever  [] Chills Cardiac: [] Chest pain   [] Chest pressure   [] Palpitations   [] Shortness of breath when laying flat   [] Shortness of breath with exertion. Vascular:  [] Pain in legs with walking   [] Pain in legs at rest  [] History of DVT   [] Phlebitis   [] Swelling in legs   [] Varicose veins   [] Non-healing ulcers Pulmonary:   [] Uses home oxygen   [] Productive cough   [] Hemoptysis   [] Wheeze  [] COPD   [] Asthma Neurologic:  [] Dizziness   [] Seizures   [] History of stroke   [] History of TIA  [] Aphasia   [] Vissual changes   [] Weakness or numbness in arm   [] Weakness or numbness in leg Musculoskeletal:   [] Joint swelling   [] Joint pain   [] Low back pain Hematologic:  [] Easy bruising  [] Easy bleeding   [] Hypercoagulable state   [] Anemic Gastrointestinal:  [] Diarrhea   [] Vomiting  [] Gastroesophageal reflux/heartburn   [] Difficulty swallowing. Genitourinary:  [x] Chronic kidney disease   [] Difficult urination  [] Frequent urination   [] Blood in urine Skin:  [] Rashes   [] Ulcers   Psychological:  [] History of anxiety   []  History of major depression.  Physical Examination  There were no vitals filed for this visit. There is no height or weight on file to calculate BMI. Gen: WD/WN, NAD Head: [place]/AT, No temporalis wasting.  Ear/Nose/Throat: Hearing grossly intact, nares w/o erythema or drainage Eyes: PER, EOMI, sclera nonicteric.  Neck: Supple, no gross masses or lesions.  No JVD.  Pulmonary:  Good air movement, no audible wheezing, no use of accessory muscles.  Cardiac: RRR, precordium non-hyperdynamic. Vascular:   Left upper arm AV access good thrill good bruit skin is well-healed Vessel Right Left  Radial Palpable Palpable  Brachial Palpable Palpable  Gastrointestinal: soft, non-distended. No guarding/no peritoneal signs.  Musculoskeletal: M/S 5/5 throughout.  No deformity.  Neurologic: CN 2-12 intact. Pain and light touch intact in extremities.  Symmetrical.  Speech is  fluent. Motor exam as listed above. Psychiatric: Judgment intact, Mood & affect appropriate for pt's clinical situation. Dermatologic: No rashes or ulcers noted.  No changes consistent with cellulitis.   CBC Lab Results  Component Value Date   WBC 6.0 06/18/2023   HGB 11.5 (L) 06/18/2023   HCT 37.1 06/18/2023   MCV 86.3 06/18/2023   PLT 261 06/18/2023    BMET    Component Value Date/Time   NA 139 06/18/2023 0855   K 3.5 06/18/2023 0855   CL 107 06/18/2023 0855   CO2 23 06/18/2023 0855   GLUCOSE 86 06/18/2023 0855   BUN 54 (H) 06/18/2023 0855   CREATININE 3.05 (H) 06/18/2023 0855   CALCIUM  9.0 06/18/2023 0855   CALCIUM  9.4 12/21/2020 1145   GFRNONAA 18 (L) 06/18/2023 0855   GFRAA 37 (L) 07/04/2019 1109   CrCl cannot be calculated (Patient's most recent lab result is older than the maximum 21 days allowed.).  COAG Lab Results  Component Value Date   INR 1.00 01/08/2017   INR 0.95 08/01/2016   INR 0.89 07/29/2016    Radiology No results found.   Assessment/Plan 1.  End stage renal disease (HCC) (Primary) Recommend:  The patient is doing well and currently has adequate dialysis access.  She is not yet on dialysis  The patient's nephrologist is not reporting any access issues.  The patient should have a duplex ultrasound of the dialysis access in 6 months. The patient will follow-up with me in the office after each ultrasound   - VAS US  DUPLEX DIALYSIS ACCESS (AVF, AVG); Future  2. Essential hypertension Continue antihypertensive medications as already ordered, these medications have been reviewed and there are no changes at this time.  3. Coronary artery disease involving native coronary artery of native heart with angina pectoris (HCC) Continue cardiac and antihypertensive medications as already ordered and reviewed, no changes at this time.  Continue statin as ordered and reviewed, no changes at this time  Nitrates PRN for chest pain  4. Mixed hyperlipidemia Continue statin as ordered and reviewed, no changes at this time    Cordella Shawl, MD  10/25/2023 8:33 AM

## 2023-10-29 ENCOUNTER — Ambulatory Visit (INDEPENDENT_AMBULATORY_CARE_PROVIDER_SITE_OTHER): Admitting: Vascular Surgery

## 2023-10-29 ENCOUNTER — Encounter (INDEPENDENT_AMBULATORY_CARE_PROVIDER_SITE_OTHER): Payer: Self-pay | Admitting: Vascular Surgery

## 2023-10-29 VITALS — BP 131/80 | HR 85 | Resp 18 | Wt 339.6 lb

## 2023-10-29 DIAGNOSIS — E782 Mixed hyperlipidemia: Secondary | ICD-10-CM | POA: Diagnosis not present

## 2023-10-29 DIAGNOSIS — I10 Essential (primary) hypertension: Secondary | ICD-10-CM

## 2023-10-29 DIAGNOSIS — I25119 Atherosclerotic heart disease of native coronary artery with unspecified angina pectoris: Secondary | ICD-10-CM

## 2023-10-29 DIAGNOSIS — N186 End stage renal disease: Secondary | ICD-10-CM

## 2023-11-13 ENCOUNTER — Other Ambulatory Visit: Payer: Self-pay | Admitting: Nephrology

## 2023-11-13 DIAGNOSIS — N184 Chronic kidney disease, stage 4 (severe): Secondary | ICD-10-CM

## 2023-11-20 ENCOUNTER — Ambulatory Visit
Admission: RE | Admit: 2023-11-20 | Discharge: 2023-11-20 | Disposition: A | Discharge status: Home / Self Care | Source: Ambulatory Visit | Attending: Nephrology | Admitting: Nephrology

## 2023-11-20 DIAGNOSIS — N184 Chronic kidney disease, stage 4 (severe): Secondary | ICD-10-CM | POA: Diagnosis present

## 2023-12-20 ENCOUNTER — Encounter (INDEPENDENT_AMBULATORY_CARE_PROVIDER_SITE_OTHER): Payer: Self-pay | Admitting: Nurse Practitioner

## 2023-12-20 ENCOUNTER — Ambulatory Visit (INDEPENDENT_AMBULATORY_CARE_PROVIDER_SITE_OTHER): Admitting: Nurse Practitioner

## 2023-12-20 ENCOUNTER — Ambulatory Visit (INDEPENDENT_AMBULATORY_CARE_PROVIDER_SITE_OTHER)

## 2023-12-20 VITALS — BP 133/73 | HR 87 | Ht 64.0 in | Wt 329.2 lb

## 2023-12-20 DIAGNOSIS — N186 End stage renal disease: Secondary | ICD-10-CM

## 2023-12-20 DIAGNOSIS — I10 Essential (primary) hypertension: Secondary | ICD-10-CM | POA: Diagnosis not present

## 2023-12-20 DIAGNOSIS — E782 Mixed hyperlipidemia: Secondary | ICD-10-CM

## 2023-12-23 ENCOUNTER — Encounter (INDEPENDENT_AMBULATORY_CARE_PROVIDER_SITE_OTHER): Payer: Self-pay | Admitting: Nurse Practitioner

## 2023-12-23 NOTE — Progress Notes (Unsigned)
 Subjective:    Patient ID: [name], female    DOB: August 11, 1970, 53 y.o.   MRN: [id number] Chief Complaint  Patient presents with   Follow-up    Patient presents today for evaluation of her dialysis access.  She presents today after having issues following an infiltration.  She notes that it is working without any significant issues.  She does have an area which is a little bit darker and hard and raised.  Right arm is swollen but it is actually improved from the initial infiltration.  Today she has a flow volume of 5145.  There is a hematoma seen in the mid upper arm measuring 3.07 x 1.58 cm.    Review of Systems  Neurological:  Positive for weakness.  All other systems reviewed and are negative.      Objective:   Physical Exam Vitals reviewed.  HENT:     Head: Normocephalic.  Cardiovascular:     Rate and Rhythm: Normal rate.     Pulses: Normal pulses.  Pulmonary:     Effort: Pulmonary effort is normal.  Skin:    General: Skin is warm and dry.  Neurological:     Mental Status: She is alert and oriented to person, place, and time.     Motor: Weakness present.  Psychiatric:        Mood and Affect: Mood normal.        Behavior: Behavior normal.        Thought Content: Thought content normal.        Judgment: Judgment normal.     BP 133/73   Pulse 87   Ht 5' 4 (1.626 m)   Wt (!) 329 lb 4 oz (149.3 kg)   LMP  (LMP Unknown)   BMI 56.52 kg/m   Past Medical History:  Diagnosis Date   Anemia    Anginal pain (HCC)    Anxiety    a.) on BZO (diazepam) PRN   Arthritis    Cerebellar stroke (HCC)    a.) CT head 07/29/2016 --> small old infarct at posterior RIGHT cerebellum   Cholelithiasis    CKD (chronic kidney disease), stage IV (HCC)    Diastasis recti    Diastolic dysfunction 07/31/2016   a.) TTE 07/31/2016: EF 65%, mild LV dil, mod MR, G1DD   Diverticulosis    DOE (dyspnea on exertion)    Fibroid uterus    GERD (gastroesophageal reflux disease)     Heart palpitations    History of left heart catheterization 08/01/2016   a.) LHC 08/01/2016 --> normal coronaries; b.) LHC 04/04/2021: normal coronaries   Hypertension    Long term current use of aspirin     Meningioma (HCC) 07/29/2016   a.) CT head 07/29/2016:  11 x 12 x 11 mm at the high posterior RIGHT parietal region; b.) CT head 01/08/2017: stable at 12 mm   Mild cardiomegaly    NSTEMI (non-ST elevated myocardial infarction) (HCC) 07/29/2016   a.) NSTEMI 07/29/2016: troponins trended 2.54 --> 3.02 --> 3.87 --> 3.80 ng/L; b.) LHC 08/01/2016: normal coronaries   OSA (obstructive sleep apnea)    a.) does not utilize nocturnal PAP therapy   Primary hyperparathyroidism (HCC)    a.) s/p parathyroidectomy 06/07/2020; parathyroid  adenoma identifed and resected   PSVT (paroxysmal supraventricular tachycardia) (HCC)    a.) holter 01/29/2023: 9 runs with the fastest lasting 5 beat   Umbilical hernia     Social History   Socioeconomic History   Marital status:  Significant Other    Spouse name: Elsie   Number of children: Not on file   Years of education: Not on file   Highest education level: Not on file  Occupational History   Not on file  Tobacco Use   Smoking status: Never   Smokeless tobacco: Never  Substance and Sexual Activity   Alcohol use: No   Drug use: No   Sexual activity: Not on file  Other Topics Concern   Not on file  Social History Narrative   Not on file   Social Drivers of Health   Financial Resource Strain: Low Risk  (10/29/2023)   Received from [facility]   Overall Financial Resource Strain (CARDIA)    Difficulty of Paying Living Expenses: Not hard at all  Food Insecurity: No Food Insecurity (10/29/2023)   Received from [facility]   Hunger Vital Sign    Within the past 12 months, you worried that your food would run out before you got the money to buy more.: Never true    Within the past 12 months, the food you bought  just didn't last and you didn't have money to get more.: Never true  Transportation Needs: No Transportation Needs (10/29/2023)   Received from [facility] - Transportation    In the past 12 months, has lack of transportation kept you from medical appointments or from getting medications?: No    Lack of Transportation (Non-Medical): No  Physical Activity: Inactive (06/09/2021)   Received from [facility]   Exercise Vital Sign    On average, how many days per week do you engage in moderate to strenuous exercise (like a brisk walk)?: 0 days    On average, how many minutes do you engage in exercise at this level?: 0 min  Stress: No Stress Concern Present (06/09/2021)   Received from [facility] - Occupational Stress Questionnaire    Feeling of Stress : Not at all  Social Connections: Moderately Isolated (06/09/2021)   Received from [facility]   Social Connection and Isolation Panel    In a typical week, how many times do you talk on the phone with family, friends, or neighbors?: More than three times a week    How often do you get together with friends or relatives?: More than three times a week    How often do you attend church or religious services?: 1 to 4 times per year    Do you belong to any clubs or organizations such as church groups, unions, fraternal or athletic groups, or school groups?: No    How often do you attend meetings of the clubs or organizations you belong to?: Never    Are you married, widowed, divorced, separated, never married, or living with a partner?: Never married  Intimate Partner Violence: Not At Risk (06/09/2021)   Received from [facility]   Humiliation, Afraid, Rape, and Kick questionnaire    Within the last year, have you been afraid of your partner or ex-partner?: No    Within the last year, have you been humiliated or emotionally abused in other ways by your partner or  ex-partner?: No    Within the last year, have you been kicked, hit, slapped, or otherwise physically hurt by your partner or ex-partner?: No    Within the last year, have you been raped or forced to have any kind of  sexual activity by your partner or ex-partner?: No    Past Surgical History:  Procedure Laterality Date   AV FISTULA PLACEMENT Left 10/20/2022   Procedure: ARTERIOVENOUS (AV) FISTULA CREATION ( BRACHIALCEPHALIC);  Surgeon: Jama Cordella MATSU, MD;  Location: [facility];  Service: Vascular;  Laterality: Left;   CYSTOSCOPY Bilateral 11/06/2016   Procedure: CYSTOSCOPY with stent placement;  Surgeon: Verdon Keen, MD;  Location: [facility];  Service: Gynecology;  Laterality: Bilateral;   FISTULA SUPERFICIALIZATION Left 06/29/2023   Procedure: FISTULA SUPERFICIALIZATION ( POSSIBLE TRANSPOSITION);  Surgeon: Jama Cordella MATSU, MD;  Location: [facility];  Service: Vascular;  Laterality: Left;   HYSTERECTOMY ABDOMINAL WITH SALPINGECTOMY Left 11/06/2016   Procedure: HYSTERECTOMY ABDOMINAL WITH SALPINGECTOMY;  Surgeon: Verdon Keen, MD;  Location: [facility];  Service: Gynecology;  Laterality: Left;   LEFT HEART CATH AND CORONARY ANGIOGRAPHY Right 08/01/2016   Procedure: Left Heart Cath and Coronary Angiography;  Surgeon: Denyse DELENA Bathe, MD;  Location: [facility];  Service: Cardiovascular;  Laterality: Right;   LEFT HEART CATH AND CORONARY ANGIOGRAPHY Left 04/04/2021   Procedure: LEFT HEART CATH AND CORONARY ANGIOGRAPHY; Location: [facility]; Surgeon: Fairy Ship, MD   PARATHYROIDECTOMY N/A 06/07/2020   ROTATOR CUFF REPAIR Left     Family History  Problem Relation Age of Onset   CAD Father    Breast cancer Neg Hx     Allergies  Allergen Reactions   Hydrochlorothiazide  Cough   Latex Swelling and Rash    Other reaction(s): Unknown   Shrimp [Shellfish Allergy] Anaphylaxis   Ace Inhibitors Cough       Latest Ref Rng & Units 06/18/2023    8:55 AM 10/20/2022   11:08 AM 10/10/2022     9:51 AM  CBC  WBC 4.0 - 10.5 K/uL 6.0   6.9   Hemoglobin 12.0 - 15.0 g/dL 88.4  87.0  87.7   Hematocrit 36.0 - 46.0 % 37.1  38.0  41.0   Platelets 150 - 400 K/uL 261   295       CMP     Component Value Date/Time   NA 139 06/18/2023 0855   K 3.5 06/18/2023 0855   CL 107 06/18/2023 0855   CO2 23 06/18/2023 0855   GLUCOSE 86 06/18/2023 0855   BUN 54 (H) 06/18/2023 0855   CREATININE 3.05 (H) 06/18/2023 0855   CALCIUM  9.0 06/18/2023 0855   CALCIUM  9.4 12/21/2020 1145   PROT 7.6 07/24/2020 0808   ALBUMIN 3.5 03/21/2021 1041   AST 18 07/24/2020 0808   ALT 18 07/24/2020 0808   ALKPHOS 114 07/24/2020 0808   BILITOT 0.7 07/24/2020 0808   GFRNONAA 18 (L) 06/18/2023 0855     No results found.     Assessment & Plan:   1. End stage renal disease (HCC) (Primary) Recommend:  The patient is doing well and currently has adequate dialysis access.  Although there are some parameters suggesting possible future issues.  The patient's dialysis center is not reporting any major access issues.  However, the flow rates in the patient's dialysis access are low, in the prethrombotic range, and there is no exact stenosis identified.  This raises concerns that the access is at moderate but not high risk for a problem or thrombosis and should be followed more closely  The patient will follow-up with me in the office in 3 months.  The need for a follow up duplex ultrasound will be made at that time based on whether problems with the access are persistent.  2. Essential hypertension Continue antihypertensive medications as already ordered, these medications have been reviewed and there are no changes at this time.  3. Mixed hyperlipidemia Continue statin as ordered and reviewed, no changes at this time   Current Outpatient Medications on File Prior to Visit  Medication Sig Dispense Refill   acetaminophen  (TYLENOL ) 500 MG tablet Take 2 tablets (1,000 mg total) by mouth every 6 (six) hours.  30 tablet 0   amLODipine  (NORVASC ) 10 MG tablet Take 10 mg by mouth in the morning.     aspirin  EC 81 MG tablet Take 81 mg by mouth in the morning. Swallow whole.     atorvastatin  (LIPITOR) 40 MG tablet Take 1 tablet (40 mg total) by mouth daily at 6 PM. (Patient taking differently: Take 40 mg by mouth at bedtime.) 30 tablet 2   Cholecalciferol (VITAMIN D3) 50 MCG (2000 UT) capsule Take 2,000 Units by mouth in the morning.     febuxostat (ULORIC) 40 MG tablet Take 40 mg by mouth in the morning.     furosemide (LASIX) 40 MG tablet Take 40 mg by mouth in the morning.     gabapentin  (NEURONTIN ) 300 MG capsule TAKE 1 CAPSULE BY MOUTH THREE TIMES DAILY MAY CAUSE DROWSINESS     hydrALAZINE  (APRESOLINE ) 50 MG tablet Take 50 mg by mouth 3 (three) times daily.     losartan  (COZAAR ) 100 MG tablet Take 100 mg by mouth in the morning.     metoprolol  succinate (TOPROL -XL) 50 MG 24 hr tablet Take 50 mg by mouth in the morning.     nortriptyline (PAMELOR) 10 MG capsule Take 20 mg by mouth at bedtime.     omeprazole (PRILOSEC) 20 MG capsule Take 20 mg by mouth daily before breakfast.     oxyCODONE -acetaminophen  (PERCOCET) 5-325 MG tablet Take 1-2 tablets by mouth every 6 (six) hours as needed for severe pain (pain score 7-10) or moderate pain (pain score 4-6). (Patient not taking: Reported on 12/20/2023) 34 tablet 0   No current facility-administered medications on file prior to visit.    There are no Patient Instructions on file for this visit. No follow-ups on file.   Abass Misener E Yasuo Phimmasone, NP

## 2023-12-31 DIAGNOSIS — Z992 Dependence on renal dialysis: Secondary | ICD-10-CM | POA: Diagnosis not present

## 2023-12-31 DIAGNOSIS — N186 End stage renal disease: Secondary | ICD-10-CM | POA: Diagnosis not present

## 2024-01-02 DIAGNOSIS — N186 End stage renal disease: Secondary | ICD-10-CM | POA: Diagnosis not present

## 2024-01-02 DIAGNOSIS — Z992 Dependence on renal dialysis: Secondary | ICD-10-CM | POA: Diagnosis not present

## 2024-01-04 DIAGNOSIS — N186 End stage renal disease: Secondary | ICD-10-CM | POA: Diagnosis not present

## 2024-01-04 DIAGNOSIS — Z992 Dependence on renal dialysis: Secondary | ICD-10-CM | POA: Diagnosis not present

## 2024-01-07 DIAGNOSIS — Z992 Dependence on renal dialysis: Secondary | ICD-10-CM | POA: Diagnosis not present

## 2024-01-07 DIAGNOSIS — N186 End stage renal disease: Secondary | ICD-10-CM | POA: Diagnosis not present

## 2024-01-09 DIAGNOSIS — Z992 Dependence on renal dialysis: Secondary | ICD-10-CM | POA: Diagnosis not present

## 2024-01-09 DIAGNOSIS — N186 End stage renal disease: Secondary | ICD-10-CM | POA: Diagnosis not present

## 2024-01-11 DIAGNOSIS — N186 End stage renal disease: Secondary | ICD-10-CM | POA: Diagnosis not present

## 2024-01-11 DIAGNOSIS — Z992 Dependence on renal dialysis: Secondary | ICD-10-CM | POA: Diagnosis not present

## 2024-01-14 DIAGNOSIS — N186 End stage renal disease: Secondary | ICD-10-CM | POA: Diagnosis not present

## 2024-01-14 DIAGNOSIS — Z992 Dependence on renal dialysis: Secondary | ICD-10-CM | POA: Diagnosis not present

## 2024-01-16 DIAGNOSIS — N186 End stage renal disease: Secondary | ICD-10-CM | POA: Diagnosis not present

## 2024-01-16 DIAGNOSIS — Z992 Dependence on renal dialysis: Secondary | ICD-10-CM | POA: Diagnosis not present

## 2024-01-17 DIAGNOSIS — F339 Major depressive disorder, recurrent, unspecified: Secondary | ICD-10-CM | POA: Diagnosis not present

## 2024-01-18 DIAGNOSIS — N186 End stage renal disease: Secondary | ICD-10-CM | POA: Diagnosis not present

## 2024-01-18 DIAGNOSIS — Z992 Dependence on renal dialysis: Secondary | ICD-10-CM | POA: Diagnosis not present

## 2024-01-21 DIAGNOSIS — Z992 Dependence on renal dialysis: Secondary | ICD-10-CM | POA: Diagnosis not present

## 2024-01-21 DIAGNOSIS — N186 End stage renal disease: Secondary | ICD-10-CM | POA: Diagnosis not present

## 2024-01-23 ENCOUNTER — Ambulatory Visit (INDEPENDENT_AMBULATORY_CARE_PROVIDER_SITE_OTHER)

## 2024-01-23 ENCOUNTER — Other Ambulatory Visit (INDEPENDENT_AMBULATORY_CARE_PROVIDER_SITE_OTHER): Payer: Self-pay | Admitting: Vascular Surgery

## 2024-01-23 DIAGNOSIS — N186 End stage renal disease: Secondary | ICD-10-CM

## 2024-01-23 DIAGNOSIS — Z992 Dependence on renal dialysis: Secondary | ICD-10-CM | POA: Diagnosis not present

## 2024-01-25 ENCOUNTER — Telehealth (INDEPENDENT_AMBULATORY_CARE_PROVIDER_SITE_OTHER): Payer: Self-pay

## 2024-01-25 DIAGNOSIS — Z992 Dependence on renal dialysis: Secondary | ICD-10-CM | POA: Diagnosis not present

## 2024-01-25 DIAGNOSIS — N186 End stage renal disease: Secondary | ICD-10-CM | POA: Diagnosis not present

## 2024-01-25 NOTE — Telephone Encounter (Signed)
 There's nothing really to do but to place ice on it and to elevate her arm.  They can see if she can come in earlier to see GS

## 2024-01-25 NOTE — Telephone Encounter (Signed)
 Patient left a message stating that she went to dialysis today, When the tech sticked the access the tech informed the patient that part of the vein was swollen and she was sent home with ice pack. Patient is concerned and is asking what she should do until her appointment on 01/31/24 with Dr Jama. Patient had ultrasound on 01/23/24. Please Advise

## 2024-01-25 NOTE — Telephone Encounter (Signed)
 Patient notified with medical advice and verbalized understanding. Patient was notified that the front receptionist will contact her for appt

## 2024-01-28 DIAGNOSIS — Z992 Dependence on renal dialysis: Secondary | ICD-10-CM | POA: Diagnosis not present

## 2024-01-28 DIAGNOSIS — N186 End stage renal disease: Secondary | ICD-10-CM | POA: Diagnosis not present

## 2024-01-29 DIAGNOSIS — N186 End stage renal disease: Secondary | ICD-10-CM | POA: Diagnosis not present

## 2024-01-29 DIAGNOSIS — Z992 Dependence on renal dialysis: Secondary | ICD-10-CM | POA: Diagnosis not present

## 2024-01-30 DIAGNOSIS — N186 End stage renal disease: Secondary | ICD-10-CM | POA: Diagnosis not present

## 2024-01-30 DIAGNOSIS — Z992 Dependence on renal dialysis: Secondary | ICD-10-CM | POA: Diagnosis not present

## 2024-01-31 ENCOUNTER — Ambulatory Visit (INDEPENDENT_AMBULATORY_CARE_PROVIDER_SITE_OTHER): Admitting: Nurse Practitioner

## 2024-01-31 ENCOUNTER — Encounter (INDEPENDENT_AMBULATORY_CARE_PROVIDER_SITE_OTHER): Payer: Self-pay | Admitting: Nurse Practitioner

## 2024-01-31 VITALS — BP 131/84 | HR 100 | Resp 18 | Ht 63.0 in | Wt 327.8 lb

## 2024-01-31 DIAGNOSIS — E782 Mixed hyperlipidemia: Secondary | ICD-10-CM

## 2024-01-31 DIAGNOSIS — I10 Essential (primary) hypertension: Secondary | ICD-10-CM | POA: Diagnosis not present

## 2024-01-31 DIAGNOSIS — N186 End stage renal disease: Secondary | ICD-10-CM

## 2024-01-31 MED ORDER — TRAMADOL HCL 50 MG PO TABS: 50.0000 mg | ORAL_TABLET | Freq: Four times a day (QID) | ORAL | 0 refills | Status: AC | PRN

## 2024-01-31 NOTE — H&P (View-Only) (Signed)
 Subjective:    Patient ID: [name], female    DOB: [birth year], 53 y.o.   MRN: [id number] Chief Complaint  Patient presents with   Follow-up    Follow up HDA    Patient presents today for evaluation of her dialysis access.  She presents today after having issues following an infiltration.  She notes that it is working without any significant issues.  Her previous visit she also had an infiltration that she had a hematoma.  Her most recent studies several days ago revealed that hematoma had resolved.  However several days after she had her ultrasound she was infiltrated again.  Today she has a large bruise in the area as well as what is likely a notable hematoma on her arm.  She has a flow volume of 2688      Review of Systems  Neurological:  Positive for weakness.  All other systems reviewed and are negative.      Objective:   Physical Exam Vitals reviewed.  HENT:     Head: Normocephalic.  Cardiovascular:     Rate and Rhythm: Normal rate.     Pulses: Normal pulses.  Pulmonary:     Effort: Pulmonary effort is normal.  Skin:    General: Skin is warm and dry.  Neurological:     Mental Status: She is alert and oriented to person, place, and time.     Motor: Weakness present.  Psychiatric:        Mood and Affect: Mood normal.        Behavior: Behavior normal.        Thought Content: Thought content normal.        Judgment: Judgment normal.     BP 131/84 (BP Location: Right Wrist, Patient Position: Sitting, Cuff Size: Large)   Pulse 100   Resp 18   Ht 5' 3 (1.6 m)   Wt (!) 327 lb 12.8 oz (148.7 kg)   LMP  (LMP Unknown)   BMI 58.07 kg/m   Past Medical History:  Diagnosis Date   Anemia    Anginal pain    Anxiety    a.) on BZO (diazepam) PRN   Arthritis    Cerebellar stroke (HCC)    a.) CT head 07/29/2016 --> small old infarct at posterior RIGHT cerebellum   Cholelithiasis    CKD (chronic kidney disease), stage IV (HCC)    Diastasis recti     Diastolic dysfunction 07/31/2016   a.) TTE 07/31/2016: EF 65%, mild LV dil, mod MR, G1DD   Diverticulosis    DOE (dyspnea on exertion)    Fibroid uterus    GERD (gastroesophageal reflux disease)    Heart palpitations    History of left heart catheterization 08/01/2016   a.) LHC 08/01/2016 --> normal coronaries; b.) LHC 04/04/2021: normal coronaries   Hypertension    Long term current use of aspirin     Meningioma (HCC) 07/29/2016   a.) CT head 07/29/2016:  11 x 12 x 11 mm at the high posterior RIGHT parietal region; b.) CT head 01/08/2017: stable at 12 mm   Mild cardiomegaly    NSTEMI (non-ST elevated myocardial infarction) (HCC) 07/29/2016   a.) NSTEMI 07/29/2016: troponins trended 2.54 --> 3.02 --> 3.87 --> 3.80 ng/L; b.) LHC 08/01/2016: normal coronaries   OSA (obstructive sleep apnea)    a.) does not utilize nocturnal PAP therapy   Primary hyperparathyroidism    a.) s/p parathyroidectomy 06/07/2020; parathyroid  adenoma identifed and resected   PSVT (paroxysmal  supraventricular tachycardia)    a.) holter 01/29/2023: 9 runs with the fastest lasting 5 beat   Umbilical hernia     Social History   Socioeconomic History   Marital status: Significant Other    Spouse name: Elsie   Number of children: Not on file   Years of education: Not on file   Highest education level: Not on file  Occupational History   Not on file  Tobacco Use   Smoking status: Never   Smokeless tobacco: Never  Substance and Sexual Activity   Alcohol use: No   Drug use: No   Sexual activity: Not on file  Other Topics Concern   Not on file  Social History Narrative   Not on file   Social Drivers of Health   Financial Resource Strain: Low Risk  (10/29/2023)   Received from [facility]   Overall Financial Resource Strain (CARDIA)    Difficulty of Paying Living Expenses: Not hard at all  Food Insecurity: No Food Insecurity (10/29/2023)   Received from [facility]    Hunger Vital Sign    Within the past 12 months, you worried that your food would run out before you got the money to buy more.: Never true    Within the past 12 months, the food you bought just didn't last and you didn't have money to get more.: Never true  Transportation Needs: No Transportation Needs (10/29/2023)   Received from [facility] - Transportation    In the past 12 months, has lack of transportation kept you from medical appointments or from getting medications?: No    Lack of Transportation (Non-Medical): No  Physical Activity: Inactive (06/09/2021)   Received from [facility]   Exercise Vital Sign    On average, how many days per week do you engage in moderate to strenuous exercise (like a brisk walk)?: 0 days    On average, how many minutes do you engage in exercise at this level?: 0 min  Stress: No Stress Concern Present (06/09/2021)   Received from [facility] - Occupational Stress Questionnaire    Feeling of Stress : Not at all  Social Connections: Moderately Isolated (06/09/2021)   Received from [facility]   Social Connection and Isolation Panel    In a typical week, how many times do you talk on the phone with family, friends, or neighbors?: More than three times a week    How often do you get together with friends or relatives?: More than three times a week    How often do you attend church or religious services?: 1 to 4 times per year    Do you belong to any clubs or organizations such as church groups, unions, fraternal or athletic groups, or school groups?: No    How often do you attend meetings of the clubs or organizations you belong to?: Never    Are you married, widowed, divorced, separated, never married, or living with a partner?: Never married  Intimate Partner Violence: Not At Risk (06/09/2021)   Received from [facility]   Humiliation, Afraid, Rape, and Kick questionnaire     Within the last year, have you been afraid of your partner or ex-partner?: No    Within the last year, have you been humiliated or emotionally abused in other ways by your partner or ex-partner?: No    Within the last year,  have you been kicked, hit, slapped, or otherwise physically hurt by your partner or ex-partner?: No    Within the last year, have you been raped or forced to have any kind of sexual activity by your partner or ex-partner?: No    Past Surgical History:  Procedure Laterality Date   AV FISTULA PLACEMENT Left 10/20/2022   Procedure: ARTERIOVENOUS (AV) FISTULA CREATION ( BRACHIALCEPHALIC);  Surgeon: Jama Cordella MATSU, MD;  Location: [facility];  Service: Vascular;  Laterality: Left;   CYSTOSCOPY Bilateral 11/06/2016   Procedure: CYSTOSCOPY with stent placement;  Surgeon: Verdon Keen, MD;  Location: [facility];  Service: Gynecology;  Laterality: Bilateral;   FISTULA SUPERFICIALIZATION Left 06/29/2023   Procedure: FISTULA SUPERFICIALIZATION ( POSSIBLE TRANSPOSITION);  Surgeon: Jama Cordella MATSU, MD;  Location: [facility];  Service: Vascular;  Laterality: Left;   HYSTERECTOMY ABDOMINAL WITH SALPINGECTOMY Left 11/06/2016   Procedure: HYSTERECTOMY ABDOMINAL WITH SALPINGECTOMY;  Surgeon: Verdon Keen, MD;  Location: [facility];  Service: Gynecology;  Laterality: Left;   LEFT HEART CATH AND CORONARY ANGIOGRAPHY Right 08/01/2016   Procedure: Left Heart Cath and Coronary Angiography;  Surgeon: Denyse DELENA Bathe, MD;  Location: [facility];  Service: Cardiovascular;  Laterality: Right;   LEFT HEART CATH AND CORONARY ANGIOGRAPHY Left 04/04/2021   Procedure: LEFT HEART CATH AND CORONARY ANGIOGRAPHY; Location: [facility]; Surgeon: Fairy Ship, MD   PARATHYROIDECTOMY N/A 06/07/2020   ROTATOR CUFF REPAIR Left     Family History  Problem Relation Age of Onset   CAD Father    Breast cancer Neg Hx     Allergies  Allergen Reactions   Hydrochlorothiazide  Cough   Latex Swelling and Rash     Other reaction(s): Unknown   Shrimp [Shellfish Allergy] Anaphylaxis   Ace Inhibitors Cough       Latest Ref Rng & Units 06/18/2023    8:55 AM 10/20/2022   11:08 AM 10/10/2022    9:51 AM  CBC  WBC 4.0 - 10.5 K/uL 6.0   6.9   Hemoglobin 12.0 - 15.0 g/dL 88.4  87.0  87.7   Hematocrit 36.0 - 46.0 % 37.1  38.0  41.0   Platelets 150 - 400 K/uL 261   295       CMP     Component Value Date/Time   NA 139 06/18/2023 0855   K 3.5 06/18/2023 0855   CL 107 06/18/2023 0855   CO2 23 06/18/2023 0855   GLUCOSE 86 06/18/2023 0855   BUN 54 (H) 06/18/2023 0855   CREATININE 3.05 (H) 06/18/2023 0855   CALCIUM  9.0 06/18/2023 0855   CALCIUM  9.4 12/21/2020 1145   PROT 7.6 07/24/2020 0808   ALBUMIN 3.5 03/21/2021 1041   AST 18 07/24/2020 0808   ALT 18 07/24/2020 0808   ALKPHOS 114 07/24/2020 0808   BILITOT 0.7 07/24/2020 0808   GFRNONAA 18 (L) 06/18/2023 0855     No results found.     Assessment & Plan:   1. End stage renal disease (HCC) (Primary) Recommend:  The patient is experiencing increasing problems with their dialysis access.  She continues to have episodes of infiltration.  She does have a pulsatile fistula and so I am concerned that some of this may be related to a stenosis not seen on ultrasound.  In addition there is some concern that her fistula may be too deep which is causing her continued issues.  This can be evaluated during fistulogram as well.  Patient should have a fistulagram with the intention for intervention.  The intention for intervention is to restore appropriate flow and prevent thrombosis and possible loss of the access.  As well as improve the quality of dialysis therapy.  The risks, benefits and alternative therapies were reviewed in detail with the patient.  All questions were answered.  The patient agrees to proceed with angio/intervention.    The patient will follow up with me in the office after the procedure.   2. Essential hypertension Continue  antihypertensive medications as already ordered, these medications have been reviewed and there are no changes at this time.  3. Mixed hyperlipidemia Continue statin as ordered and reviewed, no changes at this time   Current Outpatient Medications on File Prior to Visit  Medication Sig Dispense Refill   acetaminophen  (TYLENOL ) 500 MG tablet Take 2 tablets (1,000 mg total) by mouth every 6 (six) hours. 30 tablet 0   amLODipine  (NORVASC ) 10 MG tablet Take 10 mg by mouth in the morning.     aspirin  EC 81 MG tablet Take 81 mg by mouth in the morning. Swallow whole.     atorvastatin  (LIPITOR) 40 MG tablet Take 1 tablet (40 mg total) by mouth daily at 6 PM. (Patient taking differently: Take 40 mg by mouth at bedtime.) 30 tablet 2   Cholecalciferol (VITAMIN D3) 50 MCG (2000 UT) capsule Take 2,000 Units by mouth in the morning.     febuxostat (ULORIC) 40 MG tablet Take 40 mg by mouth in the morning.     furosemide (LASIX) 40 MG tablet Take 40 mg by mouth in the morning.     gabapentin  (NEURONTIN ) 300 MG capsule TAKE 1 CAPSULE BY MOUTH THREE TIMES DAILY MAY CAUSE DROWSINESS     hydrALAZINE  (APRESOLINE ) 50 MG tablet Take 50 mg by mouth 3 (three) times daily.     losartan  (COZAAR ) 100 MG tablet Take 100 mg by mouth in the morning.     metoprolol  succinate (TOPROL -XL) 50 MG 24 hr tablet Take 50 mg by mouth in the morning.     nortriptyline (PAMELOR) 10 MG capsule Take 20 mg by mouth at bedtime.     omeprazole (PRILOSEC) 20 MG capsule Take 20 mg by mouth daily before breakfast.     No current facility-administered medications on file prior to visit.    There are no Patient Instructions on file for this visit. No follow-ups on file.   Antia Rahal E Fusako Tanabe, NP

## 2024-01-31 NOTE — Progress Notes (Signed)
 Subjective:    Patient ID: [name], female    DOB: [birth year], 53 y.o.   MRN: [id number] Chief Complaint  Patient presents with   Follow-up    Follow up HDA    Patient presents today for evaluation of her dialysis access.  She presents today after having issues following an infiltration.  She notes that it is working without any significant issues.  Her previous visit she also had an infiltration that she had a hematoma.  Her most recent studies several days ago revealed that hematoma had resolved.  However several days after she had her ultrasound she was infiltrated again.  Today she has a large bruise in the area as well as what is likely a notable hematoma on her arm.  She has a flow volume of 2688      Review of Systems  Neurological:  Positive for weakness.  All other systems reviewed and are negative.      Objective:   Physical Exam Vitals reviewed.  HENT:     Head: Normocephalic.  Cardiovascular:     Rate and Rhythm: Normal rate.     Pulses: Normal pulses.  Pulmonary:     Effort: Pulmonary effort is normal.  Skin:    General: Skin is warm and dry.  Neurological:     Mental Status: She is alert and oriented to person, place, and time.     Motor: Weakness present.  Psychiatric:        Mood and Affect: Mood normal.        Behavior: Behavior normal.        Thought Content: Thought content normal.        Judgment: Judgment normal.     BP 131/84 (BP Location: Right Wrist, Patient Position: Sitting, Cuff Size: Large)   Pulse 100   Resp 18   Ht 5' 3 (1.6 m)   Wt (!) 327 lb 12.8 oz (148.7 kg)   LMP  (LMP Unknown)   BMI 58.07 kg/m   Past Medical History:  Diagnosis Date   Anemia    Anginal pain    Anxiety    a.) on BZO (diazepam) PRN   Arthritis    Cerebellar stroke (HCC)    a.) CT head 07/29/2016 --> small old infarct at posterior RIGHT cerebellum   Cholelithiasis    CKD (chronic kidney disease), stage IV (HCC)    Diastasis recti     Diastolic dysfunction 07/31/2016   a.) TTE 07/31/2016: EF 65%, mild LV dil, mod MR, G1DD   Diverticulosis    DOE (dyspnea on exertion)    Fibroid uterus    GERD (gastroesophageal reflux disease)    Heart palpitations    History of left heart catheterization 08/01/2016   a.) LHC 08/01/2016 --> normal coronaries; b.) LHC 04/04/2021: normal coronaries   Hypertension    Long term current use of aspirin     Meningioma (HCC) 07/29/2016   a.) CT head 07/29/2016:  11 x 12 x 11 mm at the high posterior RIGHT parietal region; b.) CT head 01/08/2017: stable at 12 mm   Mild cardiomegaly    NSTEMI (non-ST elevated myocardial infarction) (HCC) 07/29/2016   a.) NSTEMI 07/29/2016: troponins trended 2.54 --> 3.02 --> 3.87 --> 3.80 ng/L; b.) LHC 08/01/2016: normal coronaries   OSA (obstructive sleep apnea)    a.) does not utilize nocturnal PAP therapy   Primary hyperparathyroidism    a.) s/p parathyroidectomy 06/07/2020; parathyroid  adenoma identifed and resected   PSVT (paroxysmal  supraventricular tachycardia)    a.) holter 01/29/2023: 9 runs with the fastest lasting 5 beat   Umbilical hernia     Social History   Socioeconomic History   Marital status: Significant Other    Spouse name: Elsie   Number of children: Not on file   Years of education: Not on file   Highest education level: Not on file  Occupational History   Not on file  Tobacco Use   Smoking status: Never   Smokeless tobacco: Never  Substance and Sexual Activity   Alcohol use: No   Drug use: No   Sexual activity: Not on file  Other Topics Concern   Not on file  Social History Narrative   Not on file   Social Drivers of Health   Financial Resource Strain: Low Risk  (10/29/2023)   Received from [facility]   Overall Financial Resource Strain (CARDIA)    Difficulty of Paying Living Expenses: Not hard at all  Food Insecurity: No Food Insecurity (10/29/2023)   Received from [facility]    Hunger Vital Sign    Within the past 12 months, you worried that your food would run out before you got the money to buy more.: Never true    Within the past 12 months, the food you bought just didn't last and you didn't have money to get more.: Never true  Transportation Needs: No Transportation Needs (10/29/2023)   Received from [facility] - Transportation    In the past 12 months, has lack of transportation kept you from medical appointments or from getting medications?: No    Lack of Transportation (Non-Medical): No  Physical Activity: Inactive (06/09/2021)   Received from [facility]   Exercise Vital Sign    On average, how many days per week do you engage in moderate to strenuous exercise (like a brisk walk)?: 0 days    On average, how many minutes do you engage in exercise at this level?: 0 min  Stress: No Stress Concern Present (06/09/2021)   Received from [facility] - Occupational Stress Questionnaire    Feeling of Stress : Not at all  Social Connections: Moderately Isolated (06/09/2021)   Received from [facility]   Social Connection and Isolation Panel    In a typical week, how many times do you talk on the phone with family, friends, or neighbors?: More than three times a week    How often do you get together with friends or relatives?: More than three times a week    How often do you attend church or religious services?: 1 to 4 times per year    Do you belong to any clubs or organizations such as church groups, unions, fraternal or athletic groups, or school groups?: No    How often do you attend meetings of the clubs or organizations you belong to?: Never    Are you married, widowed, divorced, separated, never married, or living with a partner?: Never married  Intimate Partner Violence: Not At Risk (06/09/2021)   Received from [facility]   Humiliation, Afraid, Rape, and Kick questionnaire     Within the last year, have you been afraid of your partner or ex-partner?: No    Within the last year, have you been humiliated or emotionally abused in other ways by your partner or ex-partner?: No    Within the last year,  have you been kicked, hit, slapped, or otherwise physically hurt by your partner or ex-partner?: No    Within the last year, have you been raped or forced to have any kind of sexual activity by your partner or ex-partner?: No    Past Surgical History:  Procedure Laterality Date   AV FISTULA PLACEMENT Left 10/20/2022   Procedure: ARTERIOVENOUS (AV) FISTULA CREATION ( BRACHIALCEPHALIC);  Surgeon: Jama Cordella MATSU, MD;  Location: [facility];  Service: Vascular;  Laterality: Left;   CYSTOSCOPY Bilateral 11/06/2016   Procedure: CYSTOSCOPY with stent placement;  Surgeon: Verdon Keen, MD;  Location: [facility];  Service: Gynecology;  Laterality: Bilateral;   FISTULA SUPERFICIALIZATION Left 06/29/2023   Procedure: FISTULA SUPERFICIALIZATION ( POSSIBLE TRANSPOSITION);  Surgeon: Jama Cordella MATSU, MD;  Location: [facility];  Service: Vascular;  Laterality: Left;   HYSTERECTOMY ABDOMINAL WITH SALPINGECTOMY Left 11/06/2016   Procedure: HYSTERECTOMY ABDOMINAL WITH SALPINGECTOMY;  Surgeon: Verdon Keen, MD;  Location: [facility];  Service: Gynecology;  Laterality: Left;   LEFT HEART CATH AND CORONARY ANGIOGRAPHY Right 08/01/2016   Procedure: Left Heart Cath and Coronary Angiography;  Surgeon: Denyse DELENA Bathe, MD;  Location: [facility];  Service: Cardiovascular;  Laterality: Right;   LEFT HEART CATH AND CORONARY ANGIOGRAPHY Left 04/04/2021   Procedure: LEFT HEART CATH AND CORONARY ANGIOGRAPHY; Location: [facility]; Surgeon: Fairy Ship, MD   PARATHYROIDECTOMY N/A 06/07/2020   ROTATOR CUFF REPAIR Left     Family History  Problem Relation Age of Onset   CAD Father    Breast cancer Neg Hx     Allergies  Allergen Reactions   Hydrochlorothiazide  Cough   Latex Swelling and Rash     Other reaction(s): Unknown   Shrimp [Shellfish Allergy] Anaphylaxis   Ace Inhibitors Cough       Latest Ref Rng & Units 06/18/2023    8:55 AM 10/20/2022   11:08 AM 10/10/2022    9:51 AM  CBC  WBC 4.0 - 10.5 K/uL 6.0   6.9   Hemoglobin 12.0 - 15.0 g/dL 88.4  87.0  87.7   Hematocrit 36.0 - 46.0 % 37.1  38.0  41.0   Platelets 150 - 400 K/uL 261   295       CMP     Component Value Date/Time   NA 139 06/18/2023 0855   K 3.5 06/18/2023 0855   CL 107 06/18/2023 0855   CO2 23 06/18/2023 0855   GLUCOSE 86 06/18/2023 0855   BUN 54 (H) 06/18/2023 0855   CREATININE 3.05 (H) 06/18/2023 0855   CALCIUM  9.0 06/18/2023 0855   CALCIUM  9.4 12/21/2020 1145   PROT 7.6 07/24/2020 0808   ALBUMIN 3.5 03/21/2021 1041   AST 18 07/24/2020 0808   ALT 18 07/24/2020 0808   ALKPHOS 114 07/24/2020 0808   BILITOT 0.7 07/24/2020 0808   GFRNONAA 18 (L) 06/18/2023 0855     No results found.     Assessment & Plan:   1. End stage renal disease (HCC) (Primary) Recommend:  The patient is experiencing increasing problems with their dialysis access.  She continues to have episodes of infiltration.  She does have a pulsatile fistula and so I am concerned that some of this may be related to a stenosis not seen on ultrasound.  In addition there is some concern that her fistula may be too deep which is causing her continued issues.  This can be evaluated during fistulogram as well.  Patient should have a fistulagram with the intention for intervention.  The intention for intervention is to restore appropriate flow and prevent thrombosis and possible loss of the access.  As well as improve the quality of dialysis therapy.  The risks, benefits and alternative therapies were reviewed in detail with the patient.  All questions were answered.  The patient agrees to proceed with angio/intervention.    The patient will follow up with me in the office after the procedure.   2. Essential hypertension Continue  antihypertensive medications as already ordered, these medications have been reviewed and there are no changes at this time.  3. Mixed hyperlipidemia Continue statin as ordered and reviewed, no changes at this time   Current Outpatient Medications on File Prior to Visit  Medication Sig Dispense Refill   acetaminophen  (TYLENOL ) 500 MG tablet Take 2 tablets (1,000 mg total) by mouth every 6 (six) hours. 30 tablet 0   amLODipine  (NORVASC ) 10 MG tablet Take 10 mg by mouth in the morning.     aspirin  EC 81 MG tablet Take 81 mg by mouth in the morning. Swallow whole.     atorvastatin  (LIPITOR) 40 MG tablet Take 1 tablet (40 mg total) by mouth daily at 6 PM. (Patient taking differently: Take 40 mg by mouth at bedtime.) 30 tablet 2   Cholecalciferol (VITAMIN D3) 50 MCG (2000 UT) capsule Take 2,000 Units by mouth in the morning.     febuxostat (ULORIC) 40 MG tablet Take 40 mg by mouth in the morning.     furosemide (LASIX) 40 MG tablet Take 40 mg by mouth in the morning.     gabapentin  (NEURONTIN ) 300 MG capsule TAKE 1 CAPSULE BY MOUTH THREE TIMES DAILY MAY CAUSE DROWSINESS     hydrALAZINE  (APRESOLINE ) 50 MG tablet Take 50 mg by mouth 3 (three) times daily.     losartan  (COZAAR ) 100 MG tablet Take 100 mg by mouth in the morning.     metoprolol  succinate (TOPROL -XL) 50 MG 24 hr tablet Take 50 mg by mouth in the morning.     nortriptyline (PAMELOR) 10 MG capsule Take 20 mg by mouth at bedtime.     omeprazole (PRILOSEC) 20 MG capsule Take 20 mg by mouth daily before breakfast.     No current facility-administered medications on file prior to visit.    There are no Patient Instructions on file for this visit. No follow-ups on file.   Antia Rahal E Fusako Tanabe, NP

## 2024-02-04 ENCOUNTER — Telehealth (INDEPENDENT_AMBULATORY_CARE_PROVIDER_SITE_OTHER): Payer: Self-pay

## 2024-02-04 NOTE — Telephone Encounter (Signed)
 Spoke with the patient and she is scheduled with Dr. Jama for a left arm fistulagram with a 2:00 pm arrival time to the [facility]. Pre-procedure instructions were discussed and will be sent to Mychart and mailed.

## 2024-02-06 DIAGNOSIS — Z992 Dependence on renal dialysis: Secondary | ICD-10-CM | POA: Diagnosis not present

## 2024-02-06 DIAGNOSIS — N186 End stage renal disease: Secondary | ICD-10-CM | POA: Diagnosis not present

## 2024-02-08 DIAGNOSIS — Z992 Dependence on renal dialysis: Secondary | ICD-10-CM | POA: Diagnosis not present

## 2024-02-11 DIAGNOSIS — Z992 Dependence on renal dialysis: Secondary | ICD-10-CM | POA: Diagnosis not present

## 2024-02-11 DIAGNOSIS — N186 End stage renal disease: Secondary | ICD-10-CM | POA: Diagnosis not present

## 2024-02-12 ENCOUNTER — Encounter: Payer: Self-pay | Admitting: Vascular Surgery

## 2024-02-12 ENCOUNTER — Other Ambulatory Visit: Payer: Self-pay

## 2024-02-12 ENCOUNTER — Encounter
Admission: RE | Disposition: A | Discharge status: Home / Self Care | Payer: Self-pay | Source: Home / Self Care | Attending: Vascular Surgery

## 2024-02-12 ENCOUNTER — Ambulatory Visit
Admission: RE | Admit: 2024-02-12 | Discharge: 2024-02-12 | Disposition: A | Discharge status: Home / Self Care | Attending: Vascular Surgery | Admitting: Vascular Surgery

## 2024-02-12 DIAGNOSIS — E782 Mixed hyperlipidemia: Secondary | ICD-10-CM | POA: Insufficient documentation

## 2024-02-12 DIAGNOSIS — T82858A Stenosis of vascular prosthetic devices, implants and grafts, initial encounter: Secondary | ICD-10-CM | POA: Diagnosis not present

## 2024-02-12 DIAGNOSIS — T82898A Other specified complication of vascular prosthetic devices, implants and grafts, initial encounter: Secondary | ICD-10-CM | POA: Insufficient documentation

## 2024-02-12 DIAGNOSIS — Z79899 Other long term (current) drug therapy: Secondary | ICD-10-CM | POA: Insufficient documentation

## 2024-02-12 DIAGNOSIS — I120 Hypertensive chronic kidney disease with stage 5 chronic kidney disease or end stage renal disease: Secondary | ICD-10-CM | POA: Diagnosis not present

## 2024-02-12 DIAGNOSIS — Z992 Dependence on renal dialysis: Secondary | ICD-10-CM

## 2024-02-12 DIAGNOSIS — N186 End stage renal disease: Secondary | ICD-10-CM | POA: Diagnosis not present

## 2024-02-12 DIAGNOSIS — Y832 Surgical operation with anastomosis, bypass or graft as the cause of abnormal reaction of the patient, or of later complication, without mention of misadventure at the time of the procedure: Secondary | ICD-10-CM | POA: Diagnosis not present

## 2024-02-12 HISTORY — PX: A/V FISTULAGRAM: CATH118298

## 2024-02-12 LAB — POTASSIUM (ARMC VASCULAR LAB ONLY): Potassium (ARMC vascular lab): 3.7 mmol/L (ref 3.5–5.1)

## 2024-02-12 SURGERY — A/V FISTULAGRAM
Anesthesia: Moderate Sedation | Laterality: Left

## 2024-02-12 MED ORDER — METHYLPREDNISOLONE SODIUM SUCC 125 MG IJ SOLR
INTRAMUSCULAR | Status: AC
  Filled 2024-02-12: qty 2

## 2024-02-12 MED ORDER — CEFAZOLIN SODIUM-DEXTROSE 2-4 GM/100ML-% IV SOLN: 2.0000 g | INTRAVENOUS | Status: DC

## 2024-02-12 MED ORDER — IODIXANOL 320 MG/ML IV SOLN
INTRAVENOUS | Status: DC | PRN
  Administered 2024-02-12: 25 mL

## 2024-02-12 MED ORDER — FAMOTIDINE 20 MG PO TABS
40.0000 mg | ORAL_TABLET | Freq: Once | ORAL | Status: AC | PRN
  Administered 2024-02-12: 40 mg via ORAL

## 2024-02-12 MED ORDER — MIDAZOLAM HCL 2 MG/ML PO SYRP: 8.0000 mg | ORAL_SOLUTION | Freq: Once | ORAL | Status: DC | PRN

## 2024-02-12 MED ORDER — HEPARIN SODIUM (PORCINE) 1000 UNIT/ML IJ SOLN
INTRAMUSCULAR | Status: AC
  Filled 2024-02-12: qty 10

## 2024-02-12 MED ORDER — FAMOTIDINE 20 MG PO TABS
ORAL_TABLET | ORAL | Status: AC
  Filled 2024-02-12: qty 2

## 2024-02-12 MED ORDER — LIDOCAINE HCL (PF) 1 % IJ SOLN
INTRAMUSCULAR | Status: DC | PRN
  Administered 2024-02-12: 10 mL

## 2024-02-12 MED ORDER — HEPARIN SODIUM (PORCINE) 1000 UNIT/ML IJ SOLN
INTRAMUSCULAR | Status: DC | PRN
  Administered 2024-02-12: 3000 [IU] via INTRAVENOUS

## 2024-02-12 MED ORDER — FENTANYL CITRATE (PF) 50 MCG/ML IJ SOSY
PREFILLED_SYRINGE | INTRAMUSCULAR | Status: AC
  Filled 2024-02-12: qty 1

## 2024-02-12 MED ORDER — FENTANYL CITRATE (PF) 100 MCG/2ML IJ SOLN
INTRAMUSCULAR | Status: DC | PRN
  Administered 2024-02-12: 50 ug via INTRAVENOUS

## 2024-02-12 MED ORDER — MIDAZOLAM HCL 2 MG/2ML IJ SOLN
INTRAMUSCULAR | Status: AC
  Filled 2024-02-12: qty 2

## 2024-02-12 MED ORDER — ONDANSETRON HCL 4 MG/2ML IJ SOLN: 4.0000 mg | Freq: Four times a day (QID) | INTRAMUSCULAR | Status: DC | PRN

## 2024-02-12 MED ORDER — HEPARIN (PORCINE) IN NACL 1000-0.9 UT/500ML-% IV SOLN
INTRAVENOUS | Status: DC | PRN
  Administered 2024-02-12: 500 mL

## 2024-02-12 MED ORDER — HYDROMORPHONE HCL 1 MG/ML IJ SOLN: 1.0000 mg | Freq: Once | INTRAMUSCULAR | Status: DC | PRN

## 2024-02-12 MED ORDER — DIPHENHYDRAMINE HCL 50 MG/ML IJ SOLN
50.0000 mg | Freq: Once | INTRAMUSCULAR | Status: AC | PRN
  Administered 2024-02-12: 50 mg via INTRAVENOUS

## 2024-02-12 MED ORDER — MIDAZOLAM HCL 2 MG/2ML IJ SOLN
INTRAMUSCULAR | Status: DC | PRN
  Administered 2024-02-12: 2 mg via INTRAVENOUS

## 2024-02-12 MED ORDER — CEFAZOLIN SODIUM-DEXTROSE 1-4 GM/50ML-% IV SOLN
1.0000 g | INTRAVENOUS | Status: AC
  Administered 2024-02-12: 1 g via INTRAVENOUS

## 2024-02-12 MED ORDER — METHYLPREDNISOLONE SODIUM SUCC 125 MG IJ SOLR
125.0000 mg | Freq: Once | INTRAMUSCULAR | Status: AC | PRN
  Administered 2024-02-12: 125 mg via INTRAVENOUS

## 2024-02-12 MED ORDER — SODIUM CHLORIDE 0.9 % IV SOLN: INTRAVENOUS | Status: DC

## 2024-02-12 MED ORDER — DIPHENHYDRAMINE HCL 50 MG/ML IJ SOLN
INTRAMUSCULAR | Status: AC
  Filled 2024-02-12: qty 1

## 2024-02-12 MED ORDER — CEFAZOLIN SODIUM-DEXTROSE 1-4 GM/50ML-% IV SOLN
INTRAVENOUS | Status: AC
  Filled 2024-02-12: qty 50

## 2024-02-12 SURGICAL SUPPLY — 14 items
BALLOON ATG 12X4X80 (BALLOONS) IMPLANT
BALLOON LUTONIX AV 8X60X75 (BALLOONS) IMPLANT
COVER PROBE ULTRASOUND 5X96 (MISCELLANEOUS) IMPLANT
DEVICE PRESTO INFLATION (MISCELLANEOUS) IMPLANT
DRAPE BRACHIAL (DRAPES) IMPLANT
GOWN STRL REUS W/ TWL LRG LVL3 (GOWN DISPOSABLE) ×1 IMPLANT
NDL ENTRY 21GA 7CM ECHOTIP (NEEDLE) IMPLANT
NEEDLE ENTRY 21GA 7CM ECHOTIP (NEEDLE) ×3 IMPLANT
PACK ANGIOGRAPHY (CUSTOM PROCEDURE TRAY) ×1 IMPLANT
SET INTRO CAPELLA COAXIAL (SET/KITS/TRAYS/PACK) IMPLANT
SHEATH BRITE TIP 6FRX5.5 (SHEATH) IMPLANT
SHEATH BRITE TIP 7FRX5.5 (SHEATH) IMPLANT
SUT MNCRL AB 4-0 PS2 18 (SUTURE) IMPLANT
WIRE SUPRACORE 190CM (WIRE) IMPLANT

## 2024-02-12 NOTE — Discharge Instructions (Signed)
 Fistulogram, Care After   Refer to this sheet in the next few weeks. These instructions provide you with information on caring for yourself after your procedure. Your health care provider may also give you more specific instructions. Your treatment has been planned according to current medical practices, but problems sometimes occur. Call your health care provider if you have any problems or questions after your procedure.  What can I expect after the procedure? After your procedure, it is typical to have the following: A small amount of discomfort in the area where the catheters were placed. A small amount of bruising around the fistula. Sleepiness and fatigue.  Follow these instructions at home: Rest at home for the day following your procedure. Do not drive or operate heavy machinery while taking pain medicine. Take medicines only as directed by your health care provider. Do not take baths, swim, or use a hot tub until your health care provider approves. You may shower 24 hours after the procedure or as directed by your health care provider. There are many different ways to close and cover an incision, including stitches, skin glue, and adhesive strips. Follow your health care provider's instructions on: Incision care. Bandage (dressing) changes and removal. Incision closure removal. Monitor your dialysis fistula carefully.  Contact a health care provider if: You have drainage, redness, swelling, or pain at your catheter site. You have a fever. You have chills.  Get help right away if: You feel weak. You have trouble balancing. You have trouble moving your arms or legs. You have problems with your speech or vision. You can no longer feel a vibration or buzz when you put your fingers over your dialysis fistula. The limb that was used for the procedure: Swells. Is painful. Is cold. Is discolored, such as blue or pale white.  This information is not intended to replace advice  given to you by your health care provider. Make sure you discuss any questions you have with your health care provider. Document Released: 09/01/2013 Document Revised: 09/23/2015 Document Reviewed: 06/06/2013 Elsevier Interactive Patient Education  2018 [employer].

## 2024-02-12 NOTE — Op Note (Addendum)
 OPERATIVE NOTE   PROCEDURE: Contrast injection left arm brachiocephalic AV access Percutaneous transluminal angioplasty to 12 mm midportion left arm brachiocephalic fistula peripheral segment. Ultrasound-guided access for placement of IV right arm  PRE-OPERATIVE DIAGNOSIS: Complication of dialysis access                                                       End Stage Renal Disease  POST-OPERATIVE DIAGNOSIS: same as above   SURGEON: Cordella JUDITHANN Shawl, M.D.  ANESTHESIA: Conscious sedation was administered under my direct supervision by the interventional radiology RN. IV Versed  plus fentanyl  were utilized. Continuous ECG, pulse oximetry and blood pressure was monitored throughout the entire procedure.  Conscious sedation was for a total of 25 minutes.  ESTIMATED BLOOD LOSS: minimal  FINDING(S): Stricture of the AV graft  SPECIMEN(S):  None  CONTRAST: 25 cc  FLUOROSCOPY TIME: 0.6 minutes  INDICATIONS: [name] is a 53 y.o. female who  presents with malfunctioning left arm AV access.  The patient is scheduled for angiography with possible intervention of the AV access.  The patient is aware the risks include but are not limited to: bleeding, infection, thrombosis of the cannulated access, and possible anaphylactic reaction to the contrast.  The patient acknowledges if the access can not be salvaged a tunneled catheter will be needed and will be placed during this procedure.  The patient is aware of the risks of the procedure and elects to proceed with the angiogram and intervention.  DESCRIPTION: After full informed written consent was obtained, the patient was brought back to the Special Procedure suite and placed supine position.  The patient was not able to achieve an IV in the preoperative area.  Ultrasound is placed in the sterile sleeve and the right arm is prepped and draped in a sterile fashion.  Ultrasound is utilized to identify a vein in the region of the  antecubital fossa.  It is echolucent and compressible indicating patency.  Images were recorded for the permanent record.  Under direct ultrasound visualization a microneedle was inserted into the vein followed by microwire.  A microsheath is then advanced without difficulty.  It is flushed with heparinized saline and secured using a Tegaderm.  Patient is now administered the prophylaxis for her dye allergy as well as sedation.  Appropriate cardiopulmonary monitors were placed.    The left arm was prepped and draped in the standard fashion.  Appropriate timeout is called. The left brachiocephalic fistula was cannulated with a micropuncture needle.  Cannulation was performed with ultrasound guidance. Ultrasound was placed in a sterile sleeve, the AV access was interrogated and noted to be echolucent and compressible indicating patency. Image was recorded for the permanent record. The puncture is performed under continuous ultrasound visualization.   The microwire was advanced and the needle was exchanged for  a microsheath.  The J-wire was then advanced and a 6 Fr sheath inserted.  Hand injections were completed to image the access from the arterial anastomosis through the entire access.  The central venous structures were also imaged by hand injections.  Based on the images,  3000 units of heparin  was given and a wire was negotiated through the strictures within the venous portion of the graft.  An 8 mm x 60 mm Lutonix drug-eluting balloon was used.  Inflation was to 10  atm for 1 minute.  Follow-up imaging demonstrated minimal improvement and a 12 mm x 40 mm Atlas balloon was advanced across the lesion and inflated to 16 to 18 atm for 1 minute.  Follow-up imaging demonstrated less than 10% residual stenosis with rapid flow of contrast and a marked improvement in the midportion of the fistula.  Follow-up imaging demonstrates complete resolution of the stricture with rapid flow of contrast through the graft, the  central venous anatomy is preserved.  A 4-0 Monocryl purse-string suture was sewn around the sheath.  The sheath was removed and light pressure was applied.  A sterile bandage was applied to the puncture site.    COMPLICATIONS: None  CONDITION: Metta Cordella JUDITHANN Jama, M.D [facility] Office: [phone]  02/12/2024 4:38 PM

## 2024-02-12 NOTE — Interval H&P Note (Signed)
 History and Physical Interval Note:  02/12/2024 3:49 PM  [name]  has presented today for surgery, with the diagnosis of L Arm Fistulagram    End Stage Renal.  The various methods of treatment have been discussed with the patient and family. After consideration of risks, benefits and other options for treatment, the patient has consented to  Procedure(s): A/V Fistulagram (Left) as a surgical intervention.  The patient's history has been reviewed, patient examined, no change in status, stable for surgery.  I have reviewed the patient's chart and labs.  Questions were answered to the patient's satisfaction.     Cordella Shawl

## 2024-02-13 ENCOUNTER — Encounter: Payer: Self-pay | Admitting: Vascular Surgery

## 2024-02-13 DIAGNOSIS — Z992 Dependence on renal dialysis: Secondary | ICD-10-CM | POA: Diagnosis not present

## 2024-02-13 DIAGNOSIS — N186 End stage renal disease: Secondary | ICD-10-CM | POA: Diagnosis not present

## 2024-02-15 DIAGNOSIS — N186 End stage renal disease: Secondary | ICD-10-CM | POA: Diagnosis not present

## 2024-02-15 DIAGNOSIS — R32 Unspecified urinary incontinence: Secondary | ICD-10-CM | POA: Diagnosis not present

## 2024-02-20 DIAGNOSIS — N186 End stage renal disease: Secondary | ICD-10-CM | POA: Diagnosis not present

## 2024-02-22 DIAGNOSIS — Z992 Dependence on renal dialysis: Secondary | ICD-10-CM | POA: Diagnosis not present

## 2024-02-22 DIAGNOSIS — N186 End stage renal disease: Secondary | ICD-10-CM | POA: Diagnosis not present

## 2024-02-29 DIAGNOSIS — Z992 Dependence on renal dialysis: Secondary | ICD-10-CM | POA: Diagnosis not present

## 2024-02-29 DIAGNOSIS — N186 End stage renal disease: Secondary | ICD-10-CM | POA: Diagnosis not present

## 2024-03-03 DIAGNOSIS — Z992 Dependence on renal dialysis: Secondary | ICD-10-CM | POA: Diagnosis not present

## 2024-03-03 DIAGNOSIS — N186 End stage renal disease: Secondary | ICD-10-CM | POA: Diagnosis not present

## 2024-03-07 DIAGNOSIS — Z992 Dependence on renal dialysis: Secondary | ICD-10-CM | POA: Diagnosis not present

## 2024-03-07 DIAGNOSIS — N186 End stage renal disease: Secondary | ICD-10-CM | POA: Diagnosis not present

## 2024-03-10 DIAGNOSIS — Z992 Dependence on renal dialysis: Secondary | ICD-10-CM | POA: Diagnosis not present

## 2024-03-10 DIAGNOSIS — N186 End stage renal disease: Secondary | ICD-10-CM | POA: Diagnosis not present

## 2024-03-13 ENCOUNTER — Encounter (INDEPENDENT_AMBULATORY_CARE_PROVIDER_SITE_OTHER)

## 2024-03-13 ENCOUNTER — Ambulatory Visit (INDEPENDENT_AMBULATORY_CARE_PROVIDER_SITE_OTHER): Admitting: Vascular Surgery

## 2024-03-14 ENCOUNTER — Encounter: Payer: Self-pay | Admitting: Emergency Medicine

## 2024-03-14 ENCOUNTER — Other Ambulatory Visit: Payer: Self-pay

## 2024-03-14 ENCOUNTER — Emergency Department
Admission: EM | Admit: 2024-03-14 | Discharge: 2024-03-14 | Disposition: A | Discharge status: Home / Self Care | Attending: Emergency Medicine | Admitting: Emergency Medicine

## 2024-03-14 DIAGNOSIS — T8249XA Other complication of vascular dialysis catheter, initial encounter: Secondary | ICD-10-CM | POA: Diagnosis not present

## 2024-03-14 DIAGNOSIS — R42 Dizziness and giddiness: Secondary | ICD-10-CM | POA: Diagnosis not present

## 2024-03-14 DIAGNOSIS — Z992 Dependence on renal dialysis: Secondary | ICD-10-CM | POA: Insufficient documentation

## 2024-03-14 DIAGNOSIS — I120 Hypertensive chronic kidney disease with stage 5 chronic kidney disease or end stage renal disease: Secondary | ICD-10-CM | POA: Diagnosis not present

## 2024-03-14 DIAGNOSIS — I132 Hypertensive heart and chronic kidney disease with heart failure and with stage 5 chronic kidney disease, or end stage renal disease: Secondary | ICD-10-CM | POA: Diagnosis not present

## 2024-03-14 DIAGNOSIS — I509 Heart failure, unspecified: Secondary | ICD-10-CM | POA: Diagnosis not present

## 2024-03-14 DIAGNOSIS — T829XXA Unspecified complication of cardiac and vascular prosthetic device, implant and graft, initial encounter: Secondary | ICD-10-CM

## 2024-03-14 DIAGNOSIS — N186 End stage renal disease: Secondary | ICD-10-CM | POA: Insufficient documentation

## 2024-03-14 LAB — CBC WITH DIFFERENTIAL/PLATELET
Abs Immature Granulocytes: 0.05 K/uL (ref 0.00–0.07)
Basophils Absolute: 0 K/uL (ref 0.0–0.1)
Basophils Relative: 0 %
Eosinophils Absolute: 0.1 K/uL (ref 0.0–0.5)
Eosinophils Relative: 1 %
HCT: 36.6 % (ref 36.0–46.0)
Hemoglobin: 11.1 g/dL — ABNORMAL LOW (ref 12.0–15.0)
Immature Granulocytes: 1 %
Lymphocytes Relative: 22 %
Lymphs Abs: 1.8 K/uL (ref 0.7–4.0)
MCH: 28.5 pg (ref 26.0–34.0)
MCHC: 30.3 g/dL (ref 30.0–36.0)
MCV: 93.8 fL (ref 80.0–100.0)
Monocytes Absolute: 0.7 K/uL (ref 0.1–1.0)
Monocytes Relative: 8 %
Neutro Abs: 5.4 K/uL (ref 1.7–7.7)
Neutrophils Relative %: 68 %
Platelets: 305 K/uL (ref 150–400)
RBC: 3.9 MIL/uL (ref 3.87–5.11)
RDW: 16.4 % — ABNORMAL HIGH (ref 11.5–15.5)
WBC: 8 K/uL (ref 4.0–10.5)
nRBC: 0 % (ref 0.0–0.2)

## 2024-03-14 LAB — TYPE AND SCREEN
ABO/RH(D): O POS
Antibody Screen: POSITIVE

## 2024-03-14 LAB — BASIC METABOLIC PANEL WITH GFR
Anion gap: 12 (ref 5–15)
BUN: 25 mg/dL — ABNORMAL HIGH (ref 6–20)
CO2: 26 mmol/L (ref 22–32)
Calcium: 9.5 mg/dL (ref 8.9–10.3)
Chloride: 102 mmol/L (ref 98–111)
Creatinine, Ser: 3.16 mg/dL — ABNORMAL HIGH (ref 0.44–1.00)
GFR, Estimated: 17 mL/min — ABNORMAL LOW (ref >60)
Glucose, Bld: 76 mg/dL (ref 70–99)
Potassium: 3.8 mmol/L (ref 3.5–5.1)
Sodium: 140 mmol/L (ref 135–145)

## 2024-03-14 LAB — TROPONIN T, HIGH SENSITIVITY: Troponin T High Sensitivity: 21 ng/L — ABNORMAL HIGH (ref 0–19)

## 2024-03-14 MED ORDER — SODIUM CHLORIDE 0.9 % IV BOLUS
500.0000 mL | Freq: Once | INTRAVENOUS | Status: AC
  Administered 2024-03-14: 500 mL via INTRAVENOUS

## 2024-03-14 NOTE — ED Notes (Signed)
 Lab called a second time to collect blood work due to patient being a difficult stick

## 2024-03-14 NOTE — ED Provider Notes (Signed)
 [name] Provider Note    Event Date/Time   First MD Initiated Contact with Patient 03/14/24 1339     (approximate)   History   Weakness   HPI  [name] is a 53 y.o. female of ESRD on dialysis, CHF, OSA, hypertension, presenting with lightheadedness.  Patient was at dialysis today, was in her usual state of health prior to dialysis.  States that they were only doing ultrafiltration and not pulling on any fluid.  The tech had decreased the flow rate to half of what it usually was, the blood clotted and they were unable to return the rest of her blood back to her.  States that she felt quite lightheaded after, had some tunnel vision, but now is feeling better.  No chest pain or shortness of breath, no other infectious symptoms.  Per independent history from EMS, patient was in the middle of dialysis, got 2.5 hours of it, was orthostatic for them, blood pressures were normal.  States that she had her rate lowered during dialysis, the blood clotted off and they were unable to return the fluid back to her.     Physical Exam   Triage Vital Signs: ED Triage Vitals  Encounter Vitals Group     BP      Girls Systolic BP Percentile      Girls Diastolic BP Percentile      Boys Systolic BP Percentile      Boys Diastolic BP Percentile      Pulse      Resp      Temp      Temp src      SpO2      Weight      Height      Head Circumference      Peak Flow      Pain Score      Pain Loc      Pain Education      Exclude from Growth Chart     Most recent vital signs: Vitals:   03/14/24 1400 03/14/24 1430  BP:  113/77  Pulse: 83 70  Resp: 19 20  Temp:    SpO2: 100% 100%     General: Awake, no distress.  CV:  Good peripheral perfusion.  Resp:  Normal effort.  No tachypnea or respiratory distress Abd:  No distention.  Soft nontender Other:  Moving all 4 extremities, nontoxic-appearing, left upper extremity AV fistula with palpable  thrill   ED Results / Procedures / Treatments   Labs (all labs ordered are listed, but only abnormal results are displayed) Labs Reviewed  BASIC METABOLIC PANEL WITH GFR  CBC WITH DIFFERENTIAL/PLATELET  TYPE AND SCREEN  TROPONIN T, HIGH SENSITIVITY  TROPONIN T, HIGH SENSITIVITY     EKG  EKG shows, sinus rhythm, rate 78, normal QS, normal QTc, no obvious ischemic ST elevation, baseline is wandering, T wave flattening to V3, T wave inversions to lateral leads, 3 and aVF, not significantly changed compared to prior    PROCEDURES:  Critical Care performed: No  Procedures   MEDICATIONS ORDERED IN ED: Medications  sodium chloride  0.9 % bolus 500 mL (has no administration in time range)     IMPRESSION / MDM / ASSESSMENT AND PLAN / ED COURSE  I reviewed the triage vital signs and the nursing notes.  Differential diagnosis includes, but is not limited to, anemia, electrolyte derangements, arrhythmia, atypical ACS.  Labs, EKG, troponin.  Patient's presentation is most consistent with acute presentation with potential threat to life or bodily function.  Patient signed out pending labs, fluids, reassessment.       FINAL CLINICAL IMPRESSION(S) / ED DIAGNOSES   Final diagnoses:  Lightheadedness  ESRD (end stage renal disease) on dialysis [facility])  Dialysis complication, initial encounter     Rx / DC Orders   ED Discharge Orders     None        Note:  This document was prepared using Dragon voice recognition software and may include unintentional dictation errors.    Waymond Lorelle Cummins, MD 03/14/24 [phone]

## 2024-03-14 NOTE — ED Triage Notes (Signed)
 Pt to ED via [facility] from [facility]. Per EMS, Pt was getting her dialysis treatment and the tech turned the flow down from 350 to 100 causing her blood to clot in the machine and they were not able transfuse all of her blood back into her body. EMS is unsure how much blood was not able to be transfused back into the pt. [facility] reported pts BP dropped into the 60s with them. Pt did have positive orthostatic changes with EMS but otherwise was normotensive.

## 2024-03-14 NOTE — ED Notes (Signed)
 IV access and blood draw attempted by this RN, unable to obtain lab work or IV access. RN called lab and spoke with Oscar and informed her we would need lab to come collect patients lab work.

## 2024-03-14 NOTE — ED Provider Notes (Signed)
 Patient reassessed, hemoglobin is stable, labs are unremarkable, mild elevation of troponin likely related to end-stage renal disease, no chest pain.  She feels well and was ready to go  Orthostatics reassuring, appropriate for discharge   Arlander Charleston, MD 03/14/24 1650

## 2024-03-17 DIAGNOSIS — N186 End stage renal disease: Secondary | ICD-10-CM | POA: Diagnosis not present

## 2024-03-18 ENCOUNTER — Telehealth (INDEPENDENT_AMBULATORY_CARE_PROVIDER_SITE_OTHER): Payer: Self-pay

## 2024-03-18 NOTE — Telephone Encounter (Signed)
 Spoke with the patient and she is scheduled with Dr. Marea for a left arm declot on 03/19/24 with a 2:00 pm arrival time to the [facility]. Pre-procedure instructions were discussed and will be sent to Mychart.

## 2024-03-19 ENCOUNTER — Other Ambulatory Visit: Payer: Self-pay

## 2024-03-19 ENCOUNTER — Ambulatory Visit
Admission: RE | Admit: 2024-03-19 | Discharge: 2024-03-19 | Disposition: A | Discharge status: Home / Self Care | Attending: Vascular Surgery | Admitting: Vascular Surgery

## 2024-03-19 ENCOUNTER — Encounter: Admission: RE | Disposition: A | Payer: Self-pay | Source: Home / Self Care | Attending: Vascular Surgery

## 2024-03-19 DIAGNOSIS — I120 Hypertensive chronic kidney disease with stage 5 chronic kidney disease or end stage renal disease: Secondary | ICD-10-CM | POA: Insufficient documentation

## 2024-03-19 DIAGNOSIS — Z992 Dependence on renal dialysis: Secondary | ICD-10-CM | POA: Diagnosis not present

## 2024-03-19 DIAGNOSIS — N186 End stage renal disease: Secondary | ICD-10-CM

## 2024-03-19 DIAGNOSIS — Y832 Surgical operation with anastomosis, bypass or graft as the cause of abnormal reaction of the patient, or of later complication, without mention of misadventure at the time of the procedure: Secondary | ICD-10-CM | POA: Insufficient documentation

## 2024-03-19 DIAGNOSIS — T82898A Other specified complication of vascular prosthetic devices, implants and grafts, initial encounter: Secondary | ICD-10-CM

## 2024-03-19 DIAGNOSIS — Z79899 Other long term (current) drug therapy: Secondary | ICD-10-CM | POA: Insufficient documentation

## 2024-03-19 HISTORY — PX: A/V SHUNT INTERVENTION: CATH118220

## 2024-03-19 LAB — POTASSIUM (ARMC VASCULAR LAB ONLY): Potassium (ARMC vascular lab): 4.6 mmol/L (ref 3.5–5.1)

## 2024-03-19 SURGERY — A/V SHUNT INTERVENTION
Anesthesia: Moderate Sedation

## 2024-03-19 MED ORDER — FAMOTIDINE 20 MG PO TABS
ORAL_TABLET | ORAL | Status: AC
  Filled 2024-03-19: qty 2

## 2024-03-19 MED ORDER — IODIXANOL 320 MG/ML IV SOLN
INTRAVENOUS | Status: DC | PRN
  Administered 2024-03-19: 20 mL

## 2024-03-19 MED ORDER — METHYLPREDNISOLONE SODIUM SUCC 125 MG IJ SOLR
125.0000 mg | Freq: Once | INTRAMUSCULAR | Status: AC | PRN
  Administered 2024-03-19: 125 mg via INTRAVENOUS

## 2024-03-19 MED ORDER — HYDROMORPHONE HCL 1 MG/ML IJ SOLN: 1.0000 mg | Freq: Once | INTRAMUSCULAR | Status: DC | PRN

## 2024-03-19 MED ORDER — SODIUM CHLORIDE 0.9 % IV SOLN: INTRAVENOUS | Status: DC

## 2024-03-19 MED ORDER — MIDAZOLAM HCL (PF) 2 MG/2ML IJ SOLN
INTRAMUSCULAR | Status: DC | PRN
  Administered 2024-03-19: 2 mg via INTRAVENOUS

## 2024-03-19 MED ORDER — DIPHENHYDRAMINE HCL 50 MG/ML IJ SOLN
INTRAMUSCULAR | Status: AC
  Filled 2024-03-19: qty 1

## 2024-03-19 MED ORDER — ONDANSETRON HCL 4 MG/2ML IJ SOLN: 4.0000 mg | Freq: Four times a day (QID) | INTRAMUSCULAR | Status: DC | PRN

## 2024-03-19 MED ORDER — METHYLPREDNISOLONE SODIUM SUCC 125 MG IJ SOLR
INTRAMUSCULAR | Status: AC
  Filled 2024-03-19: qty 2

## 2024-03-19 MED ORDER — MIDAZOLAM HCL 2 MG/ML PO SYRP: 8.0000 mg | ORAL_SOLUTION | Freq: Once | ORAL | Status: DC | PRN

## 2024-03-19 MED ORDER — CEFAZOLIN SODIUM-DEXTROSE 1-4 GM/50ML-% IV SOLN
1.0000 g | INTRAVENOUS | Status: AC
  Administered 2024-03-19: 1 g via INTRAVENOUS

## 2024-03-19 MED ORDER — HEPARIN (PORCINE) IN NACL 1000-0.9 UT/500ML-% IV SOLN
INTRAVENOUS | Status: DC | PRN
  Administered 2024-03-19: 1000 mL

## 2024-03-19 MED ORDER — HEPARIN SODIUM (PORCINE) 1000 UNIT/ML IJ SOLN
INTRAMUSCULAR | Status: AC
  Filled 2024-03-19: qty 10

## 2024-03-19 MED ORDER — FAMOTIDINE 20 MG PO TABS
40.0000 mg | ORAL_TABLET | Freq: Once | ORAL | Status: AC | PRN
  Administered 2024-03-19: 40 mg via ORAL

## 2024-03-19 MED ORDER — FENTANYL CITRATE (PF) 50 MCG/ML IJ SOSY
PREFILLED_SYRINGE | INTRAMUSCULAR | Status: AC
  Filled 2024-03-19: qty 1

## 2024-03-19 MED ORDER — LIDOCAINE-EPINEPHRINE (PF) 1 %-1:200000 IJ SOLN
INTRAMUSCULAR | Status: DC | PRN
  Administered 2024-03-19: 10 mL

## 2024-03-19 MED ORDER — CEFAZOLIN SODIUM-DEXTROSE 1-4 GM/50ML-% IV SOLN
INTRAVENOUS | Status: AC
  Filled 2024-03-19: qty 50

## 2024-03-19 MED ORDER — DIPHENHYDRAMINE HCL 50 MG/ML IJ SOLN
50.0000 mg | Freq: Once | INTRAMUSCULAR | Status: AC | PRN
  Administered 2024-03-19: 50 mg via INTRAVENOUS

## 2024-03-19 MED ORDER — FENTANYL CITRATE (PF) 100 MCG/2ML IJ SOLN
INTRAMUSCULAR | Status: DC | PRN
  Administered 2024-03-19: 50 ug via INTRAVENOUS

## 2024-03-19 MED ORDER — MIDAZOLAM HCL 2 MG/2ML IJ SOLN
INTRAMUSCULAR | Status: AC
  Filled 2024-03-19: qty 2

## 2024-03-19 SURGICAL SUPPLY — 8 items
COVER PROBE ULTRASOUND 5X96 (MISCELLANEOUS) IMPLANT
NDL ENTRY 21GA 7CM ECHOTIP (NEEDLE) IMPLANT
NEEDLE ENTRY 21GA 7CM ECHOTIP (NEEDLE) ×1 IMPLANT
PACK ANGIOGRAPHY (CUSTOM PROCEDURE TRAY) ×1 IMPLANT
SET INTRO CAPELLA COAXIAL (SET/KITS/TRAYS/PACK) IMPLANT
SHEATH BRITE TIP 6FRX5.5 (SHEATH) IMPLANT
SHEATH BRITE TIP 7FRX5.5 (SHEATH) IMPLANT
SUT MNCRL AB 4-0 PS2 18 (SUTURE) IMPLANT

## 2024-03-19 NOTE — H&P (Addendum)
 [facility] Admission History & Physical  MRN : [id number]  [name] is a 53 y.o. ([birth year]) female who presents with chief complaint of No chief complaint on file. [name]  History of Present Illness: I am asked to evaluate the patient by the dialysis center. The patient was sent here because they were unable to cannulate the AVF this morning. Furthermore the Center states there is no thrill or bruit. The patient states this is the first dialysis run to be missed. This problem is acute in onset and has been present for approximately 2 days. The patient is unaware of any other change.   Patient denies pain or tenderness overlying the access.  There is no pain with dialysis.  The patient denies hand pain or finger pain consistent with steal syndrome.    There have been past interventions or declots of this access.  The patient is not chronically hypotensive on dialysis.  Current Facility-Administered Medications  Medication Dose Route Frequency Provider Last Rate Last Admin   0.9 %  sodium chloride  infusion   Intravenous Continuous Brown, Fallon E, NP       ceFAZolin  (ANCEF ) IVPB 1 g/50 mL premix  1 g Intravenous 30 min Pre-Op  Brown, Fallon E, NP       diphenhydrAMINE  (BENADRYL ) injection 50 mg  50 mg Intravenous Once PRN Brown, Fallon E, NP       famotidine  (PEPCID ) tablet 40 mg  40 mg Oral Once PRN Brown, Fallon E, NP       HYDROmorphone  (DILAUDID ) injection 1 mg  1 mg Intravenous Once PRN Brown, Fallon E, NP       methylPREDNISolone  sodium succinate (SOLU-MEDROL ) 125 mg/2 mL injection 125 mg  125 mg Intravenous Once PRN Brown, Fallon E, NP       midazolam  (VERSED ) 2 MG/ML syrup 8 mg  8 mg Oral Once PRN Brown, Fallon E, NP       ondansetron  (ZOFRAN ) injection 4 mg  4 mg Intravenous Q6H PRN Brown, Fallon E, NP        Past Medical History:  Diagnosis Date   Anemia    Anginal pain    Anxiety    a.) on BZO (diazepam) PRN   Arthritis    Cerebellar stroke  (HCC)    a.) CT head 07/29/2016 --> small old infarct at posterior RIGHT cerebellum   Cholelithiasis    CKD (chronic kidney disease), stage IV (HCC)    Diastasis recti    Diastolic dysfunction 07/31/2016   a.) TTE 07/31/2016: EF 65%, mild LV dil, mod MR, G1DD   Diverticulosis    DOE (dyspnea on exertion)    Fibroid uterus    GERD (gastroesophageal reflux disease)    Heart palpitations    History of left heart catheterization 08/01/2016   a.) LHC 08/01/2016 --> normal coronaries; b.) LHC 04/04/2021: normal coronaries   Hypertension    Long term current use of aspirin     Meningioma (HCC) 07/29/2016   a.) CT head 07/29/2016:  11 x 12 x 11 mm at the high posterior RIGHT parietal region; b.) CT head 01/08/2017: stable at 12 mm   Mild cardiomegaly    NSTEMI (non-ST elevated myocardial infarction) (HCC) 07/29/2016   a.) NSTEMI 07/29/2016: troponins trended 2.54 --> 3.02 --> 3.87 --> 3.80 ng/L; b.) LHC 08/01/2016: normal coronaries   OSA (obstructive sleep apnea)    a.) does not utilize nocturnal PAP therapy   Primary hyperparathyroidism    a.) s/p parathyroidectomy 06/07/2020;  parathyroid  adenoma identifed and resected   PSVT (paroxysmal supraventricular tachycardia)    a.) holter 01/29/2023: 9 runs with the fastest lasting 5 beat   Umbilical hernia     Past Surgical History:  Procedure Laterality Date   A/V FISTULAGRAM Left 02/12/2024   Procedure: A/V Fistulagram;  Surgeon: Jama Cordella MATSU, MD;  Location: [facility] IN[facility]ASIVE [facility];  Service: Cardiovascular;  Laterality: Left;   AV FISTULA PLACEMENT Left 10/20/2022   Procedure: ARTERIOVENOUS (AV) FISTULA CREATION ( BRACHIALCEPHALIC);  Surgeon: Jama Cordella MATSU, MD;  Location: [facility];  Service: Vascular;  Laterality: Left;   CYSTOSCOPY Bilateral 11/06/2016   Procedure: CYSTOSCOPY with stent placement;  Surgeon: Verdon Keen, MD;  Location: [facility];  Service: Gynecology;  Laterality: Bilateral;   FISTULA SUPERFICIALIZATION Left  06/29/2023   Procedure: FISTULA SUPERFICIALIZATION ( POSSIBLE TRANSPOSITION);  Surgeon: Jama Cordella MATSU, MD;  Location: [facility];  Service: Vascular;  Laterality: Left;   HYSTERECTOMY ABDOMINAL WITH SALPINGECTOMY Left 11/06/2016   Procedure: HYSTERECTOMY ABDOMINAL WITH SALPINGECTOMY;  Surgeon: Verdon Keen, MD;  Location: [facility];  Service: Gynecology;  Laterality: Left;   LEFT HEART CATH AND CORONARY ANGIOGRAPHY Right 08/01/2016   Procedure: Left Heart Cath and Coronary Angiography;  Surgeon: Denyse DELENA Bathe, MD;  Location: [facility];  Service: Cardiovascular;  Laterality: Right;   LEFT HEART CATH AND CORONARY ANGIOGRAPHY Left 04/04/2021   Procedure: LEFT HEART CATH AND CORONARY ANGIOGRAPHY; Location: [facility]; Surgeon: Fairy Ship, MD   PARATHYROIDECTOMY N/A 06/07/2020   ROTATOR CUFF REPAIR Left     Social History   Tobacco Use   Smoking status: Never   Smokeless tobacco: Never  Vaping Use   Vaping status: Never Used  Substance Use Topics   Alcohol use: No   Drug use: No     Family History  Problem Relation Age of Onset   CAD Father    Breast cancer Neg Hx     No family history of bleeding or clotting disorders, autoimmune disease or porphyria  Allergies  Allergen Reactions   Shrimp [Shellfish Allergy] Anaphylaxis   Ace Inhibitors Cough   Hydrochlorothiazide  Cough   Latex Swelling and Dermatitis     REVIEW OF SYSTEMS (Negative unless checked)  Constitutional: [] Weight loss  [] Fever  [] Chills Cardiac: [] Chest pain   [] Chest pressure   [] Palpitations   [] Shortness of breath when laying flat   [] Shortness of breath at rest   [x] Shortness of breath with exertion. Vascular:  [] Pain in legs with walking   [] Pain in legs at rest   [] Pain in legs when laying flat   [] Claudication   [] Pain in feet when walking  [] Pain in feet at rest  [] Pain in feet when laying flat   [] History of DVT   [] Phlebitis   [] Swelling in legs   [] Varicose veins   [] Non-healing  ulcers Pulmonary:   [] Uses home oxygen   [] Productive cough   [] Hemoptysis   [] Wheeze  [] COPD   [] Asthma Neurologic:  [] Dizziness  [] Blackouts   [] Seizures   [] History of stroke   [] History of TIA  [] Aphasia   [] Temporary blindness   [] Dysphagia   [] Weakness or numbness in arms   [] Weakness or numbness in legs Musculoskeletal:  [x] Arthritis   [] Joint swelling   [] Joint pain   [] Low back pain Hematologic:  [] Easy bruising  [] Easy bleeding   [] Hypercoagulable state   [x] Anemic  [] Hepatitis Gastrointestinal:  [] Blood in stool   [] Vomiting blood  [] Gastroesophageal reflux/heartburn   [] Difficulty swallowing. Genitourinary:  [x] Chronic  kidney disease   [] Difficult urination  [] Frequent urination  [] Burning with urination   [] Blood in urine Skin:  [] Rashes   [] Ulcers   [] Wounds Psychological:  [] History of anxiety   []  History of major depression.  Physical Examination  There were no vitals filed for this visit. There is no height or weight on file to calculate BMI. Gen: WD/WN, NAD. Morbidly obese Head: [place]/AT, No temporalis wasting.  Ear/Nose/Throat: Hearing grossly intact, nares w/o erythema or drainage, oropharynx w/o Erythema/Exudate,  Eyes: Conjunctiva clear, sclera non-icteric Neck: Trachea midline.  No JVD.  Pulmonary:  Good air movement, respirations not labored, no use of accessory muscles.  Cardiac: RRR, normal S1, S2. Vascular: thrill difficult to palpate in AVF Vessel Right Left  Radial Palpable Palpable   Musculoskeletal: M/S 5/5 throughout.  Extremities without ischemic changes.  No deformity or atrophy.  Neurologic: Sensation grossly intact in extremities.  Symmetrical.  Speech is fluent. Motor exam as listed above. Psychiatric: Judgment intact, Mood & affect appropriate for pt's clinical situation. Dermatologic: No rashes or ulcers noted.  No cellulitis or open wounds. Lymph : No Cervical, Axillary, or Inguinal lymphadenopathy.   CBC Lab Results  Component Value Date   WBC  8.0 03/14/2024   HGB 11.1 (L) 03/14/2024   HCT 36.6 03/14/2024   MCV 93.8 03/14/2024   PLT 305 03/14/2024    BMET    Component Value Date/Time   NA 140 03/14/2024 1515   K 3.8 03/14/2024 1515   CL 102 03/14/2024 1515   CO2 26 03/14/2024 1515   GLUCOSE 76 03/14/2024 1515   BUN 25 (H) 03/14/2024 1515   CREATININE 3.16 (H) 03/14/2024 1515   CALCIUM  9.5 03/14/2024 1515   CALCIUM  9.4 12/21/2020 1145   GFRNONAA 17 (L) 03/14/2024 1515   GFRAA 37 (L) 07/04/2019 1109   Estimated Creatinine Clearance: 29.8 mL/min (A) (by C-G formula based on SCr of 3.16 mg/dL (H)).  COAG Lab Results  Component Value Date   INR 1.00 01/08/2017   INR 0.95 08/01/2016   INR 0.89 07/29/2016    Radiology No results found.  Assessment/Plan 1.  Complication dialysis device with thrombosis AV access:  Patient's left arm dialysis access is thrombosed. The patient will undergo thrombectomy using interventional techniques.  The risks and benefits were described to the patient.  All questions were answered.  The patient agrees to proceed with angiography and intervention. Potassium will be drawn to ensure that it is an appropriate level prior to performing thrombectomy. 2.  End-stage renal disease requiring hemodialysis:  Patient will continue dialysis therapy without further interruption if a successful thrombectomy is not achieved then catheter will be placed. Dialysis has already been arranged since the patient missed their previous session 3.  Hypertension:  Patient will continue medical management; nephrology is following no changes in oral medications.     Selinda Gu, MD  03/19/2024 9:51 AM

## 2024-03-19 NOTE — Op Note (Signed)
 [facility]    OPERATIVE NOTE   PROCEDURE: 1.   Left brachiocephalic arteriovenous fistula cannulation under ultrasound guidance 2.   Left arm fistulagram including central venogram   PRE-OPERATIVE DIAGNOSIS: 1. ESRD 2. Poorly functional, potentially thrombosed left brachiocephalic AVF  POST-OPERATIVE DIAGNOSIS: ESRD, widely patent left brachiocephalic AV fistula  SURGEON: Selinda Gu, MD  ANESTHESIA: local with MCS  ESTIMATED BLOOD LOSS: 3 cc  FINDING(S): Left brachiocephalic fistula was large and widely patent throughout.  There were no focal stenoses or narrowing requiring treatment.  The central venous circulation was widely patent.  SPECIMEN(S):  None  CONTRAST: 20 cc  FLUORO TIME: 0.2 minutes  MODERATE CONSCIOUS SEDATION TIME: Approximately 23 minutes with 2 mg of Versed  and 50 mcg of Fentanyl    INDICATIONS: [name] is a 53 y.o. female who presents with malfunctioning, possibly thrombosed left brachiocephalic arteriovenous fistula.  The patient is scheduled for left arm fistulagram.  The patient is aware the risks include but are not limited to: bleeding, infection, thrombosis of the cannulated access, and possible anaphylactic reaction to the contrast.  The patient is aware of the risks of the procedure and elects to proceed forward.  DESCRIPTION: After full informed written consent was obtained, the patient was brought back to the angiography suite and placed supine upon the angiography table.  The patient was connected to monitoring equipment. Moderate conscious sedation was administered with a face to face encounter with the patient throughout the procedure with my supervision of the RN administering medicines and monitoring the patient's vital signs and mental status throughout from the start of the procedure until the patient was taken to the recovery room. The left arm was prepped and draped in the standard fashion for a percutaneous  access intervention.  Under ultrasound guidance, the left brachiocephalic arteriovenous fistula was cannulated with a micropuncture needle under direct ultrasound guidance where it was patent and a permanent image was performed.  The microwire was advanced into the fistula and the needle was exchanged for the a microsheath.  Hand injections were completed to image the access including the central venous system. This demonstrated that the left brachiocephalic fistula was large and widely patent throughout.  There were no focal stenoses or narrowing requiring treatment.  The central venous circulation was widely patent..  Based on the images, this patient will need no intervention.  A 4-0 Monocryl purse-string suture was sewn around the sheath.  The sheath was removed while tying down the suture.  A sterile bandage was applied to the puncture site.  COMPLICATIONS: None  CONDITION: Stable   Selinda Gu  03/19/2024 11:12 AM   This note was created with Dragon Medical transcription system. Any errors in dictation are purely unintentional.

## 2024-03-20 ENCOUNTER — Encounter: Payer: Self-pay | Admitting: Vascular Surgery

## 2024-03-25 DIAGNOSIS — N186 End stage renal disease: Secondary | ICD-10-CM | POA: Diagnosis not present

## 2024-03-28 DIAGNOSIS — Z992 Dependence on renal dialysis: Secondary | ICD-10-CM | POA: Diagnosis not present

## 2024-03-30 DIAGNOSIS — Z992 Dependence on renal dialysis: Secondary | ICD-10-CM | POA: Diagnosis not present

## 2024-03-30 DIAGNOSIS — N186 End stage renal disease: Secondary | ICD-10-CM | POA: Diagnosis not present

## 2024-04-02 ENCOUNTER — Other Ambulatory Visit (INDEPENDENT_AMBULATORY_CARE_PROVIDER_SITE_OTHER): Payer: Self-pay | Admitting: Vascular Surgery

## 2024-04-02 DIAGNOSIS — N186 End stage renal disease: Secondary | ICD-10-CM

## 2024-04-09 NOTE — Progress Notes (Deleted)
 MRN : [id number]  [name] is a 53 y.o. (1970/11/04) female who presents with chief complaint of check access.  History of Present Illness:     No outpatient medications have been marked as taking for the 04/10/24 encounter (Appointment) with Jama, Cordella MATSU, MD.    Past Medical History:  Diagnosis Date   Anemia    Anginal pain    Anxiety    a.) on BZO (diazepam) PRN   Arthritis    Cerebellar stroke (HCC)    a.) CT head 07/29/2016 --> small old infarct at posterior RIGHT cerebellum   Cholelithiasis    CKD (chronic kidney disease), stage IV (HCC)    Diastasis recti    Diastolic dysfunction 07/31/2016   a.) TTE 07/31/2016: EF 65%, mild LV dil, mod MR, G1DD   Diverticulosis    DOE (dyspnea on exertion)    Fibroid uterus    GERD (gastroesophageal reflux disease)    Heart palpitations    History of left heart catheterization 08/01/2016   a.) LHC 08/01/2016 --> normal coronaries; b.) LHC 04/04/2021: normal coronaries   Hypertension    Long term current use of aspirin     Meningioma (HCC) 07/29/2016   a.) CT head 07/29/2016:  11 x 12 x 11 mm at the high posterior RIGHT parietal region; b.) CT head 01/08/2017: stable at 12 mm   Mild cardiomegaly    NSTEMI (non-ST elevated myocardial infarction) (HCC) 07/29/2016   a.) NSTEMI 07/29/2016: troponins trended 2.54 --> 3.02 --> 3.87 --> 3.80 ng/L; b.) LHC 08/01/2016: normal coronaries   OSA (obstructive sleep apnea)    a.) does not utilize nocturnal PAP therapy   Primary hyperparathyroidism    a.) s/p parathyroidectomy 06/07/2020; parathyroid  adenoma identifed and resected   PSVT (paroxysmal supraventricular tachycardia)    a.) holter 01/29/2023: 9 runs with the fastest lasting 5 beat   Umbilical hernia     Past Surgical History:  Procedure Laterality Date   A/V FISTULAGRAM Left 02/12/2024   Procedure: A/V Fistulagram;  Surgeon: Jama Cordella MATSU, MD;  Location: [facility];  Service:  Cardiovascular;  Laterality: Left;   A/V SHUNT INTERVENTION N/A 03/19/2024   Procedure: A/V SHUNT INTERVENTION;  Surgeon: Marea Selinda RAMAN, MD;  Location: [facility];  Service: Cardiovascular;  Laterality: N/A;   AV FISTULA PLACEMENT Left 10/20/2022   Procedure: ARTERIOVENOUS (AV) FISTULA CREATION ( BRACHIALCEPHALIC);  Surgeon: Jama Cordella MATSU, MD;  Location: [facility];  Service: Vascular;  Laterality: Left;   CYSTOSCOPY Bilateral 11/06/2016   Procedure: CYSTOSCOPY with stent placement;  Surgeon: Verdon Keen, MD;  Location: [facility];  Service: Gynecology;  Laterality: Bilateral;   FISTULA SUPERFICIALIZATION Left 06/29/2023   Procedure: FISTULA SUPERFICIALIZATION ( POSSIBLE TRANSPOSITION);  Surgeon: Jama Cordella MATSU, MD;  Location: [facility];  Service: Vascular;  Laterality: Left;   HYSTERECTOMY ABDOMINAL WITH SALPINGECTOMY Left 11/06/2016   Procedure: HYSTERECTOMY ABDOMINAL WITH SALPINGECTOMY;  Surgeon: Verdon Keen, MD;  Location: [facility];  Service: Gynecology;  Laterality: Left;   LEFT HEART CATH AND CORONARY ANGIOGRAPHY Right 08/01/2016   Procedure: Left Heart Cath and Coronary Angiography;  Surgeon: Denyse DELENA Bathe, MD;  Location: [facility];  Service: Cardiovascular;  Laterality: Right;   LEFT HEART CATH AND CORONARY ANGIOGRAPHY Left 04/04/2021   Procedure: LEFT HEART CATH AND CORONARY ANGIOGRAPHY; Location: [facility]; Surgeon: Fairy Ship, MD   PARATHYROIDECTOMY N/A  06/07/2020   ROTATOR CUFF REPAIR Left     Social History Social History   Tobacco Use   Smoking status: Never   Smokeless tobacco: Never  Vaping Use   Vaping status: Never Used  Substance Use Topics   Alcohol use: No   Drug use: No    Family History Family History  Problem Relation Age of Onset   CAD Father    Breast cancer Neg Hx     Allergies  Allergen Reactions   Shrimp [Shellfish Allergy] Anaphylaxis   Ace Inhibitors Cough   Hydrochlorothiazide  Cough   Latex Swelling and  Dermatitis     REVIEW OF SYSTEMS (Negative unless checked)  Constitutional: [] Weight loss  [] Fever  [] Chills Cardiac: [] Chest pain   [] Chest pressure   [] Palpitations   [] Shortness of breath when laying flat   [] Shortness of breath with exertion. Vascular:  [] Pain in legs with walking   [] Pain in legs at rest  [] History of DVT   [] Phlebitis   [] Swelling in legs   [] Varicose veins   [] Non-healing ulcers Pulmonary:   [] Uses home oxygen   [] Productive cough   [] Hemoptysis   [] Wheeze  [] COPD   [] Asthma Neurologic:  [] Dizziness   [] Seizures   [] History of stroke   [] History of TIA  [] Aphasia   [] Vissual changes   [] Weakness or numbness in arm   [] Weakness or numbness in leg Musculoskeletal:   [] Joint swelling   [] Joint pain   [] Low back pain Hematologic:  [] Easy bruising  [] Easy bleeding   [] Hypercoagulable state   [] Anemic Gastrointestinal:  [] Diarrhea   [] Vomiting  [] Gastroesophageal reflux/heartburn   [] Difficulty swallowing. Genitourinary:  [x] Chronic kidney disease   [] Difficult urination  [] Frequent urination   [] Blood in urine Skin:  [] Rashes   [] Ulcers  Psychological:  [] History of anxiety   []  History of major depression.  Physical Examination  There were no vitals filed for this visit. There is no height or weight on file to calculate BMI. Gen: WD/WN, NAD Head: [place]/AT, No temporalis wasting.  Ear/Nose/Throat: Hearing grossly intact, nares w/o erythema or drainage Eyes: PER, EOMI, sclera nonicteric.  Neck: Supple, no gross masses or lesions.  No JVD.  Pulmonary:  Good air movement, no audible wheezing, no use of accessory muscles.  Cardiac: RRR, precordium non-hyperdynamic. Vascular:   *** Vessel Right Left  Radial Palpable Palpable  Brachial Palpable Palpable  Gastrointestinal: soft, non-distended. No guarding/no peritoneal signs.  Musculoskeletal: M/S 5/5 throughout.  No deformity.  Neurologic: CN 2-12 intact. Pain and light touch intact in extremities.  Symmetrical.  Speech  is fluent. Motor exam as listed above. Psychiatric: Judgment intact, Mood & affect appropriate for pt's clinical situation. Dermatologic: No rashes or ulcers noted.  No changes consistent with cellulitis.   CBC Lab Results  Component Value Date   WBC 8.0 03/14/2024   HGB 11.1 (L) 03/14/2024   HCT 36.6 03/14/2024   MCV 93.8 03/14/2024   PLT 305 03/14/2024    BMET    Component Value Date/Time   NA 140 03/14/2024 1515   K 3.8 03/14/2024 1515   CL 102 03/14/2024 1515   CO2 26 03/14/2024 1515   GLUCOSE 76 03/14/2024 1515   BUN 25 (H) 03/14/2024 1515   CREATININE 3.16 (H) 03/14/2024 1515   CALCIUM  9.5 03/14/2024 1515   CALCIUM  9.4 12/21/2020 1145   GFRNONAA 17 (L) 03/14/2024 1515   GFRAA 37 (L) 07/04/2019 1109   CrCl cannot be calculated (Patient's most recent lab result is older than  the maximum 21 days allowed.).  COAG Lab Results  Component Value Date   INR 1.00 01/08/2017   INR 0.95 08/01/2016   INR 0.89 07/29/2016    Radiology PERIPHERAL VASCULAR CATHETERIZATION Result Date: 03/19/2024 See surgical note for result.    Assessment/Plan There are no diagnoses linked to this encounter.   Cordella Shawl, MD  04/09/2024 10:28 AM

## 2024-04-10 ENCOUNTER — Ambulatory Visit (INDEPENDENT_AMBULATORY_CARE_PROVIDER_SITE_OTHER): Admitting: Vascular Surgery

## 2024-04-10 ENCOUNTER — Encounter (INDEPENDENT_AMBULATORY_CARE_PROVIDER_SITE_OTHER)

## 2024-04-11 DIAGNOSIS — R32 Unspecified urinary incontinence: Secondary | ICD-10-CM | POA: Diagnosis not present

## 2024-04-17 ENCOUNTER — Other Ambulatory Visit (INDEPENDENT_AMBULATORY_CARE_PROVIDER_SITE_OTHER)

## 2024-04-17 DIAGNOSIS — N186 End stage renal disease: Secondary | ICD-10-CM

## 2024-04-22 ENCOUNTER — Ambulatory Visit (INDEPENDENT_AMBULATORY_CARE_PROVIDER_SITE_OTHER): Admitting: Nurse Practitioner

## 2024-04-28 ENCOUNTER — Ambulatory Visit (INDEPENDENT_AMBULATORY_CARE_PROVIDER_SITE_OTHER): Admitting: Vascular Surgery

## 2024-04-28 ENCOUNTER — Encounter (INDEPENDENT_AMBULATORY_CARE_PROVIDER_SITE_OTHER)

## 2024-04-29 ENCOUNTER — Encounter (INDEPENDENT_AMBULATORY_CARE_PROVIDER_SITE_OTHER): Payer: Self-pay | Admitting: Nurse Practitioner

## 2024-04-29 ENCOUNTER — Ambulatory Visit (INDEPENDENT_AMBULATORY_CARE_PROVIDER_SITE_OTHER): Admitting: Nurse Practitioner

## 2024-04-29 VITALS — BP 123/77 | HR 74 | Ht 63.0 in | Wt 328.4 lb

## 2024-04-29 DIAGNOSIS — N186 End stage renal disease: Secondary | ICD-10-CM | POA: Diagnosis not present

## 2024-04-29 DIAGNOSIS — I10 Essential (primary) hypertension: Secondary | ICD-10-CM

## 2024-04-29 DIAGNOSIS — E782 Mixed hyperlipidemia: Secondary | ICD-10-CM

## 2024-05-04 ENCOUNTER — Encounter (INDEPENDENT_AMBULATORY_CARE_PROVIDER_SITE_OTHER): Payer: Self-pay | Admitting: Nurse Practitioner

## 2024-05-04 NOTE — Progress Notes (Signed)
 "  Subjective:    Patient ID: [name], female    DOB: 1970/09/02, 54 y.o.   MRN: [id number] Chief Complaint  Patient presents with   Routine Post Op     1 month (03/13/2024); follow up after procedure with an HDA     HPI  Discussed the use of AI scribe software for clinical note transcription with the patient, who gave verbal consent to proceed.  History of Present Illness [name] is a 54 year old female with end-stage renal disease on hemodialysis who presents for vascular access follow-up after recent procedure.  She underwent a vascular access procedure in November 2025. Since the procedure, her fistula has demonstrated improved function, which she attributes to limiting the number of staff performing cannulation. She denies ongoing bleeding from the access site.  Following a dialysis session on Friday, she waited four hours before removing her bandages and noted a small blood clot at the site, which persisted until the next day without further complications.  Her most recent ultrasound showed a flow volume of 3473, compared to 2688 in September 2025.  She takes daily aspirin  and withholds antihypertensive medications and furosemide on dialysis days.  On March 14, 2024, she experienced a hypotensive episode during dialysis with systolic blood pressure dropping to 49 mmHg, necessitating emergency room evaluation. Her blood pressure recovered without overnight admission. She recalls undergoing thrombectomy a few days after this event.  She continues to have challenges with dialysis scheduling and transportation, as her current seat time results in late departures after 4 PM, conflicting with transportation availability. She is seeking a morning dialysis slot.    Results Diagnostic Vascular access ultrasound (03/2024): Flow volume 3473 mL/min, no significant stenosis; increased from flow volume 2688 mL/min on 01/23/2024   Review of Systems  Hematological:   Bruises/bleeds easily.  All other systems reviewed and are negative.      Objective:   Physical Exam Vitals reviewed.  HENT:     Head: Normocephalic.  Cardiovascular:     Rate and Rhythm: Normal rate.     Pulses: Normal pulses.  Pulmonary:     Effort: Pulmonary effort is normal.  Skin:    General: Skin is warm and dry.  Neurological:     Mental Status: She is alert and oriented to person, place, and time.  Psychiatric:        Mood and Affect: Mood normal.        Behavior: Behavior normal.        Thought Content: Thought content normal.        Judgment: Judgment normal.     Physical Exam CARDIOVASCULAR: Strong thrill and bruit present.  BP 123/77 (BP Location: Right Wrist, Patient Position: Sitting, Cuff Size: Large)   Pulse 74   Ht 5' 3 (1.6 m)   Wt (!) 328 lb 6.4 oz (149 kg)   LMP  (LMP Unknown)   BMI 58.17 kg/m   Past Medical History:  Diagnosis Date   Anemia    Anginal pain    Anxiety    a.) on BZO (diazepam) PRN   Arthritis    Cerebellar stroke (HCC)    a.) CT head 07/29/2016 --> small old infarct at posterior RIGHT cerebellum   Cholelithiasis    CKD (chronic kidney disease), stage IV (HCC)    Diastasis recti    Diastolic dysfunction 07/31/2016   a.) TTE 07/31/2016: EF 65%, mild LV dil, mod MR, G1DD   Diverticulosis    DOE (  dyspnea on exertion)    Fibroid uterus    GERD (gastroesophageal reflux disease)    Heart palpitations    History of left heart catheterization 08/01/2016   a.) LHC 08/01/2016 --> normal coronaries; b.) LHC 04/04/2021: normal coronaries   Hypertension    Long term current use of aspirin     Meningioma (HCC) 07/29/2016   a.) CT head 07/29/2016:  11 x 12 x 11 mm at the high posterior RIGHT parietal region; b.) CT head 01/08/2017: stable at 12 mm   Mild cardiomegaly    NSTEMI (non-ST elevated myocardial infarction) (HCC) 07/29/2016   a.) NSTEMI 07/29/2016: troponins trended 2.54 --> 3.02 --> 3.87 --> 3.80 ng/L; b.) LHC 08/01/2016:  normal coronaries   OSA (obstructive sleep apnea)    a.) does not utilize nocturnal PAP therapy   Primary hyperparathyroidism    a.) s/p parathyroidectomy 06/07/2020; parathyroid  adenoma identifed and resected   PSVT (paroxysmal supraventricular tachycardia)    a.) holter 01/29/2023: 9 runs with the fastest lasting 5 beat   Umbilical hernia     Social History   Socioeconomic History   Marital status: Single    Spouse name: Not on file   Number of children: Not on file   Years of education: Not on file   Highest education level: Not on file  Occupational History   Not on file  Tobacco Use   Smoking status: Never   Smokeless tobacco: Never  Vaping Use   Vaping status: Never Used  Substance and Sexual Activity   Alcohol use: No   Drug use: No   Sexual activity: Not on file  Other Topics Concern   Not on file  Social History Narrative   Not on file   Social Drivers of Health   Tobacco Use: Low Risk (04/29/2024)   Patient History    Smoking Tobacco Use: Never    Smokeless Tobacco Use: Never    Passive Exposure: Not on file  Financial Resource Strain: Low Risk  (10/29/2023)   Received from [facility]   Overall Financial Resource Strain (CARDIA)    Difficulty of Paying Living Expenses: Not hard at all  Food Insecurity: No Food Insecurity (10/29/2023)   Received from [facility]   Epic    Within the past 12 months, you worried that your food would run out before you got the money to buy more.: Never true    Within the past 12 months, the food you bought just didn't last and you didn't have money to get more.: Never true  Transportation Needs: No Transportation Needs (10/29/2023)   Received from [facility] - Transportation    In the past 12 months, has lack of transportation kept you from medical appointments or from getting medications?: No    Lack of Transportation (Non-Medical): No  Physical Activity:  Inactive (06/09/2021)   Received from [facility]   Exercise Vital Sign    On average, how many days per week do you engage in moderate to strenuous exercise (like a brisk walk)?: 0 days    On average, how many minutes do you engage in exercise at this level?: 0 min  Stress: No Stress Concern Present (06/09/2021)   Received from [facility] - Occupational Stress Questionnaire    Feeling of Stress : Not at all  Social Connections: Moderately Isolated (06/09/2021)   Received from [facility]   Social  Connection and Isolation Panel    In a typical week, how many times do you talk on the phone with family, friends, or neighbors?: More than three times a week    How often do you get together with friends or relatives?: More than three times a week    How often do you attend church or religious services?: 1 to 4 times per year    Do you belong to any clubs or organizations such as church groups, unions, fraternal or athletic groups, or school groups?: No    How often do you attend meetings of the clubs or organizations you belong to?: Never    Are you married, widowed, divorced, separated, never married, or living with a partner?: Never married  Intimate Partner Violence: Not At Risk (06/09/2021)   Received from [facility]   Epic    Within the last year, have you been afraid of your partner or ex-partner?: No    Within the last year, have you been humiliated or emotionally abused in other ways by your partner or ex-partner?: No    Within the last year, have you been kicked, hit, slapped, or otherwise physically hurt by your partner or ex-partner?: No    Within the last year, have you been raped or forced to have any kind of sexual activity by your partner or ex-partner?: No  Depression (PHQ2-9): Not on file  Alcohol Screen: Not on file  Housing: Low Risk  (10/29/2023)   Received from [facility]   Epic    In the last 12  months, was there a time when you were not able to pay the mortgage or rent on time?: No    In the past 12 months, how many times have you moved where you were living?: 0    At any time in the past 12 months, were you homeless or living in a shelter (including now)?: No  Utilities: Not At Risk (10/29/2023)   Received from [facility]   Epic    In the past 12 months has the electric, gas, oil, or water company threatened to shut off services in your home?: No  Health Literacy: Low Risk (06/09/2021)   Received from [facility]    How often do you need to have someone help you when you read instructions, pamphlets, or other written material from your doctor or pharmacy?: Never    Past Surgical History:  Procedure Laterality Date   A/V FISTULAGRAM Left 02/12/2024   Procedure: A/V Fistulagram;  Surgeon: Jama Cordella MATSU, MD;  Location: [facility] INVASIVE [facility];  Service: Cardiovascular;  Laterality: Left;   A/V SHUNT INTERVENTION N/A 03/19/2024   Procedure: A/V SHUNT INTERVENTION;  Surgeon: Marea Selinda RAMAN, MD;  Location: [facility];  Service: Cardiovascular;  Laterality: N/A;   AV FISTULA PLACEMENT Left 10/20/2022   Procedure: ARTERIOVENOUS (AV) FISTULA CREATION ( BRACHIALCEPHALIC);  Surgeon: Jama Cordella MATSU, MD;  Location: [facility] ORS;  Service: Vascular;  Laterality: Left;   CYSTOSCOPY Bilateral 11/06/2016   Procedure: CYSTOSCOPY with stent placement;  Surgeon: Verdon Keen, MD;  Location: [facility] ORS;  Service: Gynecology;  Laterality: Bilateral;   FISTULA SUPERFICIALIZATION Left 06/29/2023   Procedure: FISTULA SUPERFICIALIZATION ( POSSIBLE TRANSPOSITION);  Surgeon: Jama Cordella MATSU, MD;  Location: [facility] OR[facility];  Service: Vascular;  Laterality: Left;   HYSTERECTOMY ABDOMINAL WITH SALPINGECTOMY Left 11/06/2016   Procedure: HYSTERECTOMY ABDOMINAL WITH SALPINGECTOMY;  Surgeon: Verdon Keen, MD;  Location:  [facility] ORS;  Service: Gynecology;  Laterality:  Left;   LEFT HEART CATH AND CORONARY ANGIOGRAPHY Right 08/01/2016   Procedure: Left Heart Cath and Coronary Angiography;  Surgeon: Denyse DELENA Bathe, MD;  Location: [facility];  Service: Cardiovascular;  Laterality: Right;   LEFT HEART CATH AND CORONARY ANGIOGRAPHY Left 04/04/2021   Procedure: LEFT HEART CATH AND CORONARY ANGIOGRAPHY; Location: [facility]; Surgeon: Fairy Ship, MD   PARATHYROIDECTOMY N/A 06/07/2020   ROTATOR CUFF REPAIR Left     Family History  Problem Relation Age of Onset   CAD Father    Breast cancer Neg Hx     Allergies[1]     Latest Ref Rng & Units 03/14/2024    3:15 PM 06/18/2023    8:55 AM 10/20/2022   11:08 AM  CBC  WBC 4.0 - 10.5 K/uL 8.0  6.0    Hemoglobin 12.0 - 15.0 g/dL 88.8  88.4  87.0   Hematocrit 36.0 - 46.0 % 36.6  37.1  38.0   Platelets 150 - 400 K/uL 305  261        CMP     Component Value Date/Time   NA 140 03/14/2024 1515   K 3.8 03/14/2024 1515   CL 102 03/14/2024 1515   CO2 26 03/14/2024 1515   GLUCOSE 76 03/14/2024 1515   BUN 25 (H) 03/14/2024 1515   CREATININE 3.16 (H) 03/14/2024 1515   CALCIUM  9.5 03/14/2024 1515   CALCIUM  9.4 12/21/2020 1145   PROT 7.6 07/24/2020 0808   ALBUMIN 3.5 03/21/2021 1041   AST 18 07/24/2020 0808   ALT 18 07/24/2020 0808   ALKPHOS 114 07/24/2020 0808   BILITOT 0.7 07/24/2020 0808   GFRNONAA 17 (L) 03/14/2024 1515     No results found.     Assessment & Plan:   1. ESRD (end stage renal disease) (HCC) (Primary) End stage renal disease End stage renal disease on hemodialysis with functioning arteriovenous fistula. Recurrent clot formation during dialysis, with a significant hypotensive episode in November possibly contributing to access thrombosis. Recent ultrasound showed improved flow volume without significant stenosis. - Scheduled follow-up in 3 months due to recurrent clotting and prior thrombectomy. - Advised her to notify the clinic if any new access problems arise. - Reinforced the  importance of monitoring blood pressure during dialysis to avoid hypotension. - Discussed that anticoagulation (e.g., Eliquis) may be initiated if clotting worsens or another thrombectomy is required. - Reinforced holding antihypertensives and furosemide on dialysis days. - Reviewed recent ultrasound findings confirming strong flow and absence of significant stenosis. - VAS US  DUPLEX DIALYSIS ACCESS (AVF, AVG); Future   2. Essential hypertension Continue antihypertensive medications as already ordered, these medications have been reviewed and there are no changes at this time.  3. Mixed hyperlipidemia Continue statin as ordered and reviewed, no changes at this time    Assessment and Plan Assessment & Plan      Medications Ordered Prior to Encounter[2]  There are no Patient Instructions on file for this visit. Return in about 3 months (around 07/28/2024) for w HDA, GS/FB.   Elige Shouse E Domique Reardon, NP      [1]  Allergies Allergen Reactions   Shrimp [Shellfish Allergy] Anaphylaxis   Ace Inhibitors Cough   Hydrochlorothiazide  Cough   Latex Swelling and Dermatitis  [2]  Current Outpatient Medications on File Prior to Visit  Medication Sig Dispense Refill   amLODipine  (NORVASC ) 10 MG tablet Take 10 mg by mouth in the morning.  aspirin  EC 81 MG tablet Take 81 mg by mouth in the morning. Swallow whole.     atorvastatin  (LIPITOR) 40 MG tablet Take 1 tablet (40 mg total) by mouth daily at 6 PM. (Patient taking differently: Take 80 mg by mouth at bedtime.) 30 tablet 2   Cholecalciferol (VITAMIN D3) 50 MCG (2000 UT) capsule Take 2,000 Units by mouth in the morning.     febuxostat (ULORIC) 40 MG tablet Take 40 mg by mouth in the morning.     furosemide (LASIX) 20 MG tablet Take 20 mg by mouth in the morning. Skip on dialysis days     gabapentin  (NEURONTIN ) 300 MG capsule Take 300 mg by mouth daily.     hydrALAZINE  (APRESOLINE ) 50 MG tablet Take 50 mg by mouth daily.     hydrOXYzine  (ATARAX) 25 MG tablet Take 25 mg by mouth every Monday, Wednesday, and Friday. Take on dialysis days     losartan  (COZAAR ) 50 MG tablet Take 50 mg by mouth in the morning.     metoprolol  succinate (TOPROL -XL) 50 MG 24 hr tablet Take 50 mg by mouth in the morning.     nortriptyline (PAMELOR) 10 MG capsule Take 30 mg by mouth at bedtime.     omeprazole (PRILOSEC) 20 MG capsule Take 20 mg by mouth daily before breakfast.     traMADol  (ULTRAM ) 50 MG tablet Take 1 tablet (50 mg total) by mouth every 6 (six) hours as needed. 20 tablet 0   No current facility-administered medications on file prior to visit.   "

## 2024-07-29 ENCOUNTER — Encounter (INDEPENDENT_AMBULATORY_CARE_PROVIDER_SITE_OTHER)

## 2024-07-29 ENCOUNTER — Ambulatory Visit (INDEPENDENT_AMBULATORY_CARE_PROVIDER_SITE_OTHER): Admitting: Nurse Practitioner
